# Patient Record
Sex: Female | Born: 1980 | Race: Black or African American | Hispanic: No | Marital: Married | Smoking: Never smoker
Health system: Southern US, Community
[De-identification: ages and names within clinical notes are randomized; demographics above are authoritative.]

## PROBLEM LIST (undated history)

## (undated) DIAGNOSIS — M419 Scoliosis, unspecified: Secondary | ICD-10-CM

## (undated) DIAGNOSIS — R000 Tachycardia, unspecified: Secondary | ICD-10-CM

## (undated) DIAGNOSIS — G40909 Epilepsy, unspecified, not intractable, without status epilepticus: Secondary | ICD-10-CM

## (undated) DIAGNOSIS — D219 Benign neoplasm of connective and other soft tissue, unspecified: Secondary | ICD-10-CM

## (undated) HISTORY — PX: GALLBLADDER SURGERY: SHX652

## (undated) HISTORY — PX: CHOLECYSTECTOMY: SHX55

## (undated) HISTORY — DX: Benign neoplasm of connective and other soft tissue, unspecified: D21.9

---

## 2004-07-18 ENCOUNTER — Emergency Department: Payer: Self-pay | Admitting: Emergency Medicine

## 2005-05-22 ENCOUNTER — Emergency Department: Payer: Self-pay | Admitting: Emergency Medicine

## 2007-02-22 ENCOUNTER — Emergency Department: Payer: Self-pay | Admitting: Internal Medicine

## 2007-02-27 ENCOUNTER — Emergency Department: Payer: Self-pay | Admitting: Unknown Physician Specialty

## 2007-02-28 ENCOUNTER — Inpatient Hospital Stay: Payer: Self-pay | Admitting: Obstetrics and Gynecology

## 2007-05-13 ENCOUNTER — Encounter: Payer: Self-pay | Admitting: Maternal and Fetal Medicine

## 2007-06-06 ENCOUNTER — Encounter: Payer: Self-pay | Admitting: Maternal & Fetal Medicine

## 2007-06-08 ENCOUNTER — Observation Stay: Payer: Self-pay | Admitting: Obstetrics and Gynecology

## 2007-07-17 ENCOUNTER — Observation Stay: Payer: Self-pay | Admitting: Unknown Physician Specialty

## 2007-07-18 ENCOUNTER — Observation Stay: Payer: Self-pay | Admitting: Obstetrics & Gynecology

## 2007-08-02 ENCOUNTER — Observation Stay: Payer: Self-pay

## 2007-08-23 ENCOUNTER — Observation Stay: Payer: Self-pay

## 2007-09-17 ENCOUNTER — Inpatient Hospital Stay: Payer: Self-pay

## 2007-11-27 ENCOUNTER — Emergency Department: Payer: Self-pay | Admitting: Emergency Medicine

## 2008-03-27 ENCOUNTER — Emergency Department: Payer: Self-pay | Admitting: Emergency Medicine

## 2008-09-17 ENCOUNTER — Emergency Department: Payer: Self-pay | Admitting: Emergency Medicine

## 2008-11-02 ENCOUNTER — Ambulatory Visit: Payer: Self-pay | Admitting: Family Medicine

## 2009-02-06 ENCOUNTER — Emergency Department: Payer: Self-pay | Admitting: Emergency Medicine

## 2009-04-05 ENCOUNTER — Emergency Department: Payer: Self-pay | Admitting: Emergency Medicine

## 2009-06-27 ENCOUNTER — Observation Stay: Payer: Self-pay

## 2009-07-19 ENCOUNTER — Observation Stay: Payer: Self-pay | Admitting: Obstetrics & Gynecology

## 2009-08-05 ENCOUNTER — Observation Stay: Payer: Self-pay

## 2009-08-13 ENCOUNTER — Ambulatory Visit: Payer: Self-pay | Admitting: Oncology

## 2009-08-17 ENCOUNTER — Observation Stay: Payer: Self-pay | Admitting: Obstetrics & Gynecology

## 2009-08-18 ENCOUNTER — Ambulatory Visit: Payer: Self-pay

## 2009-08-19 ENCOUNTER — Observation Stay: Payer: Self-pay

## 2009-08-24 ENCOUNTER — Observation Stay: Payer: Self-pay

## 2009-08-30 ENCOUNTER — Observation Stay: Payer: Self-pay

## 2009-09-02 ENCOUNTER — Ambulatory Visit: Payer: Self-pay | Admitting: Oncology

## 2009-09-03 ENCOUNTER — Observation Stay: Payer: Self-pay

## 2009-09-13 ENCOUNTER — Ambulatory Visit: Payer: Self-pay | Admitting: Oncology

## 2009-09-16 ENCOUNTER — Observation Stay: Payer: Self-pay | Admitting: Obstetrics & Gynecology

## 2009-09-22 ENCOUNTER — Inpatient Hospital Stay: Payer: Self-pay

## 2009-09-25 ENCOUNTER — Ambulatory Visit: Payer: Self-pay | Admitting: Oncology

## 2009-10-14 ENCOUNTER — Ambulatory Visit: Payer: Self-pay | Admitting: Oncology

## 2010-07-29 ENCOUNTER — Ambulatory Visit: Payer: Self-pay | Admitting: Internal Medicine

## 2010-10-05 ENCOUNTER — Emergency Department: Payer: Self-pay | Admitting: *Deleted

## 2011-01-18 ENCOUNTER — Emergency Department: Payer: Self-pay | Admitting: Emergency Medicine

## 2011-02-15 ENCOUNTER — Ambulatory Visit: Payer: Self-pay | Admitting: Surgery

## 2011-02-15 LAB — BASIC METABOLIC PANEL
Anion Gap: 12 (ref 7–16)
BUN: 8 mg/dL (ref 7–18)
Calcium, Total: 9.3 mg/dL (ref 8.5–10.1)
EGFR (African American): >60
EGFR (Non-African Amer.): >60
Glucose: 80 mg/dL (ref 65–99)
Osmolality: 284 (ref 275–301)
Sodium: 144 mmol/L (ref 136–145)

## 2011-02-15 LAB — CBC WITH DIFFERENTIAL/PLATELET
Basophil %: 0.8 %
Eosinophil #: 0 10*3/uL (ref 0.0–0.7)
Eosinophil %: 0.2 %
HCT: 33.9 % — ABNORMAL LOW (ref 35.0–47.0)
HGB: 11.3 g/dL — ABNORMAL LOW (ref 12.0–16.0)
MCH: 28.4 pg (ref 26.0–34.0)
MCV: 85 fL (ref 80–100)
Monocyte %: 5.5 %
Neutrophil #: 4.4 10*3/uL (ref 1.4–6.5)
Platelet: 231 10*3/uL (ref 150–440)
RBC: 3.99 10*6/uL (ref 3.80–5.20)
RDW: 13.1 % (ref 11.5–14.5)
WBC: 6.1 10*3/uL (ref 3.6–11.0)

## 2011-02-15 LAB — PREGNANCY, URINE: Pregnancy Test, Urine: NEGATIVE m[IU]/mL

## 2011-02-21 ENCOUNTER — Ambulatory Visit: Payer: Self-pay | Admitting: Surgery

## 2011-02-22 LAB — PATHOLOGY REPORT

## 2011-06-08 ENCOUNTER — Ambulatory Visit: Payer: Self-pay

## 2011-06-11 ENCOUNTER — Emergency Department: Payer: Self-pay | Admitting: Emergency Medicine

## 2011-06-11 LAB — URINALYSIS, COMPLETE
Glucose,UR: NEGATIVE mg/dL (ref 0–75)
Leukocyte Esterase: NEGATIVE
Nitrite: NEGATIVE
Ph: 5 (ref 4.5–8.0)
Protein: NEGATIVE
RBC,UR: 1 /HPF (ref 0–5)
Specific Gravity: 1.029 (ref 1.003–1.030)
Squamous Epithelial: NONE SEEN

## 2011-06-11 LAB — COMPREHENSIVE METABOLIC PANEL
Albumin: 4.3 g/dL (ref 3.4–5.0)
Alkaline Phosphatase: 48 U/L — ABNORMAL LOW (ref 50–136)
Anion Gap: 6 — ABNORMAL LOW (ref 7–16)
BUN: 13 mg/dL (ref 7–18)
Bilirubin,Total: 0.4 mg/dL (ref 0.2–1.0)
Calcium, Total: 9.2 mg/dL (ref 8.5–10.1)
Chloride: 105 mmol/L (ref 98–107)
Creatinine: 0.89 mg/dL (ref 0.60–1.30)
EGFR (African American): >60
EGFR (Non-African Amer.): >60
Glucose: 84 mg/dL (ref 65–99)
Osmolality: 275 (ref 275–301)
Potassium: 4.2 mmol/L (ref 3.5–5.1)
SGOT(AST): 19 U/L (ref 15–37)
SGPT (ALT): 13 U/L
Sodium: 138 mmol/L (ref 136–145)
Total Protein: 8.5 g/dL — ABNORMAL HIGH (ref 6.4–8.2)

## 2011-06-11 LAB — CBC
MCH: 27.6 pg (ref 26.0–34.0)
MCHC: 31.7 g/dL — ABNORMAL LOW (ref 32.0–36.0)
Platelet: 302 10*3/uL (ref 150–440)
RBC: 4.66 10*6/uL (ref 3.80–5.20)
WBC: 5.9 10*3/uL (ref 3.6–11.0)

## 2011-06-11 LAB — LIPASE, BLOOD: Lipase: 191 U/L (ref 73–393)

## 2011-06-11 LAB — PREGNANCY, URINE: Pregnancy Test, Urine: NEGATIVE m[IU]/mL

## 2011-07-12 ENCOUNTER — Emergency Department: Payer: Self-pay | Admitting: Emergency Medicine

## 2011-07-12 LAB — CBC
HCT: 36.8 % (ref 35.0–47.0)
HGB: 12.1 g/dL (ref 12.0–16.0)
MCH: 28.2 pg (ref 26.0–34.0)
MCHC: 33 g/dL (ref 32.0–36.0)
MCV: 86 fL (ref 80–100)
Platelet: 288 10*3/uL (ref 150–440)
WBC: 4.3 10*3/uL (ref 3.6–11.0)

## 2011-07-12 LAB — COMPREHENSIVE METABOLIC PANEL
Albumin: 4 g/dL (ref 3.4–5.0)
Alkaline Phosphatase: 48 U/L — ABNORMAL LOW (ref 50–136)
Anion Gap: 7 (ref 7–16)
Calcium, Total: 9 mg/dL (ref 8.5–10.1)
Co2: 26 mmol/L (ref 21–32)
Creatinine: 0.93 mg/dL (ref 0.60–1.30)
Glucose: 97 mg/dL (ref 65–99)
Osmolality: 277 (ref 275–301)
Potassium: 4.3 mmol/L (ref 3.5–5.1)
Sodium: 139 mmol/L (ref 136–145)
Total Protein: 8 g/dL (ref 6.4–8.2)

## 2011-07-12 LAB — URINALYSIS, COMPLETE
Bilirubin,UR: NEGATIVE
Glucose,UR: NEGATIVE mg/dL (ref 0–75)
Leukocyte Esterase: NEGATIVE
Nitrite: NEGATIVE
Protein: NEGATIVE
Specific Gravity: 1.029 (ref 1.003–1.030)
WBC UR: 1 /HPF (ref 0–5)

## 2011-07-12 LAB — CK TOTAL AND CKMB (NOT AT ARMC)
CK, Total: 84 U/L (ref 21–215)
CK-MB: <0.5 ng/mL — ABNORMAL LOW (ref 0.5–3.6)

## 2011-07-12 LAB — TROPONIN I: Troponin-I: <0.02 ng/mL

## 2012-05-02 ENCOUNTER — Ambulatory Visit: Payer: Self-pay

## 2012-05-02 LAB — URINALYSIS, COMPLETE
Bilirubin,UR: NEGATIVE
Blood: NEGATIVE
Glucose,UR: NEGATIVE mg/dL (ref 0–75)
Ketone: NEGATIVE
Leukocyte Esterase: NEGATIVE
Nitrite: NEGATIVE

## 2012-05-02 LAB — PREGNANCY, URINE: Pregnancy Test, Urine: POSITIVE m[IU]/mL

## 2012-08-17 ENCOUNTER — Ambulatory Visit: Payer: Self-pay

## 2012-08-17 LAB — URINALYSIS, COMPLETE
Glucose,UR: NEGATIVE mg/dL (ref 0–75)
Leukocyte Esterase: NEGATIVE
Nitrite: NEGATIVE
Protein: NEGATIVE
Specific Gravity: 1.01 (ref 1.003–1.030)

## 2012-08-19 LAB — URINE CULTURE

## 2012-08-31 ENCOUNTER — Observation Stay: Payer: Self-pay

## 2012-08-31 LAB — URINALYSIS, COMPLETE
Bacteria: NONE SEEN
Blood: NEGATIVE
Glucose,UR: NEGATIVE mg/dL (ref 0–75)
Ph: 7 (ref 4.5–8.0)
RBC,UR: <1 /HPF (ref 0–5)
Specific Gravity: 1.005 (ref 1.003–1.030)
Squamous Epithelial: 2
WBC UR: <1 /HPF (ref 0–5)

## 2012-10-20 ENCOUNTER — Observation Stay: Payer: Self-pay

## 2012-10-20 LAB — URINALYSIS, COMPLETE
Blood: NEGATIVE
Ketone: NEGATIVE
Leukocyte Esterase: NEGATIVE
Ph: 7 (ref 4.5–8.0)
RBC,UR: NONE SEEN /HPF (ref 0–5)
Squamous Epithelial: 1

## 2012-10-21 LAB — FETAL FIBRONECTIN
Appearance: NORMAL
Fetal Fibronectin: NEGATIVE

## 2012-11-15 ENCOUNTER — Observation Stay: Payer: Self-pay | Admitting: Obstetrics and Gynecology

## 2012-12-09 ENCOUNTER — Observation Stay: Payer: Self-pay | Admitting: Obstetrics and Gynecology

## 2012-12-10 ENCOUNTER — Inpatient Hospital Stay: Payer: Self-pay

## 2012-12-10 LAB — CBC WITH DIFFERENTIAL/PLATELET
Eosinophil: 1 %
HGB: 11.8 g/dL — ABNORMAL LOW (ref 12.0–16.0)
Lymphocytes: 15 %
MCH: 29.4 pg (ref 26.0–34.0)
MCHC: 34.5 g/dL (ref 32.0–36.0)
MCV: 85 fL (ref 80–100)
Monocytes: 6 %
Platelet: 217 10*3/uL (ref 150–440)
RBC: 4.01 10*6/uL (ref 3.80–5.20)
RDW: 13.3 % (ref 11.5–14.5)
Segmented Neutrophils: 71 %
WBC: 10.4 10*3/uL (ref 3.6–11.0)

## 2012-12-11 LAB — HEMATOCRIT: HCT: 30.3 % — ABNORMAL LOW (ref 35.0–47.0)

## 2013-01-01 ENCOUNTER — Ambulatory Visit: Payer: Self-pay | Admitting: Family Medicine

## 2014-01-18 ENCOUNTER — Emergency Department: Payer: Self-pay | Admitting: Emergency Medicine

## 2014-01-18 LAB — CBC WITH DIFFERENTIAL/PLATELET
Basophil #: 0 10*3/uL (ref 0.0–0.1)
Basophil %: 0.6 %
EOS PCT: 1.5 %
Eosinophil #: 0.1 10*3/uL (ref 0.0–0.7)
HCT: 38.6 % (ref 35.0–47.0)
HGB: 12.6 g/dL (ref 12.0–16.0)
LYMPHS PCT: 36.8 %
Lymphocyte #: 2.3 10*3/uL (ref 1.0–3.6)
MCH: 27.9 pg (ref 26.0–34.0)
MCHC: 32.7 g/dL (ref 32.0–36.0)
MCV: 85 fL (ref 80–100)
Monocyte #: 0.6 x10 3/mm (ref 0.2–0.9)
Monocyte %: 9.2 %
Neutrophil #: 3.2 10*3/uL (ref 1.4–6.5)
Neutrophil %: 51.9 %
Platelet: 284 10*3/uL (ref 150–440)
RBC: 4.53 10*6/uL (ref 3.80–5.20)
RDW: 13.1 % (ref 11.5–14.5)
WBC: 6.1 10*3/uL (ref 3.6–11.0)

## 2014-01-18 LAB — URINALYSIS, COMPLETE
BILIRUBIN, UR: NEGATIVE
Blood: NEGATIVE
GLUCOSE, UR: NEGATIVE mg/dL (ref 0–75)
Ketone: NEGATIVE
Nitrite: NEGATIVE
PROTEIN: NEGATIVE
Ph: 6 (ref 4.5–8.0)
SPECIFIC GRAVITY: 1.025 (ref 1.003–1.030)
Squamous Epithelial: 12

## 2014-01-18 LAB — COMPREHENSIVE METABOLIC PANEL
Albumin: 4.2 g/dL (ref 3.4–5.0)
Alkaline Phosphatase: 96 U/L
Anion Gap: 7 (ref 7–16)
BILIRUBIN TOTAL: 0.3 mg/dL (ref 0.2–1.0)
BUN: 18 mg/dL (ref 7–18)
Calcium, Total: 9.4 mg/dL (ref 8.5–10.1)
Chloride: 103 mmol/L (ref 98–107)
Co2: 27 mmol/L (ref 21–32)
Creatinine: 1.14 mg/dL (ref 0.60–1.30)
EGFR (African American): >60
GFR CALC NON AF AMER: 58 — AB
GLUCOSE: 94 mg/dL (ref 65–99)
Osmolality: 275 (ref 275–301)
Potassium: 3.9 mmol/L (ref 3.5–5.1)
SGOT(AST): 21 U/L (ref 15–37)
SGPT (ALT): 20 U/L
Sodium: 137 mmol/L (ref 136–145)
Total Protein: 8.7 g/dL — ABNORMAL HIGH (ref 6.4–8.2)

## 2014-01-20 LAB — URINE CULTURE

## 2014-02-27 ENCOUNTER — Emergency Department: Payer: Self-pay | Admitting: Emergency Medicine

## 2014-06-07 NOTE — Op Note (Signed)
PATIENT NAME:  Alexandra Heath, Alexandra Heath MR#:  [id number] DATE OF BIRTH:  December 19, 1980  DATE OF PROCEDURE:  02/21/2011  PREOPERATIVE DIAGNOSIS: Symptomatic cholelithiasis.   POSTOPERATIVE DIAGNOSIS: Symptomatic cholelithiasis.   PROCEDURE: Laparoscopic cholecystectomy.   SURGEON: Phoebe Perch, MD   ANESTHESIA: General with endotracheal tube.   INDICATIONS: This is a patient with recurrent episodes of right upper quadrant pain associated with fatty food intolerance and work-up showing gallstones. Preoperatively, we discussed the rationale for surgery, the options of observation, risks of bleeding, infection, recurrence of symptoms, failure to resolve her symptoms, open procedure, bile duct damage, bile duct leak, retained common bile duct stone, any of which could require further surgery and/or ERCP stent and papillotomy. This was all reviewed for her in the preop holding area in the presence of her family. She understood and agreed to proceed. Questions were answered.   FINDINGS: Multiple tiny gallstones and scar on the anterior surface of the gallbladder.   DESCRIPTION OF PROCEDURE: The patient was induced to general anesthesia. She was given IV antibiotics, and she was prepped and draped in a sterile fashion. Marcaine was infiltrated in the skin and subcutaneous tissues around the periumbilical area. Incision was made. A Veress needle was placed. Pneumoperitoneum was obtained, and a 5 mm trocar port was placed. The abdominal cavity was explored, and under direct vision a 10 mm epigastric port and two lateral 5 mm ports were placed. The gallbladder was identified, placed on tension. Adhesions were taken down sharply and bluntly without the use of energy. The peritoneum over the infundibulum was incised bluntly. The cystic duct-gallbladder junction was well identified. Cystic lymphatics were doubly clipped and divided. Further dissection revealed the cystic artery, which was doubly clipped and divided. This  allowed for good visualization of the cystic duct as it entered the infundibulum. Stones were milked out of the narrow portion of the infundibulum back up into the gallbladder; and here the cystic duct was doubly clipped and divided, and the gallbladder was taken from the gallbladder fossa with electrocautery and passed out through the epigastric port site with the aid of an EndoCatch bag. The area was checked for hemostasis. There was no sign of bile leak, bleeding or bowel injury. The camera was placed in the epigastric site. Again, there was no sign of bowel injury. Therefore, pneumoperitoneum was released. All ports were removed. Fascial edges at the epigastric site were approximated with figure-of-eight 0 Vicryl, 4-0 subcuticular Monocryl was used at all skin edges. Steri-Strips, Mastisol, and sterile dressings were placed.   The patient tolerated the procedure well. There were no complications. She was taken to the recovery room in stable condition to be discharged in the care of her family. Follow-up in 10 days.   ____________________________ Jerrol Banana Burt Knack, MD rec:cbb D: 02/21/2011 11:05:20 ET T: 02/21/2011 12:42:38 ET JOB#: [id number]  cc: Jerrol Banana. Burt Knack, MD, <Dictator> Florene Glen MD ELECTRONICALLY SIGNED 02/21/2011 17:22

## 2014-06-07 NOTE — H&P (Signed)
PATIENT NAME:  Alexandra Heath, Alexandra Heath MR#:  [id number] DATE OF BIRTH:  [birth year]  DATE OF ADMISSION:  02/21/2011  CHIEF COMPLAINT: Right upper quadrant pain.   HISTORY OF PRESENT ILLNESS: This is a patient who has been in the emergency room. She has had frequent attacks of right upper quadrant pain associated with fatty food intolerance and she has had some back pain. She has had nausea but no emesis and no jaundice or acholic stools, no fevers or chills. She was seen in the office several weeks ago and plan was for elective laparoscopic cholecystectomy.   PAST MEDICAL HISTORY: Scoliosis.   PAST SURGICAL HISTORY: None.   ALLERGIES: No known drug allergies.   MEDICATIONS: None.   FAMILY HISTORY: Gallbladder disease.  SOCIAL HISTORY: She does not smoke or drink.   REVIEW OF SYSTEMS:  A 10 system review has been performed and negative with the exception of that mentioned in the history of present illness. See office note.   PHYSICAL EXAMINATION:   GENERAL: Healthy-appearing female patient.   HEENT: No scleral icterus.   NECK: No palpable neck nodes.   CHEST: Clear to auscultation.   CARDIAC: Regular rate and rhythm.   ABDOMEN: Soft, nontender.   EXTREMITIES: No edema. Calves are nontender.   NEUROLOGIC: Grossly intact.   INTEGUMENT: No jaundice.   LABS: Laboratory values show normal LFTs.   ASSESSMENT AND PLAN: This is a patient with symptomatic cholelithiasis. I recommend laparoscopic cholecystectomy for control of her symptoms. The options of observation have been reviewed and the risks of bleeding, infection, recurrence of symptoms, failure to resolve her symptoms, open procedure, bile duct damage, bile duct leak, and retained common bile duct stone any of which could require further surgery and/or ERCP, stent, and papillotomy were all reviewed for her. She understood and agreed to proceed.  ____________________________ Jerrol Banana Burt Knack, MD rec:slb D: 02/20/2011 15:23:44  ET T: 02/20/2011 16:23:31 ET JOB#: [id number]  cc: Jerrol Banana. Burt Knack, MD, <Dictator> Florene Glen MD ELECTRONICALLY SIGNED 02/21/2011 17:22

## 2014-06-13 ENCOUNTER — Ambulatory Visit
Admit: 2014-06-13 | Disposition: A | Discharge status: Home / Self Care | Payer: Self-pay | Attending: Physical Medicine and Rehabilitation | Admitting: Physical Medicine and Rehabilitation

## 2014-06-15 DIAGNOSIS — M62830 Muscle spasm of back: Secondary | ICD-10-CM | POA: Insufficient documentation

## 2014-06-15 DIAGNOSIS — M4120 Other idiopathic scoliosis, site unspecified: Secondary | ICD-10-CM | POA: Insufficient documentation

## 2014-06-23 NOTE — H&P (Signed)
L&D Evaluation:  History:  HPI 34 y/o G4P2012 @ 37wks [facility] 12/31/12 sent from [facility] office this am with strong contractions. Denies leaking fluid or vaginal bleeding, baby is active. HX PTD @ 36wks IOL IUGR 5#12oz. Scoliosis, Low prepregnancy weight, Cardiac palpitations propanalol 5mg  BID (normal holter prior to pregnancy) GBS negative   Presents with contractions   Patient's Medical History above   Patient's Surgical History none   Medications Pre Natal Vitamins  Iron  Proanalol 5mg  BID   Allergies NKDA   Social History none   Family History Non-Contributory   ROS:  ROS All systems were reviewed.  HEENT, CNS, GI, GU, Respiratory, CV, Renal and Musculoskeletal systems were found to be normal.   Exam:  Vital Signs stable   Urine Protein not completed   General no apparent distress, remaining teeth in poor repair   Mental Status clear   Chest clear   Heart normal sinus rhythm   Abdomen gravid, non-tender   Estimated Fetal Weight Average for gestational age   Fetal Position vtx   Fundal Height appropriate   Back no CVAT   Edema no edema   Reflexes 1+   Clonus negative   Pelvic no external lesions, 1-2cm admission progressed to 3cm 50% vtx @ -2 cx post nl show BBOW   Mebranes Intact   FHT 130's 140's avg variability with accels, occasional varable decel @ peak of uc's down from baseline to 90's x 30-45 sec   FHT Description Variable decelerations   Ucx irregular, Uc's Q 2-10 irregular 45 sec up to 60 sec  mo str   Skin dry   Lymph no lymphadenopathy   Impression:  Impression early labor, 37weeks early labor variable decels   Plan:  Plan EFM/NST, monitor contractions and for cervical change   Comments Admitted, knows what to expect. Will begin gentle pitocin augment, plans epidural with labor progress (has had with prior labors). Husband at bedside, supportive.   Electronic Signatures: Rosie Fate (CNM)  (Signed 28-Oct-14 13:15)  Authored:  L&D Evaluation   Last Updated: 28-Oct-14 13:15 by Rosie Fate (CNM)

## 2014-06-25 ENCOUNTER — Other Ambulatory Visit: Payer: Self-pay | Admitting: Physician Assistant

## 2014-06-25 DIAGNOSIS — M7989 Other specified soft tissue disorders: Secondary | ICD-10-CM

## 2014-06-25 DIAGNOSIS — M799 Soft tissue disorder, unspecified: Secondary | ICD-10-CM

## 2014-07-04 ENCOUNTER — Ambulatory Visit: Admission: RE | Admit: 2014-07-04 | Payer: Medicaid Other | Source: Ambulatory Visit

## 2014-09-10 DIAGNOSIS — G629 Polyneuropathy, unspecified: Secondary | ICD-10-CM | POA: Insufficient documentation

## 2014-09-21 ENCOUNTER — Ambulatory Visit
Admission: EM | Admit: 2014-09-21 | Discharge: 2014-09-21 | Disposition: A | Discharge status: Home / Self Care | Payer: Medicaid Other | Attending: Family Medicine | Admitting: Family Medicine

## 2014-09-21 ENCOUNTER — Encounter: Payer: Self-pay | Admitting: Emergency Medicine

## 2014-09-21 DIAGNOSIS — Z79899 Other long term (current) drug therapy: Secondary | ICD-10-CM | POA: Insufficient documentation

## 2014-09-21 DIAGNOSIS — K911 Postgastric surgery syndromes: Secondary | ICD-10-CM | POA: Diagnosis not present

## 2014-09-21 DIAGNOSIS — R109 Unspecified abdominal pain: Secondary | ICD-10-CM | POA: Diagnosis present

## 2014-09-21 DIAGNOSIS — K3189 Other diseases of stomach and duodenum: Secondary | ICD-10-CM | POA: Insufficient documentation

## 2014-09-21 HISTORY — DX: Scoliosis, unspecified: M41.9

## 2014-09-21 HISTORY — DX: Tachycardia, unspecified: R00.0

## 2014-09-21 LAB — URINALYSIS COMPLETE WITH MICROSCOPIC (ARMC ONLY)
BILIRUBIN URINE: NEGATIVE
Glucose, UA: 100 mg/dL — AB
HGB URINE DIPSTICK: NEGATIVE
KETONES UR: NEGATIVE mg/dL
Leukocytes, UA: NEGATIVE
Nitrite: NEGATIVE
PH: 5.5 (ref 5.0–8.0)
PROTEIN: NEGATIVE mg/dL
RBC / HPF: NONE SEEN RBC/hpf (ref <3)
Specific Gravity, Urine: 1.03 (ref 1.005–1.030)

## 2014-09-21 LAB — CBC WITH DIFFERENTIAL/PLATELET
BASOS ABS: 0 10*3/uL (ref 0–0.1)
BASOS PCT: 1 %
EOS ABS: 0.1 10*3/uL (ref 0–0.7)
Eosinophils Relative: 2 %
HEMATOCRIT: 36.2 % (ref 35.0–47.0)
Hemoglobin: 12.1 g/dL (ref 12.0–16.0)
Lymphocytes Relative: 42 %
Lymphs Abs: 2.3 10*3/uL (ref 1.0–3.6)
MCH: 27.9 pg (ref 26.0–34.0)
MCHC: 33.4 g/dL (ref 32.0–36.0)
MCV: 83.5 fL (ref 80.0–100.0)
Monocytes Absolute: 0.6 10*3/uL (ref 0.2–0.9)
Monocytes Relative: 10 %
Neutro Abs: 2.5 10*3/uL (ref 1.4–6.5)
Neutrophils Relative %: 45 %
Platelets: 323 10*3/uL (ref 150–440)
RBC: 4.34 MIL/uL (ref 3.80–5.20)
RDW: 13.2 % (ref 11.5–14.5)
WBC: 5.5 10*3/uL (ref 3.6–11.0)

## 2014-09-21 LAB — COMPREHENSIVE METABOLIC PANEL
ALK PHOS: 58 U/L (ref 38–126)
ALT: 13 U/L — AB (ref 14–54)
ANION GAP: 7 (ref 5–15)
AST: 18 U/L (ref 15–41)
Albumin: 4.2 g/dL (ref 3.5–5.0)
BILIRUBIN TOTAL: 0.3 mg/dL (ref 0.3–1.2)
BUN: 18 mg/dL (ref 6–20)
CHLORIDE: 102 mmol/L (ref 101–111)
CO2: 30 mmol/L (ref 22–32)
CREATININE: 1.1 mg/dL — AB (ref 0.44–1.00)
Calcium: 9.6 mg/dL (ref 8.9–10.3)
GFR calc Af Amer: >60 mL/min (ref >60)
GFR calc non Af Amer: >60 mL/min (ref >60)
Glucose, Bld: 105 mg/dL — ABNORMAL HIGH (ref 65–99)
POTASSIUM: 4 mmol/L (ref 3.5–5.1)
Sodium: 139 mmol/L (ref 135–145)
TOTAL PROTEIN: 8.2 g/dL — AB (ref 6.5–8.1)

## 2014-09-21 LAB — PREGNANCY, URINE: PREG TEST UR: NEGATIVE

## 2014-09-21 NOTE — ED Notes (Signed)
Abdominal pains lower quadrant for 1 day.

## 2014-09-21 NOTE — ED Provider Notes (Signed)
CSN: [id number]     Arrival date & time 09/21/14  1849 History   First MD Initiated Contact with Patient 09/21/14 1913     Chief Complaint  Patient presents with  . Abdominal Pain    lower quadrant   (Consider location/radiation/quality/duration/timing/severity/associated sxs/prior Treatment) HPI Comments: 34 yo female with a 2 days h/o abdominal cramping that started after eating pizza and wings on Saturday. States she's had similar episodes after her cholecystectomy and was diagnosed with dumping syndrome. However, states yesterday morning felt better, later in the day ate baked chicken and her symptoms recurred and continued today. Denies any fevers, chills, vomiting, melena, hematochezia. Crampy pain is on the lower abdomen.   Patient is a 34 y.o. female presenting with abdominal pain. The history is provided by the patient.  Abdominal Pain   Past Medical History  Diagnosis Date  . Rapid heart beat   . Scoliosis    Past Surgical History  Procedure Laterality Date  . Gallbladder surgery     No family history on file. History  Substance Use Topics  . Smoking status: Never Smoker   . Smokeless tobacco: Never Used  . Alcohol Use: No   OB History    No data available     Review of Systems  Gastrointestinal: Positive for abdominal pain.    Allergies  Review of patient's allergies indicates no known allergies.  Home Medications   Prior to Admission medications   Medication Sig Start Date End Date Taking? Authorizing Provider  propranolol (INDERAL) 20 MG/5ML solution Take by mouth 3 (three) times daily.   Yes Historical Provider, MD   BP 121/68 mmHg  Pulse 92  Temp(Src) 97.8 F (36.6 C) (Tympanic)  Resp 16  Ht 5\' 6"  (1.676 m)  Wt 124 lb (56.246 kg)  BMI 20.02 kg/m2  SpO2 100% Physical Exam  Constitutional: She appears well-developed and well-nourished. No distress.  Cardiovascular: Normal rate, regular rhythm and normal heart sounds.   Pulmonary/Chest: Effort  normal and breath sounds normal. No respiratory distress. She has no wheezes. She has no rales.  Abdominal: Soft. Bowel sounds are normal. She exhibits no distension and no mass. There is tenderness (mild lower abdominal  diffuse tenderness to palpation; not localized to one area; no rebound or guarding). There is no rebound and no guarding.  Neurological: She is alert.  Skin: No rash noted. She is not diaphoretic.  Nursing note and vitals reviewed.   ED Course  Procedures (including critical care time) Labs Review Labs Reviewed  URINALYSIS COMPLETEWITH MICROSCOPIC ([facility] ONLY) - Abnormal; Notable for the following:    APPearance CLOUDY (*)    Glucose, UA 100 (*)    Bacteria, UA FEW (*)    Squamous Epithelial / LPF TOO NUMEROUS TO COUNT (*)    All other components within normal limits  COMPREHENSIVE METABOLIC PANEL - Abnormal; Notable for the following:    Glucose, Bld 105 (*)    Creatinine, Ser 1.10 (*)    Total Protein 8.2 (*)    ALT 13 (*)    All other components within normal limits  PREGNANCY, URINE  CBC WITH DIFFERENTIAL/PLATELET    Imaging Review No results found.   MDM   1. Dumping syndrome    Plan: 1. Test results and diagnosis reviewed with patient 2. Recommend supportive treatment with increased fluids, clear liquid diet then advance slowly as tolerated 3. F/u prn if symptoms worsen or don't improve    Norval Gable, MD 09/21/14 2036

## 2014-11-11 ENCOUNTER — Ambulatory Visit
Admission: EM | Admit: 2014-11-11 | Discharge: 2014-11-11 | Disposition: A | Discharge status: Home / Self Care | Payer: Medicaid Other | Attending: Family Medicine | Admitting: Family Medicine

## 2014-11-11 DIAGNOSIS — J0100 Acute maxillary sinusitis, unspecified: Secondary | ICD-10-CM | POA: Diagnosis not present

## 2014-11-11 MED ORDER — AMOXICILLIN 875 MG PO TABS: 875.0000 mg | ORAL_TABLET | Freq: Two times a day (BID) | ORAL | Status: DC

## 2014-11-11 NOTE — ED Provider Notes (Signed)
CSN: [id number]     Arrival date & time 11/11/14  1636 History   First MD Initiated Contact with Patient 11/11/14 1720     Chief Complaint  Patient presents with  . Facial Pain  . Headache   (Consider location/radiation/quality/duration/timing/severity/associated sxs/prior Treatment) Patient is a 34 y.o. female presenting with headaches and URI. The history is provided by the patient.  Headache Pain location:  Frontal Quality:  Dull Onset quality:  Gradual Duration:  5 days Timing:  Constant Chronicity:  New Associated symptoms: congestion, ear pain, facial pain and URI   Associated symptoms: no fever   Congestion:    Location:  Nasal URI Presenting symptoms: congestion, ear pain and facial pain   Presenting symptoms: no fever   Severity:  Moderate Onset quality:  Gradual Duration:  2 weeks Timing:  Constant Chronicity:  New Relieved by:  None tried Associated symptoms: headaches and sinus pain     Past Medical History  Diagnosis Date  . Rapid heart beat   . Scoliosis    Past Surgical History  Procedure Laterality Date  . Gallbladder surgery     No family history on file. Social History  Substance Use Topics  . Smoking status: Never Smoker   . Smokeless tobacco: Never Used  . Alcohol Use: No   OB History    No data available     Review of Systems  Constitutional: Negative for fever.  HENT: Positive for congestion and ear pain.   Neurological: Positive for headaches.    Allergies  Review of patient's allergies indicates no known allergies.  Home Medications   Prior to Admission medications   Medication Sig Start Date End Date Taking? Authorizing Provider  amoxicillin (AMOXIL) 875 MG tablet Take 1 tablet (875 mg total) by mouth 2 (two) times daily. 11/11/14   Norval Gable, MD  propranolol (INDERAL) 20 MG/5ML solution Take by mouth 3 (three) times daily.    Historical Provider, MD   Meds Ordered and Administered this Visit  Medications - No data to  display  BP 104/70 mmHg  Pulse 80  Temp(Src) 98.1 F (36.7 C) (Oral)  Resp 16  Ht 5\' 6"  (1.676 m)  Wt 123 lb (55.792 kg)  BMI 19.86 kg/m2  SpO2 100%  LMP 10/30/2014 No data found.   Physical Exam  Constitutional: She appears well-developed and well-nourished. No distress.  HENT:  Head: Normocephalic and atraumatic.  Right Ear: Tympanic membrane, external ear and ear canal normal.  Left Ear: Tympanic membrane, external ear and ear canal normal.  Nose: Mucosal edema and rhinorrhea present. No nose lacerations, sinus tenderness, nasal deformity, septal deviation or nasal septal hematoma. No epistaxis.  No foreign bodies. Right sinus exhibits maxillary sinus tenderness and frontal sinus tenderness. Left sinus exhibits maxillary sinus tenderness and frontal sinus tenderness.  Mouth/Throat: Uvula is midline, oropharynx is clear and moist and mucous membranes are normal. No oropharyngeal exudate.  Eyes: Conjunctivae and EOM are normal. Pupils are equal, round, and reactive to light. Right eye exhibits no discharge. Left eye exhibits no discharge. No scleral icterus.  Neck: Normal range of motion. Neck supple. No thyromegaly present.  Cardiovascular: Normal rate, regular rhythm and normal heart sounds.   Pulmonary/Chest: Effort normal and breath sounds normal. No respiratory distress. She has no wheezes. She has no rales.  Lymphadenopathy:    She has no cervical adenopathy.  Skin: She is not diaphoretic.  Nursing note and vitals reviewed.   ED Course  Procedures (including critical care time)  Labs Review Labs Reviewed - No data to display  Imaging Review No results found.   Visual Acuity Review  Right Eye Distance:   Left Eye Distance:   Bilateral Distance:    Right Eye Near:   Left Eye Near:    Bilateral Near:         MDM   1. Acute maxillary sinusitis, recurrence not specified    Discharge Medication List as of 11/11/2014  5:29 PM    START taking these  medications   Details  amoxicillin (AMOXIL) 875 MG tablet Take 1 tablet (875 mg total) by mouth 2 (two) times daily., Starting 11/11/2014, Until Discontinued, Normal      Plan: 1. diagnosis reviewed with patient 2. rx as per orders; risks, benefits, potential side effects reviewed with patient 3. Recommend supportive treatment with otc steroid nasal spray; otc analgesics 4. F/u prn if symptoms worsen or don't improve    Norval Gable, MD 11/11/14 1731

## 2014-11-11 NOTE — ED Notes (Signed)
Pt states "I had a cold last week and now I have facial pain, and my head and face feels tight/full."

## 2014-12-17 ENCOUNTER — Encounter: Payer: Self-pay | Admitting: Neurology

## 2014-12-17 ENCOUNTER — Ambulatory Visit (INDEPENDENT_AMBULATORY_CARE_PROVIDER_SITE_OTHER): Payer: Medicaid Other | Admitting: Neurology

## 2014-12-17 VITALS — BP 110/72 | HR 76 | Resp 16 | Ht 66.0 in | Wt 124.6 lb

## 2014-12-17 DIAGNOSIS — G629 Polyneuropathy, unspecified: Secondary | ICD-10-CM | POA: Diagnosis not present

## 2014-12-17 DIAGNOSIS — F418 Other specified anxiety disorders: Secondary | ICD-10-CM

## 2014-12-17 DIAGNOSIS — M4120 Other idiopathic scoliosis, site unspecified: Secondary | ICD-10-CM

## 2014-12-17 DIAGNOSIS — G4700 Insomnia, unspecified: Secondary | ICD-10-CM

## 2014-12-17 DIAGNOSIS — M419 Scoliosis, unspecified: Secondary | ICD-10-CM | POA: Insufficient documentation

## 2014-12-17 DIAGNOSIS — R29898 Other symptoms and signs involving the musculoskeletal system: Secondary | ICD-10-CM

## 2014-12-17 DIAGNOSIS — R002 Palpitations: Secondary | ICD-10-CM | POA: Insufficient documentation

## 2014-12-17 MED ORDER — GABAPENTIN 300 MG PO CAPS: 300.0000 mg | ORAL_CAPSULE | Freq: Three times a day (TID) | ORAL | Status: DC

## 2014-12-17 MED ORDER — DULOXETINE HCL 60 MG PO CPEP: 60.0000 mg | ORAL_CAPSULE | Freq: Every day | ORAL | Status: DC

## 2014-12-17 NOTE — Progress Notes (Addendum)
[facility]  PATIENT: Alexandra Heath DOB: 10-25-1980  REFERRING DOCTOR OR PCP:  Fulton Reek SOURCE: patient and records from Dr. Humphrey Rolls (Neurology, [facility])  _________________________________   HISTORICAL  CHIEF COMPLAINT:  Chief Complaint  Patient presents with  . Extremity Weakness    Alexandra Heath is here for eval of bilat leg weakness, one leg not worse than the other, onset yrs. ago, worse in the last 6 mos.  Known scoliosis and has seen ortho for same, sts. was told legs are weak due to polyneuropathy.  PCP rx'd Gabapentin and sts. this helps some times/fim    HISTORY OF PRESENT ILLNESS:  I had the pleasure of seeing your patient, Alexandra Heath, at [facility] for neurologic consultation regarding her leg weakness.  She reports that has been progressively worsening over the last 6 months.    She reports that gait is worse and legs give out.   She reports that her legs gave out recent;y on the stairs.   She reports her legs shake if she walks longer distances.    She feels legs are symmetric.   She denies any difficulty with her bladder.    She also reports numbness in her legs.    She was diagnosed with scoliosis about 6 years ago but she has been told it is not bad enough for surgery.  She has some lower back pain, as well but reports no benefit from NSAIDs in the past.   She had some benefit from opiates that she took after the birth of her son but does not want to take these med's long term.    A recent NCV/EMG showed minimal sensory polyneuropathy but nothing that could not explain leg weakness.    She has a nagging discomfort that can happen day and night. The pain in the feet is a pins/nedles sensation and the leg pain is more achy.  This does not improve with leg movements.   Gabapentin 100 mg was recently started and has only helped minimally.     She has not received much benefit from exercises, massage, heat or cold.    She saw Dr.  Humphrey Rolls in [place]. A nerve conduction study reportedly showed minimal sensory polyneuropathy. An MRI of the cervical spine was ordered but she did not do the study. She had an MRI of the lumbar spine in 06/13/2014 that was essentially normal. TSH, ANA, ESR, anti-Jo 1, were normal.  She has trouble sleeping at night, with difficulty falling asleep and staying asleep.     She has noted depression since 04-13-04 after her mom died.   She has some anxiety as well.      REVIEW OF SYSTEMS: Constitutional: No fevers, chills, sweats, or change in appetite.   She has insomnia Eyes: No visual changes, double vision, eye pain Ear, nose and throat: No hearing loss, ear pain, nasal congestion, sore throat Cardiovascular: No chest pain, palpitations Respiratory: No shortness of breath at rest or with exertion.   No wheezes GastrointestinaI: No nausea, vomiting, diarrhea, abdominal pain, fecal incontinence Genitourinary: No dysuria, urinary retention or frequency.  No nocturia. Musculoskeletal: as above Integumentary: No rash, pruritus, skin lesions Neurological: as above Psychiatric: Notes depression and anxiety Endocrine: No palpitations, diaphoresis, change in appetite, change in weigh or increased thirst Hematologic/Lymphatic: No anemia, purpura, petechiae. Allergic/Immunologic: No itchy/runny eyes, nasal congestion, recent allergic reactions, rashes  ALLERGIES: No Known Allergies  HOME MEDICATIONS:  Current outpatient prescriptions:  .  gabapentin (NEURONTIN) 100 MG  capsule, Take by mouth., Disp: , Rfl:  .  propranolol (INDERAL) 20 MG/5ML solution, Take by mouth 3 (three) times daily., Disp: , Rfl:  .  amoxicillin (AMOXIL) 875 MG tablet, Take 1 tablet (875 mg total) by mouth 2 (two) times daily. (Patient not taking: Reported on 12/17/2014), Disp: 20 tablet, Rfl: 0  PAST MEDICAL HISTORY: Past Medical History  Diagnosis Date  . Rapid heart beat   . Scoliosis     PAST SURGICAL  HISTORY: Past Surgical History  Procedure Laterality Date  . Gallbladder surgery      FAMILY HISTORY: History reviewed. No pertinent family history.  SOCIAL HISTORY:  Social History   Social History  . Marital Status: Married    Spouse Name: N/A  . Number of Children: N/A  . Years of Education: N/A   Occupational History  . Not on file.   Social History Main Topics  . Smoking status: Never Smoker   . Smokeless tobacco: Never Used  . Alcohol Use: No  . Drug Use: No  . Sexual Activity: Yes   Other Topics Concern  . Not on file   Social History Narrative     PHYSICAL EXAM  Filed Vitals:   12/17/14 0942  BP: 110/72  Pulse: 76  Resp: 16  Height: 5' 6"  (1.676 m)  Weight: 124 lb 9.6 oz (56.518 kg)    Body mass index is 20.12 kg/(m^2).   General: The patient is well-developed and well-nourished and in no acute distress  Eyes:  Funduscopic exam shows normal optic discs and retinal vessels.  Neck: The neck is supple .  The neck is nontender.   Skin: Extremities are without significant edema.  Musculoskeletal:  Back is tender in mid thoracic to lower lumbar paraspinal region  Neurologic Exam  Mental status: The patient is alert and oriented x 3 at the time of the examination. The patient has apparent normal recent and remote memory, with an apparently normal attention span and concentration ability.   Speech is normal.  Cranial nerves: Extraocular movements are full. Pupils are equal, round, and reactive to light and accomodation.  Visual fields are full.  Facial symmetry is present. There is good facial sensation to soft touch bilaterally.Facial strength is normal.  Trapezius and sternocleidomastoid strength is normal. No dysarthria is noted.  The tongue is midline, and the patient has symmetric elevation of the soft palate. No obvious hearing deficits are noted.  Motor:  Muscle bulk is normal.   Tone is normal. Strength is  5 / 5 in all 4 extremities.    Sensory: Sensory testing is intact to pinprick, soft touch and vibration sensation in all 4 extremities.  Coordination: Cerebellar testing reveals good finger-nose-finger and heel-to-shin bilaterally.  Gait and station: Station is normal.   Gait is minimally arthritic. Tandem gait is minimally wide. Romberg is negative.   Reflexes: Deep tendon reflexes are symmetric and normal bilaterally.   Plantar responses are flexor.    DIAGNOSTIC DATA (LABS, IMAGING, TESTING) - I reviewed patient records, labs, notes, testing and imaging myself where available.  Lab Results  Component Value Date   WBC 5.5 09/21/2014   HGB 12.1 09/21/2014   HCT 36.2 09/21/2014   MCV 83.5 09/21/2014   PLT 323 09/21/2014      Component Value Date/Time   NA 139 09/21/2014 1938   NA 137 01/18/2014 1912   K 4.0 09/21/2014 1938   K 3.9 01/18/2014 1912   CL 102 09/21/2014 1938   CL  103 01/18/2014 1912   CO2 30 09/21/2014 1938   CO2 27 01/18/2014 1912   GLUCOSE 105* 09/21/2014 1938   GLUCOSE 94 01/18/2014 1912   BUN 18 09/21/2014 1938   BUN 18 01/18/2014 1912   CREATININE 1.10* 09/21/2014 1938   CREATININE 1.14 01/18/2014 1912   CALCIUM 9.6 09/21/2014 1938   CALCIUM 9.4 01/18/2014 1912   PROT 8.2* 09/21/2014 1938   PROT 8.7* 01/18/2014 1912   ALBUMIN 4.2 09/21/2014 1938   ALBUMIN 4.2 01/18/2014 1912   AST 18 09/21/2014 1938   AST 21 01/18/2014 1912   ALT 13* 09/21/2014 1938   ALT 20 01/18/2014 1912   ALKPHOS 58 09/21/2014 1938   ALKPHOS 96 01/18/2014 1912   BILITOT 0.3 09/21/2014 1938   BILITOT 0.3 01/18/2014 1912   GFRNONAA >60 09/21/2014 1938   GFRNONAA 58* 01/18/2014 1912   GFRNONAA >60 07/12/2011 1844   GFRAA >60 09/21/2014 1938   GFRAA >60 01/18/2014 1912   GFRAA >60 07/12/2011 1844       ASSESSMENT AND PLAN  Polyneuropathy (HCC)  Idiopathic scoliosis - Plan: MR Cervical Spine Wo Contrast  Weakness of both lower extremities - Plan: CK, MR Cervical Spine Wo  Contrast  Depression with anxiety  Insomnia     In summary, Alexandra Heath is a 34 year old woman with scoliosis, chronic back pain reporting leg weakness. Her exam shows mild scoliosis, mild mid and lower back tenderness. Her neurologic exam is fairly intact.  She also appears to have a depression with anxiety.  I do not believe that her mild scoliosis or minimal polyneuropathy could explain her intermittent leg weakness. Possibly, her legs give out when she has more pain, though she has not made that connection. However, I feel we need to rule out a cervical myelopathy with a cervical spine MRI and also assess a creatinine kinase for muscle integrity.  She has depression with decreased mood, some anxiety, and poor sleep.  I will start Cymbalta 60 mg daily as this may also help her pain. Additionally, she has sleep onset and sleep maintenance insomnia that could be due to the mood or to pain. I will increase her gabapentin to 300 mg in the morning, 300 mg in the evening and 300 at night in the hope that that can help her pain and sleep. The poor sleep and depression could also be contributing to some of her pain.  She will return to see me in 3 months or sooner if there are new or worsening neurologic symptoms.    1.    Check MRI cervical spine and CK 2.    Add duloxetine 60 mg po daily 3.    Increase gabapentin to 300 mg tid (morning, evening and bedtime) 4.    Stay active and exercise as tolerated 5.   RTC 3 months or sooner if new or worseningneurologic symptoms  Richard A. Felecia Shelling, MD, PhD 02/19/107, 3:23 AM Certified in Neurology, Clinical Neurophysiology, Sleep Medicine, Pain Medicine and Neuroimaging  [facility] [address] [place], [place] [address] [phone]

## 2014-12-18 LAB — CK: Total CK: 116 U/L (ref 24–173)

## 2014-12-21 ENCOUNTER — Telehealth: Payer: Self-pay | Admitting: *Deleted

## 2014-12-21 NOTE — Telephone Encounter (Signed)
-----   Message from Britt Bottom, MD sent at 12/21/2014 12:56 PM EST ----- Please let her know that the blood work looks good.

## 2014-12-21 NOTE — Telephone Encounter (Signed)
I have spoken with Alexandra Heath this afternoon, and per RAS, advised that labs look good.  She verbalized understanding of same/fim

## 2015-01-06 ENCOUNTER — Emergency Department
Admission: EM | Admit: 2015-01-06 | Discharge: 2015-01-06 | Disposition: A | Discharge status: Home / Self Care | Payer: Medicaid Other | Attending: Emergency Medicine | Admitting: Emergency Medicine

## 2015-01-06 DIAGNOSIS — R112 Nausea with vomiting, unspecified: Secondary | ICD-10-CM | POA: Diagnosis present

## 2015-01-06 DIAGNOSIS — A084 Viral intestinal infection, unspecified: Secondary | ICD-10-CM | POA: Insufficient documentation

## 2015-01-06 DIAGNOSIS — Z79899 Other long term (current) drug therapy: Secondary | ICD-10-CM | POA: Diagnosis not present

## 2015-01-06 HISTORY — DX: Tachycardia, unspecified: R00.0

## 2015-01-06 LAB — COMPREHENSIVE METABOLIC PANEL
ALBUMIN: 5.4 g/dL — AB (ref 3.5–5.0)
ALT: 15 U/L (ref 14–54)
AST: 23 U/L (ref 15–41)
Alkaline Phosphatase: 66 U/L (ref 38–126)
Anion gap: 12 (ref 5–15)
BUN: 22 mg/dL — AB (ref 6–20)
CO2: 21 mmol/L — ABNORMAL LOW (ref 22–32)
Calcium: 10.8 mg/dL — ABNORMAL HIGH (ref 8.9–10.3)
Chloride: 104 mmol/L (ref 101–111)
Creatinine, Ser: 1.38 mg/dL — ABNORMAL HIGH (ref 0.44–1.00)
GFR calc Af Amer: 56 mL/min — ABNORMAL LOW (ref >60)
GFR calc non Af Amer: 48 mL/min — ABNORMAL LOW (ref >60)
GLUCOSE: 129 mg/dL — AB (ref 65–99)
Potassium: 4.3 mmol/L (ref 3.5–5.1)
SODIUM: 137 mmol/L (ref 135–145)
Total Bilirubin: 1.1 mg/dL (ref 0.3–1.2)
Total Protein: 10 g/dL — ABNORMAL HIGH (ref 6.5–8.1)

## 2015-01-06 LAB — LIPASE, BLOOD: Lipase: 45 U/L (ref 11–51)

## 2015-01-06 LAB — CBC
HEMATOCRIT: 46.2 % (ref 35.0–47.0)
Hemoglobin: 15.4 g/dL (ref 12.0–16.0)
MCH: 28 pg (ref 26.0–34.0)
MCHC: 33.3 g/dL (ref 32.0–36.0)
MCV: 84 fL (ref 80.0–100.0)
Platelets: 335 10*3/uL (ref 150–440)
RBC: 5.5 MIL/uL — ABNORMAL HIGH (ref 3.80–5.20)
RDW: 13.3 % (ref 11.5–14.5)
WBC: 13.3 10*3/uL — ABNORMAL HIGH (ref 3.6–11.0)

## 2015-01-06 MED ORDER — IBUPROFEN 600 MG PO TABS
600.0000 mg | ORAL_TABLET | Freq: Once | ORAL | Status: AC
  Administered 2015-01-06: 600 mg via ORAL
  Filled 2015-01-06: qty 1

## 2015-01-06 MED ORDER — ONDANSETRON HCL 4 MG/2ML IJ SOLN
4.0000 mg | Freq: Once | INTRAMUSCULAR | Status: AC | PRN
  Administered 2015-01-06: 4 mg via INTRAVENOUS
  Filled 2015-01-06: qty 2

## 2015-01-06 MED ORDER — SODIUM CHLORIDE 0.9 % IV BOLUS (SEPSIS)
1000.0000 mL | Freq: Once | INTRAVENOUS | Status: AC
Start: 2015-01-06 — End: 2015-01-06
  Administered 2015-01-06: 1000 mL via INTRAVENOUS

## 2015-01-06 MED ORDER — ONDANSETRON HCL 4 MG PO TABS: 4.0000 mg | ORAL_TABLET | Freq: Three times a day (TID) | ORAL | Status: AC | PRN

## 2015-01-06 NOTE — ED Notes (Signed)
Pt's last BP was taken while pt is laying on her side. Pt is more comfortable on side. Pt was informed that BP will be taken again in few minutes, but pt will need to lay on back. Pt verbalized understanding.

## 2015-01-06 NOTE — ED Notes (Signed)
Pt c/o N/V/D since this morning, states her child had the same sx yesterday

## 2015-01-06 NOTE — ED Notes (Signed)
So far, pt has been able to keep down apple juice. Pt was provided with 2nd cup of apple juice per pt request.

## 2015-01-06 NOTE — Discharge Instructions (Signed)
Viral Gastroenteritis °Viral gastroenteritis is also known as stomach flu. This condition affects the stomach and intestinal tract. It can cause sudden diarrhea and vomiting. The illness typically lasts 3 to 8 days. Most people develop an immune response that eventually gets rid of the virus. While this natural response develops, the virus can make you quite ill. °CAUSES  °Many different viruses can cause gastroenteritis, such as rotavirus or noroviruses. You can catch one of these viruses by consuming contaminated food or water. You may also catch a virus by sharing utensils or other personal items with an infected person or by touching a contaminated surface. °SYMPTOMS  °The most common symptoms are diarrhea and vomiting. These problems can cause a severe loss of body fluids (dehydration) and a body salt (electrolyte) imbalance. Other symptoms may include: °· Fever. °· Headache. °· Fatigue. °· Abdominal pain. °DIAGNOSIS  °Your caregiver can usually diagnose viral gastroenteritis based on your symptoms and a physical exam. A stool sample may also be taken to test for the presence of viruses or other infections. °TREATMENT  °This illness typically goes away on its own. Treatments are aimed at rehydration. The most serious cases of viral gastroenteritis involve vomiting so severely that you are not able to keep fluids down. In these cases, fluids must be given through an intravenous line (IV). °HOME CARE INSTRUCTIONS  °· Drink enough fluids to keep your urine clear or pale yellow. Drink small amounts of fluids frequently and increase the amounts as tolerated. °· Ask your caregiver for specific rehydration instructions. °· Avoid: °¨ Foods high in sugar. °¨ Alcohol. °¨ Carbonated drinks. °¨ Tobacco. °¨ Juice. °¨ Caffeine drinks. °¨ Extremely hot or cold fluids. °¨ Fatty, greasy foods. °¨ Too much intake of anything at one time. °¨ Dairy products until 24 to 48 hours after diarrhea stops. °· You may consume probiotics.  Probiotics are active cultures of beneficial bacteria. They may lessen the amount and number of diarrheal stools in adults. Probiotics can be found in yogurt with active cultures and in supplements. °· Wash your hands well to avoid spreading the virus. °· Only take over-the-counter or prescription medicines for pain, discomfort, or fever as directed by your caregiver. Do not give aspirin to children. Antidiarrheal medicines are not recommended. °· Ask your caregiver if you should continue to take your regular prescribed and over-the-counter medicines. °· Keep all follow-up appointments as directed by your caregiver. °SEEK IMMEDIATE MEDICAL CARE IF:  °· You are unable to keep fluids down. °· You do not urinate at least once every 6 to 8 hours. °· You develop shortness of breath. °· You notice blood in your stool or vomit. This may look like coffee grounds. °· You have abdominal pain that increases or is concentrated in one small area (localized). °· You have persistent vomiting or diarrhea. °· You have a fever. °· The patient is a child younger than 3 months, and he or she has a fever. °· The patient is a child older than 3 months, and he or she has a fever and persistent symptoms. °· The patient is a child older than 3 months, and he or she has a fever and symptoms suddenly get worse. °· The patient is a baby, and he or she has no tears when crying. °MAKE SURE YOU:  °· Understand these instructions. °· Will watch your condition. °· Will get help right away if you are not doing well or get worse. °  °This information is not intended to replace   advice given to you by your health care provider. Make sure you discuss any questions you have with your health care provider.   Document Released: 01/30/2005 Document Revised: 04/24/2011 Document Reviewed: 11/16/2010 Elsevier Interactive Patient Education [employer].  Please return immediately if condition worsens. Please contact her primary physician or the  physician you were given for referral. If you have any specialist physicians involved in her treatment and plan please also contact them. Thank you for using [facility].

## 2015-01-06 NOTE — ED Provider Notes (Signed)
Time Seen: Approximately 1843 I have reviewed the triage notes  Chief Complaint: Emesis and Diarrhea   History of Present Illness: Alexandra Heath is a 34 y.o. female who presents with acute onset of nausea, vomiting, and diarrhea. Patient states she has a child at home that developed similar illness yesterday. She states that she's had persistent nausea, vomited multiple times with no blood or bile. She states her bowel movements are loose and watery without any melena or hematochezia. She denies any hematemesis. She states she's had some occasional crampy diffuse abdominal pain. She is not aware of any fever at home. She denies any syncopal episode.   Past Medical History  Diagnosis Date  . Rapid heart beat   . Scoliosis   . Scoliosis   . Tachycardia     Patient Active Problem List   Diagnosis Date Noted  . Awareness of heartbeats 12/17/2014  . Scoliosis 12/17/2014  . Leg weakness 12/17/2014  . Depression with anxiety 12/17/2014  . Insomnia 12/17/2014  . Polyneuropathy ([place]) 09/10/2014  . Back muscle spasm 06/15/2014  . Idiopathic scoliosis 06/15/2014    Past Surgical History  Procedure Laterality Date  . Gallbladder surgery      Past Surgical History  Procedure Laterality Date  . Gallbladder surgery      Current Outpatient Rx  Name  Route  Sig  Dispense  Refill  . amoxicillin (AMOXIL) 875 MG tablet   Oral   Take 1 tablet (875 mg total) by mouth 2 (two) times daily. Patient not taking: Reported on 12/17/2014   20 tablet   0   . DULoxetine (CYMBALTA) 60 MG capsule   Oral   Take 1 capsule (60 mg total) by mouth daily.   90 capsule   5   . gabapentin (NEURONTIN) 300 MG capsule   Oral   Take 1 capsule (300 mg total) by mouth 3 (three) times daily.   90 capsule   11   . propranolol (INDERAL) 20 MG/5ML solution   Oral   Take by mouth 3 (three) times daily.           Allergies:  Review of patient's allergies indicates no known allergies.  Family  History: No family history on file.  Social History: Social History  Substance Use Topics  . Smoking status: Never Smoker   . Smokeless tobacco: Never Used  . Alcohol Use: No     Review of Systems:   10 point review of systems was performed and was otherwise negative:  Constitutional: No fever Eyes: No visual disturbances ENT: No sore throat, ear pain Cardiac: No chest pain Respiratory: No shortness of breath, wheezing, or stridor Abdomen: Mild crampy intermittent abdominal pain, no vomiting, No diarrhea Endocrine: No weight loss, No night sweats Extremities: No peripheral edema, cyanosis Skin: No rashes, easy bruising Neurologic: No focal weakness, trouble with speech or swollowing Urologic: No dysuria, Hematuria, or urinary frequency   Physical Exam:  ED Triage Vitals  Enc Vitals Group     BP 01/06/15 1814 138/108 mmHg     Pulse Rate 01/06/15 1814 100     Resp 01/06/15 1814 22     Temp 01/06/15 1812 97.9 F (36.6 C)     Temp Source 01/06/15 1812 Oral     SpO2 01/06/15 1814 99 %     Weight 01/06/15 1812 120 lb (54.432 kg)     Height 01/06/15 1812 5\' 4"  (1.626 m)     Head Cir --  Peak Flow --      Pain Score 01/06/15 1916 6     Pain Loc --      Pain Edu? --      Excl. in [place]? --     General: Awake , Alert , and Oriented times 3; GCS 15 Head: Normal cephalic , atraumatic Eyes: Pupils equal , round, reactive to light Nose/Throat: No nasal drainage, patent upper airway without erythema or exudate.  Neck: Supple, Full range of motion, No anterior adenopathy or palpable thyroid masses Lungs: Clear to ascultation without wheezes , rhonchi, or rales Heart: Regular rate, regular rhythm without murmurs , gallops , or rubs Abdomen: Soft, non tender without rebound, guarding , or rigidity; bowel sounds positive and symmetric in all 4 quadrants. No organomegaly .        Extremities: 2 plus symmetric pulses. No edema, clubbing or cyanosis Neurologic: normal  ambulation, Motor symmetric without deficits, sensory intact Skin: warm, dry, no rashes   Labs:   All laboratory work was reviewed including any pertinent negatives or positives listed below:  Labs Reviewed  COMPREHENSIVE METABOLIC PANEL - Abnormal; Notable for the following:    CO2 21 (*)    Glucose, Bld 129 (*)    BUN 22 (*)    Creatinine, Ser 1.38 (*)    Calcium 10.8 (*)    Total Protein 10.0 (*)    Albumin 5.4 (*)    GFR calc non Af Amer 48 (*)    GFR calc Af Amer 56 (*)    All other components within normal limits  CBC - Abnormal; Notable for the following:    WBC 13.3 (*)    RBC 5.50 (*)    All other components within normal limits  LIPASE, BLOOD   review of laboratory work shows slightly elevated white blood cell count and a creatinine of 1.38   ED Course: Patient's stay here was uneventful and I felt given her current clinical presentation and objective findings this most likely was viral gastroenteritis. Not have any focal abdominal pain on exam or history that would indicate an acute surgical issue at this time such as acute appendicitis. Patient was given a liter of fluid here along with IV Zofran with symptomatic improvement. He was able tolerate by mouth intake and will be discharged with a prescription for Zofran. All questions and concerns were addressed at the bedside.   Assessment: Viral gastroenteritis    Plan:  Outpatient management Patient was advised to return immediately if condition worsens. Patient was advised to follow up with her primary care physician or other specialized physicians involved and in their current assessment.            Daymon Larsen, MD 01/06/15 2100

## 2015-01-06 NOTE — ED Notes (Signed)
Dr. Marcelene Butte in room

## 2015-01-06 NOTE — ED Notes (Signed)
For fluid challenge, pt was provided apple juice per pt request. Pt was given emesis bag in case she is not able to keep apple juice down.

## 2015-01-19 DIAGNOSIS — Z0271 Encounter for disability determination: Secondary | ICD-10-CM

## 2015-01-20 ENCOUNTER — Inpatient Hospital Stay: Admission: RE | Admit: 2015-01-20 | Payer: Medicaid Other | Source: Ambulatory Visit

## 2015-03-19 ENCOUNTER — Ambulatory Visit: Payer: Medicaid Other | Admitting: Neurology

## 2015-04-06 ENCOUNTER — Encounter: Payer: Self-pay | Admitting: Neurology

## 2015-04-06 ENCOUNTER — Ambulatory Visit (INDEPENDENT_AMBULATORY_CARE_PROVIDER_SITE_OTHER): Payer: BLUE CROSS/BLUE SHIELD | Admitting: Neurology

## 2015-04-06 VITALS — BP 100/70 | HR 68 | Resp 16 | Ht 64.0 in | Wt 132.8 lb

## 2015-04-06 DIAGNOSIS — F418 Other specified anxiety disorders: Secondary | ICD-10-CM | POA: Diagnosis not present

## 2015-04-06 DIAGNOSIS — R29898 Other symptoms and signs involving the musculoskeletal system: Secondary | ICD-10-CM

## 2015-04-06 DIAGNOSIS — G4700 Insomnia, unspecified: Secondary | ICD-10-CM | POA: Diagnosis not present

## 2015-04-06 DIAGNOSIS — M419 Scoliosis, unspecified: Secondary | ICD-10-CM | POA: Diagnosis not present

## 2015-04-06 DIAGNOSIS — R200 Anesthesia of skin: Secondary | ICD-10-CM | POA: Diagnosis not present

## 2015-04-06 DIAGNOSIS — M62830 Muscle spasm of back: Secondary | ICD-10-CM | POA: Diagnosis not present

## 2015-04-06 MED ORDER — CYCLOBENZAPRINE HCL 5 MG PO TABS: 5.0000 mg | ORAL_TABLET | Freq: Three times a day (TID) | ORAL | Status: DC | PRN

## 2015-04-06 MED ORDER — DULOXETINE HCL 60 MG PO CPEP: 60.0000 mg | ORAL_CAPSULE | Freq: Every day | ORAL | Status: DC

## 2015-04-06 NOTE — Progress Notes (Signed)
[facility]  PATIENT: Alexandra Heath DOB: 10-18-1980  REFERRING DOCTOR OR PCP:  Fulton Reek SOURCE: patient and records from Dr. Humphrey Rolls (Neurology, [facility])  _________________________________   HISTORICAL  CHIEF COMPLAINT:  Chief Complaint  Patient presents with  . Polyneuropathy    Sts. Gabapentin helped for a short while until she tried to work--then pain worsened.  She didn't feel Gabapentin helped, so she stopped it and followed up with her pcp who is now rx'ing Vicodin 10-362m q6h for pain.  Sts. she [facility] [place] [place] Sts. she really just wants to know where pain is coming from.  MRI C-spine wasn't done--she sts. nobody contacted her to schedule it.    HISTORY OF PRESENT ILLNESS:  Alexandra Heath a 35yo woman with LBP and leg weakness.  She reports pain is mostly in the mid-back.    Pain usually doe snot radiate.    Activities increase pain.   No position is comfortable.    She feels best when she shifts from one position to another every few minutes.     She was diagnosed with scoliosis about 6 years ago but she has been told it is not bad enough for surgery.    She had some benefit from opiates that she took after the birth of her son but does not want to take these med's long term.   Gabapentin 100 mg did not help.   300 mg po tid may have helped minimally but made her drowsy.   Duloxetine did not help much.   She has not received much benefit from exercises, massage, heat or cold.       Her legs sometimes give out.    They give out randomly, though she usually knows when it will happen.   Legs giving out is not necessarily related to pain level.  Leg strength is symmetric.    She reports her legs shake if she walks longer distances.    She denies any difficulty with her bladder.    She did not go to her MRI cervical spine  She also reports numbness in her legs, mostly in the feet and toes.     A recent NCV/EMG showed minimal sensory polyneuropathy but nothing that could not explain leg weakness.   She saw Dr. [place]Humphrey Rollsin [place] A nerve conduction study reportedly showed minimal sensory polyneuropathy. An MRI of the cervical spine was ordered but she did not do the study. She had an MRI of the lumbar spine in 06/13/2014 that was essentially normal. TSH, ANA, ESR, anti-Jo 1, were normal.      She has trouble sleeping at night, with difficulty falling asleep and staying asleep.     She has noted depression since 22006-02-20after her mom died.   She has some anxiety as well.      REVIEW OF SYSTEMS: Constitutional: No fevers, chills, sweats, or change in appetite.   She has insomnia Eyes: No visual changes, double vision, eye pain Ear, nose and throat: No hearing loss, ear pain, nasal congestion, sore throat Cardiovascular: No chest pain, palpitations Respiratory: No shortness of breath at rest or with exertion.   No wheezes GastrointestinaI: No nausea, vomiting, diarrhea, abdominal pain, fecal incontinence Genitourinary: No dysuria, urinary retention or frequency.  No nocturia. Musculoskeletal: as above Integumentary: No rash, pruritus, skin lesions Neurological: as above Psychiatric: Notes depression and anxiety Endocrine: No palpitations, diaphoresis, change in  appetite, change in weigh or increased thirst Hematologic/Lymphatic: No anemia, purpura, petechiae. Allergic/Immunologic: No itchy/runny eyes, nasal congestion, recent allergic reactions, rashes  ALLERGIES: No Known Allergies  HOME MEDICATIONS:  Current outpatient prescriptions:  .  propranolol (INDERAL) 20 MG/5ML solution, Take by mouth 3 (three) times daily., Disp: , Rfl:  .  amoxicillin (AMOXIL) 875 MG tablet, Take 1 tablet (875 mg total) by mouth 2 (two) times daily. (Patient not taking: Reported on 12/17/2014), Disp: 20 tablet, Rfl: 0 .  DULoxetine (CYMBALTA) 60 MG capsule, Take 1 capsule (60 mg total) by mouth  daily. (Patient not taking: Reported on 04/06/2015), Disp: 90 capsule, Rfl: 5 .  gabapentin (NEURONTIN) 300 MG capsule, Take 1 capsule (300 mg total) by mouth 3 (three) times daily. (Patient not taking: Reported on 04/06/2015), Disp: 90 capsule, Rfl: 11  PAST MEDICAL HISTORY: Past Medical History  Diagnosis Date  . Rapid heart beat   . Scoliosis   . Scoliosis   . Tachycardia     PAST SURGICAL HISTORY: Past Surgical History  Procedure Laterality Date  . Gallbladder surgery      FAMILY HISTORY: History reviewed. No pertinent family history.  SOCIAL HISTORY:  Social History   Social History  . Marital Status: Married    Spouse Name: N/A  . Number of Children: N/A  . Years of Education: N/A   Occupational History  . Not on file.   Social History Main Topics  . Smoking status: Never Smoker   . Smokeless tobacco: Never Used  . Alcohol Use: No  . Drug Use: No  . Sexual Activity: Yes   Other Topics Concern  . Not on file   Social History Narrative     PHYSICAL EXAM  Filed Vitals:   04/06/15 0935  BP: 100/70  Pulse: 68  Resp: 16  Height: 5' 4" (1.626 m)  Weight: 132 lb 12.8 oz (60.238 kg)    Body mass index is 22.78 kg/(m^2).   General: The patient is well-developed and well-nourished and in no acute distress  Neck: The neck is supple .  The neck is nontender.   Musculoskeletal:  Back is tender in mid thoracic > lumbar paraspinal region  Neurologic Exam  Mental status: The patient is alert and oriented x 3 at the time of the examination. The patient has apparent normal recent and remote memory, with an apparently normal attention span and concentration ability.   Speech is normal.  Cranial nerves: Extraocular movements are full. There is good facial sensation to soft touch bilaterally.Facial strength is normal.  Trapezius and sternocleidomastoid strength is normal. No dysarthria is noted.  The tongue is midline, and the patient has symmetric elevation of  the soft palate. No obvious hearing deficits are noted.  Motor:  Muscle bulk is normal.   Tone is normal. Strength is  5 / 5 in all 4 extremities.   Sensory: Sensory testing is intact to touch and vibration sensation in all 4 extremities.  Coordination: Cerebellar testing reveals good finger-nose-finger and heel-to-shin bilaterally.  Gait and station: Station is normal.   Gait is minimally arthritic. Tandem gait is wide. Romberg is negative.   Reflexes: Deep tendon reflexes are symmetric and normal bilaterally.       DIAGNOSTIC DATA (LABS, IMAGING, TESTING) - I reviewed patient records, labs, notes, testing and imaging myself where available.  Lab Results  Component Value Date   WBC 13.3* 01/06/2015   HGB 15.4 01/06/2015   HCT 46.2 01/06/2015   MCV 84.0 01/06/2015  PLT 335 01/06/2015      Component Value Date/Time   NA 137 01/06/2015 1825   NA 137 01/18/2014 1912   K 4.3 01/06/2015 1825   K 3.9 01/18/2014 1912   CL 104 01/06/2015 1825   CL 103 01/18/2014 1912   CO2 21* 01/06/2015 1825   CO2 27 01/18/2014 1912   GLUCOSE 129* 01/06/2015 1825   GLUCOSE 94 01/18/2014 1912   BUN 22* 01/06/2015 1825   BUN 18 01/18/2014 1912   CREATININE 1.38* 01/06/2015 1825   CREATININE 1.14 01/18/2014 1912   CALCIUM 10.8* 01/06/2015 1825   CALCIUM 9.4 01/18/2014 1912   PROT 10.0* 01/06/2015 1825   PROT 8.7* 01/18/2014 1912   ALBUMIN 5.4* 01/06/2015 1825   ALBUMIN 4.2 01/18/2014 1912   AST 23 01/06/2015 1825   AST 21 01/18/2014 1912   ALT 15 01/06/2015 1825   ALT 20 01/18/2014 1912   ALKPHOS 66 01/06/2015 1825   ALKPHOS 96 01/18/2014 1912   BILITOT 1.1 01/06/2015 1825   BILITOT 0.3 01/18/2014 1912   GFRNONAA 48* 01/06/2015 1825   GFRNONAA 58* 01/18/2014 1912   GFRNONAA >60 07/12/2011 1844   GFRAA 56* 01/06/2015 1825   GFRAA >60 01/18/2014 1912   GFRAA >60 07/12/2011 1844       ASSESSMENT AND PLAN  Weakness of both lower extremities - Plan: MR Cervical Spine Wo  Contrast  Numbness - Plan: MR Cervical Spine Wo Contrast  Back muscle spasm  Depression with anxiety  Insomnia  Scoliosis   1.    Check MRI cervical spine due to poor gait and legs giving out and leg numbness 2.    Re-try duloxetine 60 mg po daily.  Somnolence was likely gabapentin 3.    Cyclobenzaprine 5 mg po tid 4.    Stay active and exercise as tolerated 5.   RTC 3 months or sooner if new or worseningneurologic symptoms  Melana Hingle A. Felecia Shelling, MD, PhD 02/15/7251, 6:64 AM Certified in Neurology, Clinical Neurophysiology, Sleep Medicine, Pain Medicine and Neuroimaging  [facility] [address] [place], [place] [address] [phone]

## 2015-04-20 ENCOUNTER — Ambulatory Visit (INDEPENDENT_AMBULATORY_CARE_PROVIDER_SITE_OTHER): Payer: Self-pay

## 2015-04-20 DIAGNOSIS — R200 Anesthesia of skin: Secondary | ICD-10-CM | POA: Diagnosis not present

## 2015-04-20 DIAGNOSIS — Z0289 Encounter for other administrative examinations: Secondary | ICD-10-CM

## 2015-04-20 DIAGNOSIS — R29898 Other symptoms and signs involving the musculoskeletal system: Secondary | ICD-10-CM | POA: Diagnosis not present

## 2015-04-26 ENCOUNTER — Telehealth: Payer: Self-pay | Admitting: *Deleted

## 2015-04-26 NOTE — Telephone Encounter (Signed)
-----   Message from Britt Bottom, MD sent at 04/23/2015  3:30 PM EST ----- Please note that the MRI of the cervical spine does not show any significant findings. There is no nerve root or spinal cord compression

## 2015-04-26 NOTE — Telephone Encounter (Signed)
LMTC./fim 

## 2015-04-26 NOTE — Telephone Encounter (Signed)
I have spoken with Alexandra Heath and per RAS, advised mri c-spine showed no sig changes--no nerve root or cord compression.  She verbalized understanding of same/fim

## 2015-04-26 NOTE — Telephone Encounter (Signed)
Patient is returning your call in regard to her MRI results.  Please call.  Thanks!

## 2015-05-14 DIAGNOSIS — D219 Benign neoplasm of connective and other soft tissue, unspecified: Secondary | ICD-10-CM

## 2015-05-14 HISTORY — DX: Benign neoplasm of connective and other soft tissue, unspecified: D21.9

## 2015-05-17 ENCOUNTER — Ambulatory Visit: Payer: Medicaid Other | Attending: Physical Medicine and Rehabilitation | Admitting: Physical Therapy

## 2015-05-17 DIAGNOSIS — M5441 Lumbago with sciatica, right side: Secondary | ICD-10-CM | POA: Insufficient documentation

## 2015-05-17 DIAGNOSIS — R2689 Other abnormalities of gait and mobility: Secondary | ICD-10-CM | POA: Diagnosis present

## 2015-05-17 DIAGNOSIS — M5442 Lumbago with sciatica, left side: Secondary | ICD-10-CM | POA: Diagnosis present

## 2015-05-17 DIAGNOSIS — M6281 Muscle weakness (generalized): Secondary | ICD-10-CM | POA: Diagnosis present

## 2015-05-17 DIAGNOSIS — R269 Unspecified abnormalities of gait and mobility: Secondary | ICD-10-CM

## 2015-05-17 NOTE — Patient Instructions (Signed)
   PAGE #1-2 of Core Stability Program.

## 2015-05-17 NOTE — Therapy (Signed)
[facility] [address] [place], [place], [address] Phone: [phone]   Fax:  [phone]  Physical Therapy Evaluation  Patient Details  Name: Alexandra Heath MRN: [id number] Date of Birth: 1980/12/15 Referring Provider: Sharlet Salina  Encounter Date: 05/17/2015      PT End of Session - 05/17/15 1255    Visit Number 1   Number of Visits 1   Authorization - Visit Number 1   Authorization - Number of Visits 1   PT Start Time [id number]   PT Stop Time 1205   PT Time Calculation (min) 47 min   Activity Tolerance Patient tolerated treatment well;Patient limited by pain   Behavior During Therapy [facility] for tasks assessed/performed      Past Medical History  Diagnosis Date  . Rapid heart beat   . Scoliosis   . Scoliosis   . Tachycardia     Past Surgical History  Procedure Laterality Date  . Gallbladder surgery      There were no vitals filed for this visit.  Visit Diagnosis:  Decreased spinal mobility  Muscular weakness  Abnormal gait  Bilateral low back pain with sciatica, sciatica laterality unspecified      Subjective Assessment - 05/17/15 1240    Subjective Pt reports having hx of backpain, and c/o current pain at a 5/10.  Pt reports that at its worst, her pain increases to a point where she is unable to move.  Pt has been prescribed pain medication but refuses to take it. Pt is currently not working, but reports trying to get back into working.  Pt stopped working when she was pregnant, but has not returned since.  Pt reports no comfortable position, and standing/sitting increases her pain.  Pt is unable to find a position or motion that relieves her pain, but has tried ice, heat, and exercises with minimal decreases in her pain.  Pt reports N/T that can travel down B LEs, but more frequently her L LE all the way to her toes.    Pt states that her N/T began a little over a year ago.  Pt has 3 kids, and enjoys spending  time with them but is unable to d/t her back pain.  Pt reports that she used to be an active person prior to her back pain.  Pt has a 5 year hx of scoliosis, and states that at time of dx her pain was there but manageable.  Pt was given a cane to use for ambulation but refuses to utilize it because of her kids.  Pt has hx of 1 fall a few month ago, reporting that she feels like her legs sometimes want to and do give out on her.  Pt states that she has applied for disability.    Limitations Sitting;Standing;Lifting;Walking;House hold activities   Patient Stated Goals Pt would like to have no back pain.    Currently in Pain? Yes   Pain Score 5    Pain Location Back   Pain Orientation Lower   Pain Type Chronic pain   Pain Onset More than a month ago   Pain Frequency Constant   Aggravating Factors  Standing, sitting, walking, sleeping            [facility] PT Assessment - 05/17/15 0001    Assessment   Medical Diagnosis low back pain   Referring Provider Sharlet Salina   Onset Date/Surgical Date 02/13/10   Prior Therapy Yes  OBJECTIVE: D/t pt's medicaid insurance, this was a 1x evaluation.  Issued HEP handouts.         PT Education - 05/17/15 1253    Education provided Yes   Education Details Educated on exercises within HEP, avoiding heat, and the process of repeated extension.    Person(s) Educated Patient   Methods Explanation;Demonstration;Tactile cues;Handout;Verbal cues   Comprehension Verbalized understanding;Returned demonstration;Verbal cues required;Tactile cues required            Plan - 05/17/15 1256    Clinical Impression Statement Pt is a 35 yo female who comes to therapy with chronic LBP for a 1x evaluation due to Medicaid limitations.  Pt presents with decreased spinal motion, and pain with all motion but worst pain occurs with B rotation (Flex: 50% limit, ext 75% limit, B rotation 50% limit).  Pt demonstrates positive active SLR bilaterally at 30 degrees.   Pt was not able to find comfort in any position, and had no pain relief with repeated standing extension.  Pt does have tenderness throughout lumbar musculature and across spinous processes, but no trigger points were noted.  Pt was tender to lumbar PA mobilizations, and c/o increaesd pain before SPT was able to apply any downward pressure.  Any PROM (lumbar PA mobilizations and hip flexion) had an empty end feel d/t pain.  Pt has decreased hip strength B (R: hip flex 4/5, knee flex 4/5, knee ext 4-/5 and pain, hip abd 3+/5, hip add 3+/5; L: hip flex 4-/5, knee ext 4-/5 and pain, knee flex 4/5, hip abd 3+/5, hip add 3+/5).  Pt demonstrates a mild scoliosis, but the curve is minimally visible.  Pt has a minimal core contraction, and presents with difficulty completing a contraction; required multiple v.c. and tactile cues.  Pt presents with a stiff gait.     Pt will benefit from skilled therapeutic intervention in order to improve on the following deficits Abnormal gait;Decreased activity tolerance;Decreased endurance;Decreased mobility;Decreased range of motion;Decreased strength;Hypomobility;Impaired flexibility;Improper body mechanics;Pain   Rehab Potential Fair   PT Frequency One time visit   PT Treatment/Interventions Neuromuscular re-education   PT Home Exercise Plan see handout   Consulted and Agree with Plan of Care Patient         Problem List Patient Active Problem List   Diagnosis Date Noted  . Numbness 04/06/2015  . Awareness of heartbeats 12/17/2014  . Scoliosis 12/17/2014  . Leg weakness 12/17/2014  . Depression with anxiety 12/17/2014  . Insomnia 12/17/2014  . Polyneuropathy ([place]) 09/10/2014  . Back muscle spasm 06/15/2014  . Idiopathic scoliosis 06/15/2014   Pura Spice, PT, DPT # [address] SPT   05/17/2015, 8:23 PM  [facility] [address] [place], [place], [address] Phone: [phone]   Fax:   [phone]  Name: Alexandra Heath MRN: [id number] Date of Birth: 06/16/1980

## 2015-06-10 ENCOUNTER — Ambulatory Visit: Payer: Medicaid Other

## 2015-06-10 ENCOUNTER — Ambulatory Visit
Admission: EM | Admit: 2015-06-10 | Discharge: 2015-06-10 | Disposition: A | Discharge status: Home / Self Care | Payer: Medicaid Other | Attending: Family Medicine | Admitting: Family Medicine

## 2015-06-10 ENCOUNTER — Encounter: Payer: Self-pay | Admitting: Emergency Medicine

## 2015-06-10 DIAGNOSIS — M25532 Pain in left wrist: Secondary | ICD-10-CM | POA: Insufficient documentation

## 2015-06-10 DIAGNOSIS — M654 Radial styloid tenosynovitis [de Quervain]: Secondary | ICD-10-CM

## 2015-06-10 MED ORDER — NAPROXEN 500 MG PO TABS: 500.0000 mg | ORAL_TABLET | Freq: Two times a day (BID) | ORAL | Status: DC

## 2015-06-10 NOTE — Discharge Instructions (Signed)
De Quervain Tenosynovitis °Tendons attach muscles to bones. They also help with joint movements. When tendons become irritated or swollen, it is called tendinitis. °The extensor pollicis brevis (EPB) tendon connects the EPB muscle to a bone that is near the base of the thumb. The EPB muscle helps to straighten and extend the thumb. De Quervain tenosynovitis is a condition in which the EPB tendon lining (sheath) becomes irritated, thickened, and swollen. This condition is sometimes called stenosing tenosynovitis. This condition causes pain on the thumb side of the back of the wrist. °CAUSES °Causes of this condition include: °· Activities that repeatedly cause your thumb and wrist to extend. °· A sudden increase in activity or change in activity that affects your wrist. °RISK FACTORS: °This condition is more likely to develop in: °· Females. °· People who have diabetes. °· Women who have recently given birth. °· People who are over 40 years of age. °· People who do activities that involve repeated hand and wrist motions, such as tennis, racquetball, volleyball, gardening, and taking care of children. °· People who do heavy labor. °· People who have poor wrist strength and flexibility. °· People who do not warm up properly before activities. °SYMPTOMS °Symptoms of this condition include: °· Pain or tenderness over the thumb side of the back of the wrist when your thumb and wrist are not moving. °· Pain that gets worse when you straighten your thumb or extend your thumb or wrist. °· Pain when the injured area is touched. °· Locking or catching of the thumb joint while you bend and straighten your thumb. °· Decreased thumb motion due to pain. °· Swelling over the affected area. °DIAGNOSIS °This condition is diagnosed with a medical history and physical exam. Your health care provider will ask for details about your injury and ask about your symptoms. °TREATMENT °Treatment may include the use of icing and medicines to  reduce pain and swelling. You may also be advised to wear a splint or brace to limit your thumb and wrist motion. In less severe cases, treatment may also include working with a physical therapist to strengthen your wrist and calm the irritation around your EPB tendon sheath. In severe cases, surgery may be needed. °HOME CARE INSTRUCTIONS °If You Have a Splint or Brace: °· Wear it as told by your health care provider. Remove it only as told by your health care provider. °· Loosen the splint or brace if your fingers become numb and tingle, or if they turn cold and blue. °· Keep the splint or brace clean and dry. °Managing Pain, Stiffness, and Swelling  °· If directed, apply ice to the injured area. °¨ Put ice in a plastic bag. °¨ Place a towel between your skin and the bag. °¨ Leave the ice on for 20 minutes, 2-3 times per day. °· Move your fingers often to avoid stiffness and to lessen swelling. °· Raise (elevate) the injured area above the level of your heart while you are sitting or lying down. °General Instructions °· Return to your normal activities as told by your health care provider. Ask your health care provider what activities are safe for you. °· Take over-the-counter and prescription medicines only as told by your health care provider. °· Keep all follow-up visits as told by your health care provider. This is important. °· Do not drive or operate heavy machinery while taking prescription pain medicine. °SEEK MEDICAL CARE IF: °· Your pain, tenderness, or swelling gets worse, even if you have had   treatment. °· You have numbness or tingling in your wrist, hand, or fingers on the injured side. °  °This information is not intended to replace advice given to you by your health care provider. Make sure you discuss any questions you have with your health care provider. °  °Document Released: 01/30/2005 Document Revised: 10/21/2014 Document Reviewed: 04/07/2014 °Elsevier Interactive Patient Education ©2016  [employer] ° °

## 2015-06-10 NOTE — ED Provider Notes (Signed)
CSN: [id number]     Arrival date & time 06/10/15  0911 History   First MD Initiated Contact with Patient 06/10/15 [id number]     Chief Complaint  Patient presents with  . Wrist Pain   (Consider location/radiation/quality/duration/timing/severity/associated sxs/prior Treatment) HPI  This a 35 year old female who presents with left nondominant wrist pain with radiation into her forearm and up into her arm. It is mostly radial based. He works at [employer] doing various jobs but has been busier lately with increased stock. She has a history of scoliosis and states that her neck and back hurt almost constantly and occasionally have radiation into her upper extremities. These pains usually resolved within a couple of days but this has not. This been bothering her for over a week and seems to be worsening. Although she has been prescribed anti-inflammatories in the past she prefers not to take them on a steady basis. He denies any numbness or tingling into her hand and complains mostly of pain.        Past Medical History  Diagnosis Date  . Rapid heart beat   . Scoliosis   . Scoliosis   . Tachycardia    Past Surgical History  Procedure Laterality Date  . Gallbladder surgery     History reviewed. No pertinent family history. Social History  Substance Use Topics  . Smoking status: Never Smoker   . Smokeless tobacco: Never Used  . Alcohol Use: No   OB History    No data available     Review of Systems  Constitutional: Positive for activity change. Negative for fever, chills and fatigue.  Musculoskeletal: Positive for myalgias. Negative for joint swelling, neck pain and neck stiffness.  All other systems reviewed and are negative.   Allergies  Review of patient's allergies indicates no known allergies.  Home Medications   Prior to Admission medications   Medication Sig Start Date End Date Taking? Authorizing Provider  amoxicillin (AMOXIL) 875 MG tablet Take 1 tablet (875 mg  total) by mouth 2 (two) times daily. Patient not taking: Reported on 12/17/2014 11/11/14   Norval Gable, MD  cyclobenzaprine (FLEXERIL) 5 MG tablet Take 1 tablet (5 mg total) by mouth every 8 (eight) hours as needed for muscle spasms. 04/06/15   Britt Bottom, MD  DULoxetine (CYMBALTA) 60 MG capsule Take 1 capsule (60 mg total) by mouth daily. 04/06/15   Britt Bottom, MD  gabapentin (NEURONTIN) 300 MG capsule Take 1 capsule (300 mg total) by mouth 3 (three) times daily. Patient not taking: Reported on 04/06/2015 12/17/14 12/17/15  Britt Bottom, MD  naproxen (NAPROSYN) 500 MG tablet Take 1 tablet (500 mg total) by mouth 2 (two) times daily with a meal. 06/10/15   Lorin Picket, PA-C  propranolol (INDERAL) 20 MG/5ML solution Take by mouth 3 (three) times daily.    Historical Provider, MD   Meds Ordered and Administered this Visit  Medications - No data to display  BP 110/62 mmHg  Pulse 64  Temp(Src) 97 F (36.1 C) (Tympanic)  Resp 16  Ht 5\' 6"  (1.676 m)  Wt 135 lb (61.236 kg)  BMI 21.80 kg/m2  SpO2 100%  LMP  No data found.   Physical Exam  Constitutional: She appears well-developed and well-nourished. No distress.  HENT:  Head: Normocephalic and atraumatic.  Eyes: Conjunctivae are normal. Pupils are equal, round, and reactive to light.  Neck: Normal range of motion. Neck supple.  Musculoskeletal:  Examination of the left wrist  shows full supination pronation and some restriction of extension and flexion due to radial wrist pain. Most tenderness is over the radial styloid and proximal up the radius. She has a positive Finkelstein test. There is no swelling present. She is also tender over the small row of carpals. Sensation is intact distally.  Skin: She is not diaphoretic.  Nursing note and vitals reviewed.   ED Course  Procedures (including critical care time)  Labs Review Labs Reviewed - No data to display  Imaging Review Dg Wrist Complete Left  06/10/2015  CLINICAL  DATA:  Pain for 2 weeks, no known injury, initial encounter EXAM: LEFT WRIST - COMPLETE 3+ VIEW COMPARISON:  None. FINDINGS: There is no evidence of fracture or dislocation. There is no evidence of arthropathy or other focal bone abnormality. Soft tissues are unremarkable. IMPRESSION: No acute abnormality seen. Electronically Signed   By: Inez Catalina M.D.   On: 06/10/2015 10:46     Visual Acuity Review  Right Eye Distance:   Left Eye Distance:   Bilateral Distance:    Right Eye Near:   Left Eye Near:    Bilateral Near:         MDM   1. De Quervain's tenosynovitis, left    New Prescriptions   NAPROXEN (NAPROSYN) 500 MG TABLET    Take 1 tablet (500 mg total) by mouth 2 (two) times daily with a meal.  Plan: 1. Test/x-ray results and diagnosis reviewed with patient 2. rx as per orders; risks, benefits, potential side effects reviewed with patient 3. Recommend supportive treatment with Radial gutter splint for activities and at bedtime for 2-4 weeks. I recommended use of anti-inflammatories for about 2 weeks. If she is not improving she should follow-up with a hand surgeon for possible injections and ultimately may require surgical release. She is now working today and I will give her out of work tomorrow but she may return to work on Saturday. 4. F/u prn if symptoms worsen or don't improve      Lorin Picket, PA-C 06/10/15 1109

## 2015-06-10 NOTE — ED Notes (Signed)
Patient c/o pain in her left wrist off and on for over a week.  Patient denies injury.

## 2015-08-04 ENCOUNTER — Ambulatory Visit: Payer: BLUE CROSS/BLUE SHIELD | Admitting: Neurology

## 2015-08-05 ENCOUNTER — Encounter: Payer: Self-pay | Admitting: Neurology

## 2016-03-23 ENCOUNTER — Ambulatory Visit
Admission: EM | Admit: 2016-03-23 | Discharge: 2016-03-23 | Disposition: A | Discharge status: Home / Self Care | Payer: Medicaid Other | Attending: Family Medicine | Admitting: Family Medicine

## 2016-03-23 ENCOUNTER — Encounter: Payer: Self-pay | Admitting: *Deleted

## 2016-03-23 ENCOUNTER — Ambulatory Visit: Payer: Medicaid Other

## 2016-03-23 DIAGNOSIS — M25532 Pain in left wrist: Secondary | ICD-10-CM | POA: Diagnosis not present

## 2016-03-23 DIAGNOSIS — G5602 Carpal tunnel syndrome, left upper limb: Secondary | ICD-10-CM

## 2016-03-23 MED ORDER — NAPROXEN 500 MG PO TABS: 500.0000 mg | ORAL_TABLET | Freq: Two times a day (BID) | ORAL | 0 refills | Status: DC

## 2016-03-23 MED ORDER — NAPROXEN 500 MG PO TABS
500.0000 mg | ORAL_TABLET | Freq: Two times a day (BID) | ORAL | 0 refills | Status: DC
Start: 2016-03-23 — End: 2016-03-23

## 2016-03-23 NOTE — ED Triage Notes (Signed)
Gradual onset left wrist pain and edema over past few days. Pt denies injury.

## 2016-03-23 NOTE — ED Provider Notes (Signed)
CSN: [id number]     Arrival date & time 03/23/16  1503 History   None    Chief Complaint  Patient presents with  . Wrist Pain   (Consider location/radiation/quality/duration/timing/severity/associated sxs/prior Treatment) HPI  36 year old female who is familiar to the practice. Previously had a [employer] on the left that was treated conservatively and improved. She returns with two-week history of left wrist and swelling that she poorly localizes and defines. Denies any specific injury. 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 Her pain seems to be on the volar wrist and also along her volar forearm. He states that her son fell asleep in her arms one time and her her arm fell asleep. She will at times awaken at night with the numbness and pain in her hands that she shakes to alleviate.      Past Medical History:  Diagnosis Date  . Rapid heart beat   . Scoliosis   . Scoliosis   . Tachycardia    Past Surgical History:  Procedure Laterality Date  . GALLBLADDER SURGERY     History reviewed. No pertinent family history. Social History  Substance Use Topics  . Smoking status: Never Smoker  . Smokeless tobacco: Never Used  . Alcohol use No   OB History    No data available     Review of Systems  Constitutional: Positive for activity change.  Musculoskeletal: Positive for myalgias.  Neurological: Positive for numbness.  All other systems reviewed and are negative.   Allergies  Patient has no known allergies.  Home Medications   Prior to Admission medications   Medication Sig Start Date End Date Taking? Authorizing Provider  propranolol (INDERAL) 20 MG/5ML solution Take by mouth 3 (three) times daily.   Yes Historical Provider, MD  gabapentin (NEURONTIN) 300 MG capsule Take 1 capsule (300 mg total) by mouth 3 (three) times daily. Patient not taking: Reported on 04/06/2015 12/17/14 12/17/15  Britt Bottom, MD   naproxen (NAPROSYN) 500 MG tablet Take 1 tablet (500 mg total) by mouth 2 (two) times daily with a meal. 03/23/16   Lorin Picket, PA-C   Meds Ordered and Administered this Visit  Medications - No data to display  BP 110/81 (BP Location: Right Arm)   Pulse 70   Temp 98.7 F (37.1 C) (Oral)   Resp 16   Ht 5\' 6"  (1.676 m)   Wt 134 lb (60.8 kg)   SpO2 100%   BMI 21.63 kg/m  No data found.   Physical Exam  Constitutional: She appears well-developed and well-nourished. No distress.  HENT:  Head: Normocephalic and atraumatic.  Eyes: EOM are normal. Pupils are equal, round, and reactive to light.  Neck: Normal range of motion. Neck supple.  Musculoskeletal:  Examination of the left nondominant hand and wrist shows extension of 75 on the left and 85 degrees on the right. Normal flexion and pronation and supination. No thenar wasting noted.. Is a negative Allen's test. Sweating is equal bilaterally. Is a positive Tinel's at the wrist angling and median distribution. Positive Phalen's also with a median nerve distribution. Sensation is decreased in the median nerve distributed medication however there are some variables present. Range of motion is full. There is no tenderness over the condyles.  Skin: She is not diaphoretic.  Nursing note and vitals reviewed.   Urgent Care Course     Procedures (including critical care time)  Labs Review Labs  Reviewed - No data to display  Imaging Review Dg Wrist Complete Left  Result Date: 03/23/2016 CLINICAL DATA:  36 year old female with increasing left wrist pain and swelling for several days with no known injury. Pole are pain. Initial encounter. EXAM: LEFT WRIST - COMPLETE 3+ VIEW COMPARISON:  06/10/2015 left wrist series. FINDINGS: Bone mineralization is within normal limits. Visible left radius and ulna appear stable and normal. Carpal bone alignment is normal. Scaphoid and tact. Carpal bone joint spaces are preserved. Proximal metacarpals  appear normal. Normal carpal canal view. IMPRESSION: Stable and normal radiographic appearance of the left wrist. Electronically Signed   By: Genevie Ann M.D.   On: 03/23/2016 17:30     Visual Acuity Review  Right Eye Distance:   Left Eye Distance:   Bilateral Distance:    Right Eye Near:   Left Eye Near:    Bilateral Near:     Patient was given a Velcro wrist splint for the left wrist    MDM   1. Carpal tunnel syndrome on left    Discharge Medication List as of 03/23/2016  6:11 PM    Plan: 1. Test/x-ray results and diagnosis reviewed with patient 2. rx as per orders; risks, benefits, potential side effects reviewed with patient 3. Recommend supportive treatment with Wrist splint full-time for 2 weeks removing only for personal care. She will wear the wrist splint every night for the next month. Place her on Naprosyn for anti-inflammatory effect. After months she is not improved she will follow-up with an orthopedic surgeon through her primary care physician. 4. F/u prn if symptoms worsen or don't improve     Lorin Picket, PA-C 03/23/16 1813

## 2016-10-06 ENCOUNTER — Other Ambulatory Visit: Payer: Self-pay | Admitting: Neurology

## 2016-10-06 DIAGNOSIS — R42 Dizziness and giddiness: Secondary | ICD-10-CM

## 2016-10-17 ENCOUNTER — Ambulatory Visit: Payer: Medicaid Other

## 2016-10-18 ENCOUNTER — Ambulatory Visit
Admission: RE | Admit: 2016-10-18 | Discharge: 2016-10-18 | Disposition: A | Discharge status: Home / Self Care | Payer: Medicaid Other | Source: Ambulatory Visit | Attending: Neurology | Admitting: Neurology

## 2016-10-18 DIAGNOSIS — G43109 Migraine with aura, not intractable, without status migrainosus: Secondary | ICD-10-CM | POA: Insufficient documentation

## 2016-10-18 DIAGNOSIS — G629 Polyneuropathy, unspecified: Secondary | ICD-10-CM | POA: Diagnosis present

## 2016-10-18 DIAGNOSIS — R42 Dizziness and giddiness: Secondary | ICD-10-CM

## 2016-10-18 MED ORDER — GADOBENATE DIMEGLUMINE 529 MG/ML IV SOLN
12.0000 mL | Freq: Once | INTRAVENOUS | Status: AC | PRN
  Administered 2016-10-18: 12 mL via INTRAVENOUS

## 2016-12-07 ENCOUNTER — Ambulatory Visit
Admission: EM | Admit: 2016-12-07 | Discharge: 2016-12-07 | Disposition: A | Discharge status: Home / Self Care | Payer: Medicaid Other | Attending: Family Medicine | Admitting: Family Medicine

## 2016-12-07 ENCOUNTER — Encounter: Payer: Self-pay | Admitting: *Deleted

## 2016-12-07 DIAGNOSIS — G8929 Other chronic pain: Secondary | ICD-10-CM

## 2016-12-07 DIAGNOSIS — M546 Pain in thoracic spine: Secondary | ICD-10-CM | POA: Diagnosis not present

## 2016-12-07 NOTE — ED Triage Notes (Signed)
Patient started having right neck and back pain 2 days ago. Patient has a history of scoliosis.

## 2016-12-07 NOTE — ED Provider Notes (Addendum)
[facility]    CSN: [id number] Arrival date & time: 12/07/16  1121  History   Chief Complaint Chief Complaint  Patient presents with  . Back Pain  . Neck Pain   HPI  36 year old female with chronic back pain/musculoskeletal pain presents with back pain.  Patient reports that she has been dealing with this for years.  She has tried numerous interventions: Physical therapy, multiple NSAIDs, narcotics, multiple muscle relaxants, gabapentin, TCA.  She has had no improvement.  She states that her pain has been worse for the past 2 days.  Starts at the upper thoracic region and goes down the right side of her thoracic spine.  Worse with range of motion.  No relieving factors.  No reports of radiculopathy at this time.  No other complaints or concerns.  Past Medical History:  Diagnosis Date  . Rapid heart beat   . Scoliosis   . Scoliosis   . Tachycardia    Patient Active Problem List   Diagnosis Date Noted  . Numbness 04/06/2015  . Awareness of heartbeats 12/17/2014  . Scoliosis 12/17/2014  . Leg weakness 12/17/2014  . Depression with anxiety 12/17/2014  . Insomnia 12/17/2014  . Polyneuropathy 09/10/2014  . Back muscle spasm 06/15/2014  . Idiopathic scoliosis 06/15/2014   Past Surgical History:  Procedure Laterality Date  . GALLBLADDER SURGERY     OB History    No data available     Home Medications    Prior to Admission medications   Medication Sig Start Date End Date Taking? Authorizing Provider  nortriptyline (PAMELOR) 10 MG capsule Take 10 mg by mouth at bedtime.   Yes [provider]  propranolol (INDERAL) 20 MG/5ML solution Take by mouth 3 (three) times daily.   Yes [provider]  SUMAtriptan (IMITREX) 50 MG tablet Take 50 mg by mouth every 2 (two) hours as needed for migraine. May repeat in 2 hours if headache persists or recurs.   Yes [provider]  gabapentin (NEURONTIN) 300 MG capsule Take 1 capsule (300 mg total) by  mouth 3 (three) times daily. Patient not taking: Reported on 04/06/2015 12/17/14 12/17/15  Britt Bottom, MD  naproxen (NAPROSYN) 500 MG tablet Take 1 tablet (500 mg total) by mouth 2 (two) times daily with a meal. 03/23/16   Lorin Picket, PA-C   Family History High blood pressure (Hypertension) Father    HIV Mother    Migraines Mother    Crohn's disease Sister     Social History Social History  Substance Use Topics  . Smoking status: Never Smoker  . Smokeless tobacco: Never Used  . Alcohol use No   Allergies   Patient has no known allergies.  Review of Systems Review of Systems  Constitutional: Negative.   Musculoskeletal: Positive for back pain and neck pain.   Physical Exam Triage Vital Signs ED Triage Vitals  Enc Vitals Group     BP 12/07/16 1127 130/73     Pulse Rate 12/07/16 1127 73     Resp 12/07/16 1127 16     Temp 12/07/16 1127 98.3 F (36.8 C)     Temp Source 12/07/16 1127 Oral     SpO2 12/07/16 1127 100 %     Weight 12/07/16 1126 130 lb 9.6 oz (59.2 kg)     Height 12/07/16 1126 5\' 6"  (1.676 m)     Head Circumference --      Peak Flow --      Pain Score 12/07/16  1129 8     Pain Loc --      Pain Edu? --      Excl. in [place]? --    Updated Vital Signs BP 130/73 (BP Location: Left Arm)   Pulse 73   Temp 98.3 F (36.8 C) (Oral)   Resp 16   Ht 5\' 6"  (1.676 m)   Wt 130 lb 9.6 oz (59.2 kg)   SpO2 100%   BMI 21.08 kg/m   Physical Exam  Constitutional: She is oriented to person, place, and time. She appears well-developed. No distress.  Cardiovascular: Normal rate and regular rhythm.   No murmur heard. Pulmonary/Chest: Effort normal. No respiratory distress. She has no wheezes. She has no rales.  Musculoskeletal:  Thoracic spine -severe right paraspinal tenderness to minimal palpation.  Out of proportion to force applied.  Decreased range of motion.  Neurological: She is alert and oriented to person, place, and time.  Psychiatric:  Flat affect,  depressed mood.  Vitals reviewed.  UC Treatments / Results  Labs (all labs ordered are listed, but only abnormal results are displayed) Labs Reviewed - No data to display  EKG  EKG Interpretation None       Radiology No results found.  Procedures Procedures (including critical care time)  Medications Ordered in UC Medications - No data to display   Initial Impression / Assessment and Plan / UC Course  I have reviewed the triage vital signs and the nursing notes.  Pertinent labs & imaging results that were available during my care of the patient were reviewed by me and considered in my medical decision making (see chart for details).    36 year old female presents with chronic pain.  She has tried all interventions that I am aware of other than surgery.  I offered different steroids, anti-inflammatory injection, and other oral medications.  Patient states that she has had no improvement and does not like medication.  I informed her that I do not have any other treatment recommendations or things that I can offer her at this time.  Advised her to follow-up closely with her physicians.  Final Clinical Impressions(s) / UC Diagnoses   Final diagnoses:  Chronic right-sided thoracic back pain   New Prescriptions New Prescriptions   No medications on file   Controlled Substance Prescriptions [place] Controlled Substance Registry consulted? Not Applicable   15 minutes were spent face-to-face with the patient during this encounter and over half of that time was spent discussing treatment options.    [name], DO 12/07/16 1210

## 2016-12-07 NOTE — Discharge Instructions (Signed)
So sorry for your pain.  Best of luck  Take care  Dr. Lacinda Axon

## 2017-06-12 DIAGNOSIS — G43909 Migraine, unspecified, not intractable, without status migrainosus: Secondary | ICD-10-CM | POA: Insufficient documentation

## 2017-06-12 LAB — HM PAP SMEAR: HM Pap smear: NEGATIVE

## 2017-06-12 LAB — HM HIV SCREENING LAB: HM HIV SCREENING: NEGATIVE

## 2017-08-28 DIAGNOSIS — Z30013 Encounter for initial prescription of injectable contraceptive: Secondary | ICD-10-CM | POA: Diagnosis not present

## 2017-08-28 DIAGNOSIS — Z309 Encounter for contraceptive management, unspecified: Secondary | ICD-10-CM | POA: Diagnosis not present

## 2017-10-07 ENCOUNTER — Other Ambulatory Visit: Payer: Self-pay

## 2017-10-07 ENCOUNTER — Encounter: Payer: Self-pay | Admitting: Emergency Medicine

## 2017-10-07 ENCOUNTER — Ambulatory Visit
Admission: EM | Admit: 2017-10-07 | Discharge: 2017-10-07 | Disposition: A | Discharge status: Home / Self Care | Payer: Medicaid Other | Attending: Family Medicine | Admitting: Family Medicine

## 2017-10-07 DIAGNOSIS — R21 Rash and other nonspecific skin eruption: Secondary | ICD-10-CM

## 2017-10-07 MED ORDER — PREDNISONE 10 MG (21) PO TBPK: ORAL_TABLET | ORAL | 0 refills | Status: DC

## 2017-10-07 MED ORDER — TRIAMCINOLONE ACETONIDE 0.1 % EX OINT: 1.0000 "application " | TOPICAL_OINTMENT | Freq: Two times a day (BID) | CUTANEOUS | 0 refills | Status: DC

## 2017-10-07 NOTE — Discharge Instructions (Signed)
Meds as prescribed. ° °Take care ° °Dr. Matix Henshaw  °

## 2017-10-07 NOTE — ED Provider Notes (Signed)
[facility]    CSN: [id number] Arrival date & time: 10/07/17  0957  History   Chief Complaint Chief Complaint  Patient presents with  . Rash   HPI  37 year old female presents with rash.  Patient reports a 1.5-week history of rash.  Patient reports that 2 of her children also have the same rash.  Patient's rash is currently located on her arm, back, stomach, and left leg.  No hand or wrist involvement.  No genital involvement.  It is incredibly itchy.  Severe.  No medications tried.  He has been using alcohol which somewhat helps.  Has not resolved.  No new exposures or changes.  The 2 children that are affected also sleep with her at least some of the time.  No other associated symptoms.  No other complaints.  PMH, Surgical Hx, Social History reviewed and updated as below.  Past Medical History:  Diagnosis Date  . Rapid heart beat   . Scoliosis   . Scoliosis   . Tachycardia     Patient Active Problem List   Diagnosis Date Noted  . Numbness 04/06/2015  . Awareness of heartbeats 12/17/2014  . Scoliosis 12/17/2014  . Leg weakness 12/17/2014  . Depression with anxiety 12/17/2014  . Insomnia 12/17/2014  . Polyneuropathy 09/10/2014  . Back muscle spasm 06/15/2014  . Idiopathic scoliosis 06/15/2014    Past Surgical History:  Procedure Laterality Date  . GALLBLADDER SURGERY      OB History   None    Social History Social History   Tobacco Use  . Smoking status: Never Smoker  . Smokeless tobacco: Never Used  Substance Use Topics  . Alcohol use: No  . Drug use: No     Allergies   Patient has no known allergies.   Review of Systems Review of Systems  Constitutional: Negative.   Skin: Positive for rash.   Physical Exam Triage Vital Signs ED Triage Vitals  Enc Vitals Group     BP 10/07/17 1012 124/76     Pulse Rate 10/07/17 1012 68     Resp 10/07/17 1012 16     Temp 10/07/17 1012 98.3 F (36.8 C)     Temp Source 10/07/17 1012 Oral   SpO2 10/07/17 1012 100 %     Weight 10/07/17 1011 134 lb 12.8 oz (61.1 kg)     Height 10/07/17 1011 5\' 6"  (1.676 m)     Head Circumference --      Peak Flow --      Pain Score 10/07/17 1011 0     Pain Loc --      Pain Edu? --      Excl. in [place]? --    Updated Vital Signs BP 124/76 (BP Location: Left Arm)   Pulse 68   Temp 98.3 F (36.8 C) (Oral)   Resp 16   Ht 5\' 6"  (1.676 m)   Wt 61.1 kg   SpO2 100%   BMI 21.76 kg/m   Visual Acuity Right Eye Distance:   Left Eye Distance:   Bilateral Distance:    Right Eye Near:   Left Eye Near:    Bilateral Near:     Physical Exam  Constitutional: She is oriented to person, place, and time. She appears well-developed. No distress.  HENT:  Head: Normocephalic and atraumatic.  Pulmonary/Chest: Effort normal. No respiratory distress.  Neurological: She is alert and oriented to person, place, and time.  Skin:  Right arm, back, abdomen with scattered erythematous papules  which are firm to the touch.  Evidence of excoriation noted.  Psychiatric: She has a normal mood and affect. Her behavior is normal.  Nursing note and vitals reviewed.  UC Treatments / Results  Labs (all labs ordered are listed, but only abnormal results are displayed) Labs Reviewed - No data to display  EKG None  Radiology No results found.  Procedures Procedures (including critical care time)  Medications Ordered in UC Medications - No data to display  Initial Impression / Assessment and Plan / UC Course  I have reviewed the triage vital signs and the nursing notes.  Pertinent labs & imaging results that were available during my care of the patient were reviewed by me and considered in my medical decision making (see chart for details).    37 year old female presents for rash.  Does not appear to be scabies.  Other household individuals are involved.  I suspect that this is bedbugs.  Placing on triamcinolone.  Mother's symptoms are severe, so I am placing  her on a short prednisone taper as well.  Information given about bedbugs.  Final Clinical Impressions(s) / UC Diagnoses   Final diagnoses:  Rash     Discharge Instructions     Meds as prescribed.  Take care  Dr. Lacinda Axon    ED Prescriptions    Medication Sig Dispense Auth. Provider   triamcinolone ointment (KENALOG) 0.1 % Apply 1 application topically 2 (two) times daily. 30 g Elayna Tobler G, DO   predniSONE (STERAPRED UNI-PAK 21 TAB) 10 MG (21) TBPK tablet 6 tablets on day 1; decrease by 1 tablet daily until gone. 21 tablet [name], DO     Controlled Substance Prescriptions [place] Controlled Substance Registry consulted? Not Applicable   [name], DO 10/07/17 1054

## 2017-10-07 NOTE — ED Triage Notes (Signed)
Patient c/o itchy red bumps on her arms and body for the past week.

## 2017-10-11 DIAGNOSIS — G629 Polyneuropathy, unspecified: Secondary | ICD-10-CM | POA: Diagnosis not present

## 2017-10-11 DIAGNOSIS — R002 Palpitations: Secondary | ICD-10-CM | POA: Diagnosis not present

## 2017-10-11 DIAGNOSIS — M62830 Muscle spasm of back: Secondary | ICD-10-CM | POA: Diagnosis not present

## 2017-10-11 DIAGNOSIS — M419 Scoliosis, unspecified: Secondary | ICD-10-CM | POA: Diagnosis not present

## 2017-11-15 DIAGNOSIS — Z30013 Encounter for initial prescription of injectable contraceptive: Secondary | ICD-10-CM | POA: Diagnosis not present

## 2017-11-15 DIAGNOSIS — Z309 Encounter for contraceptive management, unspecified: Secondary | ICD-10-CM | POA: Diagnosis not present

## 2017-12-13 ENCOUNTER — Ambulatory Visit
Admission: EM | Admit: 2017-12-13 | Discharge: 2017-12-13 | Disposition: A | Discharge status: Home / Self Care | Payer: Medicaid Other | Attending: Family Medicine | Admitting: Family Medicine

## 2017-12-13 ENCOUNTER — Other Ambulatory Visit: Payer: Self-pay

## 2017-12-13 ENCOUNTER — Encounter: Payer: Self-pay | Admitting: Emergency Medicine

## 2017-12-13 DIAGNOSIS — S39011A Strain of muscle, fascia and tendon of abdomen, initial encounter: Secondary | ICD-10-CM

## 2017-12-13 DIAGNOSIS — X58XXXA Exposure to other specified factors, initial encounter: Secondary | ICD-10-CM

## 2017-12-13 NOTE — ED Triage Notes (Signed)
Patient in today c/o RUQ abdominal pain x 2 days. Patient states that she sat up in bed and turned really fast on Tuesday and has had the pain since then. Patient states she has the pain when she moves.

## 2017-12-13 NOTE — ED Provider Notes (Addendum)
[facility]    CSN: [id number] Arrival date & time: 12/13/17  1206     History   Chief Complaint Chief Complaint  Patient presents with  . Abdominal Pain    RUQ    HPI Alexandra Heath is a 37 y.o. female.   HPI  37 year old female presents with right upper quadrant abdominal pain that she has had for 2 days.  She relates that she suddenly sat up in bed and turned to the left to help her son who was having an asthmatic attack.  This happened 2 days ago.  Since that time it has hurt  with certain motions which she describes as a twisting of her torso. If   she sits  still it does not hurt.  Is very localized over her lower rib.  She has already had a cholecystectomy.  She states it does not bother her with deep inspiration.  Is only with the certain twisting motion.  Does not radiate.         Past Medical History:  Diagnosis Date  . Rapid heart beat   . Scoliosis   . Scoliosis   . Tachycardia     Patient Active Problem List   Diagnosis Date Noted  . Numbness 04/06/2015  . Awareness of heartbeats 12/17/2014  . Scoliosis 12/17/2014  . Leg weakness 12/17/2014  . Depression with anxiety 12/17/2014  . Insomnia 12/17/2014  . Polyneuropathy 09/10/2014  . Back muscle spasm 06/15/2014  . Idiopathic scoliosis 06/15/2014    Past Surgical History:  Procedure Laterality Date  . GALLBLADDER SURGERY      OB History   None      Home Medications    Prior to Admission medications   Medication Sig Start Date End Date Taking? Authorizing Provider  omeprazole (PRILOSEC) 20 MG capsule Take 20 mg by mouth daily as needed.   Yes [provider]  propranolol (INDERAL) 20 MG/5ML solution Take by mouth 3 (three) times daily.   Yes [provider]  SUMAtriptan (IMITREX) 50 MG tablet Take 50 mg by mouth every 2 (two) hours as needed for migraine. May repeat in 2 hours if headache persists or recurs.   Yes [provider]    Family  History Family History  Problem Relation Age of Onset  . HIV Mother   . Hypertension Father   . Diabetes Father     Social History Social History   Tobacco Use  . Smoking status: Never Smoker  . Smokeless tobacco: Never Used  Substance Use Topics  . Alcohol use: No  . Drug use: No     Allergies   Patient has no known allergies.   Review of Systems Review of Systems  Constitutional: Positive for activity change. Negative for appetite change, chills, fatigue and fever.  Musculoskeletal: Positive for myalgias.  All other systems reviewed and are negative.    Physical Exam Triage Vital Signs ED Triage Vitals  Enc Vitals Group     BP 12/13/17 1229 123/68     Pulse Rate 12/13/17 1229 73     Resp 12/13/17 1229 16     Temp 12/13/17 1229 98.9 F (37.2 C)     Temp Source 12/13/17 1229 Oral     SpO2 12/13/17 1229 100 %     Weight 12/13/17 1229 136 lb (61.7 kg)     Height 12/13/17 1229 5\' 6"  (1.676 m)     Head Circumference --      Peak Flow --  Pain Score 12/13/17 1228 3     Pain Loc --      Pain Edu? --      Excl. in [place]? --    No data found.  Updated Vital Signs BP 123/68 (BP Location: Left Arm)   Pulse 73   Temp 98.9 F (37.2 C) (Oral)   Resp 16   Ht 5\' 6"  (1.676 m)   Wt 136 lb (61.7 kg)   SpO2 100%   BMI 21.95 kg/m   Visual Acuity Right Eye Distance:   Left Eye Distance:   Bilateral Distance:    Right Eye Near:   Left Eye Near:    Bilateral Near:     Physical Exam  Constitutional: She is oriented to person, place, and time. She appears well-developed and well-nourished.  Non-toxic appearance. She does not appear ill. No distress.  HENT:  Head: Normocephalic.  Eyes: Pupils are equal, round, and reactive to light.  Pulmonary/Chest: Effort normal and breath sounds normal. She exhibits tenderness.  Abdominal: Normal appearance and bowel sounds are normal. There is tenderness in the right upper quadrant.  Exam reveals point tenderness over the  inferior most rib at its midportion in the mid clavicular line which is tender and reproduces her symptoms.  The intercostal space is not tender nor is the rib itself.  Having her sitting up from a recumbent position because the pain as does left thoracic rotation.  There is no crepitus over the rib.  Breath sounds are full and equal and not painful.  Does not have any sternal pain  Neurological: She is alert and oriented to person, place, and time.  Skin: Skin is warm and dry.  Psychiatric: She has a normal mood and affect. Her behavior is normal.  Nursing note and vitals reviewed.    UC Treatments / Results  Labs (all labs ordered are listed, but only abnormal results are displayed) Labs Reviewed - No data to display  EKG None  Radiology No results found.  Procedures Procedures (including critical care time)  Medications Ordered in UC Medications - No data to display  Initial Impression / Assessment and Plan / UC Course  I have reviewed the triage vital signs and the nursing notes.  Pertinent labs & imaging results that were available during my care of the patient were reviewed by me and considered in my medical decision making (see chart for details).     Advised the patient to avoid symptoms.. Use ice for comfort.  Patient does not like taking medications but I told her that ibuprofen 400 mg with food 3 times daily may be beneficial.  Does exercises for scoliosis told her that she may continue those as long as it does not cause her to have pain.  She should limit her activities sufficient to avoid symptoms the next 2 to 4 weeks and then gradually return to her activities as tolerated if she is not improving she should follow-up with her primary care physician Final Clinical Impressions(s) / UC Diagnoses   Final diagnoses:  Strain of rectus abdominis muscle, initial encounter     Discharge Instructions     Apply ice 20 minutes out of every 2 hours 4-5 times daily for  comfort.  As we discussed ,try taking ibuprofen 400 mg 3 times daily with food.  You may increase to 600 if not effective.   It is important that you do not cause pain avoiding symptoms as much as possible.   ED Prescriptions  None     Controlled Substance Prescriptions [place] Controlled Substance Registry consulted? Not Applicable   Lorin Picket, PA-C 12/13/17 1336    Lorin Picket, PA-C 12/13/17 1339

## 2017-12-13 NOTE — Discharge Instructions (Addendum)
Apply ice 20 minutes out of every 2 hours 4-5 times daily for comfort.  As we discussed ,try taking ibuprofen 400 mg 3 times daily with food.  You may increase to 600 if not effective.   It is important that you do not cause pain avoiding symptoms as much as possible.

## 2018-01-31 DIAGNOSIS — Z309 Encounter for contraceptive management, unspecified: Secondary | ICD-10-CM | POA: Diagnosis not present

## 2018-01-31 DIAGNOSIS — Z30013 Encounter for initial prescription of injectable contraceptive: Secondary | ICD-10-CM | POA: Diagnosis not present

## 2018-07-04 DIAGNOSIS — Z309 Encounter for contraceptive management, unspecified: Secondary | ICD-10-CM | POA: Diagnosis not present

## 2018-07-04 DIAGNOSIS — Z30013 Encounter for initial prescription of injectable contraceptive: Secondary | ICD-10-CM | POA: Diagnosis not present

## 2018-09-18 DIAGNOSIS — G40319 Generalized idiopathic epilepsy and epileptic syndromes, intractable, without status epilepticus: Secondary | ICD-10-CM | POA: Insufficient documentation

## 2018-11-04 ENCOUNTER — Other Ambulatory Visit
Admission: RE | Admit: 2018-11-04 | Discharge: 2018-11-04 | Disposition: A | Discharge status: Home / Self Care | Payer: Medicaid Other | Source: Ambulatory Visit | Attending: Nurse Practitioner | Admitting: Nurse Practitioner

## 2018-11-04 DIAGNOSIS — R197 Diarrhea, unspecified: Secondary | ICD-10-CM | POA: Insufficient documentation

## 2018-11-12 LAB — CALPROTECTIN, FECAL: Calprotectin, Fecal: 24 ug/g (ref 0–120)

## 2018-11-12 LAB — PANCREATIC ELASTASE, FECAL: Pancreatic Elastase-1, Stool: >500 ug Elast./g (ref >200)

## 2019-02-14 NOTE — L&D Delivery Note (Signed)
Delivery Note  Alexandra Heath is a [id number] at [redacted]w[redacted]d with an LMP of 04/11/2019, inconsistent with [place] [redacted]w[redacted]d.   First Stage: Labor onset: 2245 Augmentation: none Analgesia /Anesthesia intrapartum: epidural SROM at 0355 with moderate meconium.    Second Stage: Complete dilation at 0355 Onset of pushing at 0410 FHR second stage 135 bpm with moderate variability and intermittent variables   Alexandra Heath presented to L&D with progressively more painful contractions.  She was expectantly managed and progressed to C/C/+2 with SROM for moderate meconium and spontaneous urge to push.  She pushed effectively over 1 contractions for a spontaneous birth..   Delivery of a viable baby boy on 12/26/19 at 0411 by CNM Delivery of fetal head in OA position with restitution to LOT. Loose nuchal cord x 1, reduced;  Anterior then posterior shoulders delivered easily with gentle downward traction. Baby placed on mom's chest, and attended to by baby RN. Cord double clamped after cessation of pulsation, cut by father of baby.   Cord blood sample collected: N/A - A pos  Third Stage: Oxytocin bolus started after delivery of infant for hemorrhage prophylaxis  Placenta delivered intact with 3 VC @ 0417 Placenta disposition: discarded  Uterine tone firm / bleeding moderate  No laceration identified  Anesthesia for repair: N/A Repair: N/A  Est. Blood Loss (mL): 300PQ  Complications: None  Mom to postpartum.  Baby to Couplet care / Skin to Skin.  Newborn: Information for the patient's newborn:  Alexandra Heath, Alexandra Heath [[id number]]  Live born female "Lennox Grumbles" Birth Weight: pending   APGAR: 63, 9  Newborn Delivery   Birth date/time: 12/26/2019 04:11:00 Delivery type: Vaginal, Spontaneous     Feeding planned: breast and formula   ---------- Drinda Butts, CNM Certified Nurse Midwife [facility]

## 2019-04-01 ENCOUNTER — Other Ambulatory Visit: Payer: Self-pay

## 2019-04-01 ENCOUNTER — Encounter: Payer: Self-pay | Admitting: Emergency Medicine

## 2019-04-01 ENCOUNTER — Ambulatory Visit
Admission: EM | Admit: 2019-04-01 | Discharge: 2019-04-01 | Disposition: A | Discharge status: Home / Self Care | Payer: Self-pay | Attending: Family Medicine | Admitting: Family Medicine

## 2019-04-01 DIAGNOSIS — R21 Rash and other nonspecific skin eruption: Secondary | ICD-10-CM

## 2019-04-01 HISTORY — DX: Epilepsy, unspecified, not intractable, without status epilepticus: G40.909

## 2019-04-01 MED ORDER — BETAMETHASONE DIPROPIONATE 0.05 % EX OINT: TOPICAL_OINTMENT | Freq: Two times a day (BID) | CUTANEOUS | 0 refills | Status: DC

## 2019-04-01 MED ORDER — HYDROXYZINE HCL 25 MG PO TABS: 25.0000 mg | ORAL_TABLET | Freq: Three times a day (TID) | ORAL | 0 refills | Status: DC | PRN

## 2019-04-01 NOTE — ED Provider Notes (Signed)
[facility]    CSN: [id number] Arrival date & time: 04/01/19  1146      History   Chief Complaint Chief Complaint  Patient presents with  . Rash   HPI  39 year old female presents with rash.  Started on Saturday.  Located on the left side of the abdomen below the left breast.  Also affects the left side of the lower thoracic spine.  Rash is very itchy.  Not painful.  She feels like it is spreading.  No medications tried.  She has applied alcohol without resolution.  No new contacts or exposures.  No other associated symptoms.  No other complaints.  Past Medical History:  Diagnosis Date  . Epilepsy ([place])   . Fibroids 05/14/2015  . Rapid heart beat   . Scoliosis   . Scoliosis   . Tachycardia    Patient Active Problem List   Diagnosis Date Noted  . Migraines 06/12/2017  . Numbness 04/06/2015  . Awareness of heartbeats 12/17/2014  . Scoliosis 12/17/2014  . Leg weakness 12/17/2014  . Depression with anxiety 12/17/2014  . Insomnia 12/17/2014  . Polyneuropathy 09/10/2014  . Back muscle spasm 06/15/2014  . Idiopathic scoliosis 06/15/2014   Past Surgical History:  Procedure Laterality Date  . CHOLECYSTECTOMY    . GALLBLADDER SURGERY     OB History    Gravida  5   Para  3   Term  3   Preterm  0   AB  2   Living  3     SAB  1   TAB  1   Ectopic      Multiple      Live Births               Home Medications    Prior to Admission medications   Medication Sig Start Date End Date Taking? Authorizing Provider  lamoTRIgine 100 MG TBDP Take 1 tablet by mouth 2 (two) times daily. 03/16/19  Yes [provider]  propranolol (INDERAL) 20 MG/5ML solution Take by mouth 3 (three) times daily.   Yes [provider]  topiramate (TOPAMAX) 50 MG tablet Take 50 mg by mouth 2 (two) times daily. 03/05/19  Yes [provider]  betamethasone dipropionate (DIPROLENE) 0.05 % ointment Apply topically 2 (two) times daily. Do not use  for more than 3 weeks consecutively as it may thin and lighten skin. 04/01/19   [name], DO  hydrOXYzine (ATARAX/VISTARIL) 25 MG tablet Take 1 tablet (25 mg total) by mouth every 8 (eight) hours as needed for itching. 04/01/19   [name], DO  medroxyPROGESTERone (DEPO-PROVERA) 150 MG/ML injection Inject 150 mg into the muscle every 3 (three) months. 01/31/18 04/01/19  [provider]  omeprazole (PRILOSEC) 20 MG capsule Take 20 mg by mouth daily as needed.  04/01/19  [provider]  SUMAtriptan (IMITREX) 50 MG tablet Take 50 mg by mouth every 2 (two) hours as needed for migraine. May repeat in 2 hours if headache persists or recurs.  04/01/19  [provider]    Family History Family History  Problem Relation Age of Onset  . HIV Mother   . Anemia Mother   . Migraines Mother   . Depression Mother   . Hypertension Father   . Diabetes Father   . Hypertension Maternal Grandmother     Social History Social History   Tobacco Use  . Smoking status: Never Smoker  . Smokeless tobacco: Never Used  Substance Use  Topics  . Alcohol use: No  . Drug use: No     Allergies   Patient has no known allergies.   Review of Systems Review of Systems  Constitutional: Negative.   Skin: Positive for rash.   Physical Exam Triage Vital Signs ED Triage Vitals  Enc Vitals Group     BP 04/01/19 1215 123/88     Pulse Rate 04/01/19 1215 81     Resp 04/01/19 1215 18     Temp 04/01/19 1215 98.2 F (36.8 C)     Temp Source 04/01/19 1215 Oral     SpO2 04/01/19 1215 100 %     Weight 04/01/19 1214 136 lb (61.7 kg)     Height 04/01/19 1214 5' 6.5" (1.689 m)     Head Circumference --      Peak Flow --      Pain Score 04/01/19 1214 0     Pain Loc --      Pain Edu? --      Excl. in [place]? --    Updated Vital Signs BP 123/88 (BP Location: Left Arm)   Pulse 81   Temp 98.2 F (36.8 C) (Oral)   Resp 18   Ht 5' 6.5" (1.689 m)   Wt 61.7 kg   LMP 03/04/2019  (Approximate)   SpO2 100%   BMI 21.62 kg/m   Visual Acuity Right Eye Distance:   Left Eye Distance:   Bilateral Distance:    Right Eye Near:   Left Eye Near:    Bilateral Near:     Physical Exam Vitals and nursing note reviewed.  Constitutional:      General: She is not in acute distress.    Appearance: Normal appearance. She is not ill-appearing.  HENT:     Head: Normocephalic and atraumatic.  Eyes:     General:        Right eye: No discharge.        Left eye: No discharge.     Conjunctiva/sclera: Conjunctivae normal.  Pulmonary:     Effort: Pulmonary effort is normal. No respiratory distress.  Chest:       Comments: Erythematous papular rash noted at the labeled location.  No appreciable vesicles. Neurological:     Mental Status: She is alert.  Psychiatric:        Mood and Affect: Mood normal.        Behavior: Behavior normal.    UC Treatments / Results  Labs (all labs ordered are listed, but only abnormal results are displayed) Labs Reviewed - No data to display  EKG   Radiology No results found.  Procedures Procedures (including critical care time)  Medications Ordered in UC Medications - No data to display  Initial Impression / Assessment and Plan / UC Course  I have reviewed the triage vital signs and the nursing notes.  Pertinent labs & imaging results that were available during my care of the patient were reviewed by me and considered in my medical decision making (see chart for details).    39 year old female presents with rash.  Suspect contact/allergic/irritant in nature.  Does not appear to be infectious.  Placing empirically on betamethasone ointment as well as Atarax.  Supportive care.  Final Clinical Impressions(s) / UC Diagnoses   Final diagnoses:  Rash     Discharge Instructions     Medication as prescribed.  Call with concerns/worsening.  Take care  Dr. Lacinda Axon    ED Prescriptions    Medication Sig  Dispense Auth.  Provider   hydrOXYzine (ATARAX/VISTARIL) 25 MG tablet Take 1 tablet (25 mg total) by mouth every 8 (eight) hours as needed for itching. 30 tablet Ailie Gage G, DO   betamethasone dipropionate (DIPROLENE) 0.05 % ointment Apply topically 2 (two) times daily. Do not use for more than 3 weeks consecutively as it may thin and lighten skin. 50 g [name], DO     PDMP not reviewed this encounter.   [name], [place] 04/01/19 1231

## 2019-04-01 NOTE — ED Triage Notes (Addendum)
Patient in today c/o rash on the left side of the abdomen and going around to her left back x 3 days. Patient has not tried any OTC medications. Patient states the rash is very itchy, but not painful.

## 2019-04-01 NOTE — Discharge Instructions (Signed)
Medication as prescribed.  Call with concerns/worsening.  Take care  Dr. Lacinda Axon

## 2019-04-14 ENCOUNTER — Other Ambulatory Visit: Payer: Self-pay | Admitting: Internal Medicine

## 2019-04-14 DIAGNOSIS — Z1231 Encounter for screening mammogram for malignant neoplasm of breast: Secondary | ICD-10-CM

## 2019-04-16 ENCOUNTER — Ambulatory Visit
Admission: RE | Admit: 2019-04-16 | Discharge: 2019-04-16 | Disposition: A | Discharge status: Home / Self Care | Payer: BC Managed Care – PPO | Source: Ambulatory Visit | Attending: Internal Medicine | Admitting: Internal Medicine

## 2019-04-16 ENCOUNTER — Other Ambulatory Visit: Payer: Self-pay

## 2019-04-16 DIAGNOSIS — Z1231 Encounter for screening mammogram for malignant neoplasm of breast: Secondary | ICD-10-CM | POA: Insufficient documentation

## 2019-05-19 DIAGNOSIS — O219 Vomiting of pregnancy, unspecified: Secondary | ICD-10-CM | POA: Diagnosis not present

## 2019-05-19 DIAGNOSIS — O99351 Diseases of the nervous system complicating pregnancy, first trimester: Secondary | ICD-10-CM | POA: Diagnosis not present

## 2019-05-19 DIAGNOSIS — Z3A01 Less than 8 weeks gestation of pregnancy: Secondary | ICD-10-CM | POA: Diagnosis not present

## 2019-05-19 DIAGNOSIS — G40909 Epilepsy, unspecified, not intractable, without status epilepticus: Secondary | ICD-10-CM | POA: Diagnosis not present

## 2019-05-23 DIAGNOSIS — O26851 Spotting complicating pregnancy, first trimester: Secondary | ICD-10-CM | POA: Diagnosis not present

## 2019-05-23 DIAGNOSIS — O219 Vomiting of pregnancy, unspecified: Secondary | ICD-10-CM | POA: Diagnosis not present

## 2019-05-23 DIAGNOSIS — R1032 Left lower quadrant pain: Secondary | ICD-10-CM | POA: Diagnosis not present

## 2019-05-23 DIAGNOSIS — Z3A01 Less than 8 weeks gestation of pregnancy: Secondary | ICD-10-CM | POA: Diagnosis not present

## 2019-05-23 DIAGNOSIS — O210 Mild hyperemesis gravidarum: Secondary | ICD-10-CM | POA: Diagnosis not present

## 2019-05-24 DIAGNOSIS — O26851 Spotting complicating pregnancy, first trimester: Secondary | ICD-10-CM | POA: Diagnosis not present

## 2019-06-02 ENCOUNTER — Other Ambulatory Visit: Payer: Self-pay

## 2019-06-02 ENCOUNTER — Ambulatory Visit (LOCAL_COMMUNITY_HEALTH_CENTER): Payer: Self-pay

## 2019-06-02 VITALS — BP 109/70 | Ht 66.0 in | Wt 124.0 lb

## 2019-06-02 DIAGNOSIS — Z3201 Encounter for pregnancy test, result positive: Secondary | ICD-10-CM

## 2019-06-02 LAB — PREGNANCY, URINE: Preg Test, Ur: POSITIVE — AB

## 2019-06-02 NOTE — Progress Notes (Signed)
Patient reports recently went to ED for vomiting.  States her weight before pregnancy was 138lbs.  Last period was around the end of February. Reports ultrasound completed during ED visit and confirmed she's [redacted] weeks gestation. Plans PNC at [facility]. Declines P[facility].  No [facility] record Aileen Fass, RN

## 2019-07-03 DIAGNOSIS — O09519 Supervision of elderly primigravida, unspecified trimester: Secondary | ICD-10-CM

## 2019-07-07 DIAGNOSIS — Z113 Encounter for screening for infections with a predominantly sexual mode of transmission: Secondary | ICD-10-CM | POA: Diagnosis not present

## 2019-07-07 DIAGNOSIS — O3680X0 Pregnancy with inconclusive fetal viability, not applicable or unspecified: Secondary | ICD-10-CM | POA: Diagnosis not present

## 2019-07-07 DIAGNOSIS — G40309 Generalized idiopathic epilepsy and epileptic syndromes, not intractable, without status epilepticus: Secondary | ICD-10-CM | POA: Diagnosis not present

## 2019-07-07 DIAGNOSIS — O09519 Supervision of elderly primigravida, unspecified trimester: Secondary | ICD-10-CM | POA: Diagnosis not present

## 2019-07-07 LAB — OB RESULTS CONSOLE HEPATITIS B SURFACE ANTIGEN: Hepatitis B Surface Ag: NEGATIVE

## 2019-07-07 LAB — OB RESULTS CONSOLE VARICELLA ZOSTER ANTIBODY, IGG: Varicella: IMMUNE

## 2019-07-07 LAB — OB RESULTS CONSOLE RUBELLA ANTIBODY, IGM: Rubella: IMMUNE

## 2019-07-17 DIAGNOSIS — O26892 Other specified pregnancy related conditions, second trimester: Secondary | ICD-10-CM | POA: Diagnosis not present

## 2019-07-17 DIAGNOSIS — Z9049 Acquired absence of other specified parts of digestive tract: Secondary | ICD-10-CM | POA: Diagnosis not present

## 2019-07-17 DIAGNOSIS — O209 Hemorrhage in early pregnancy, unspecified: Secondary | ICD-10-CM | POA: Diagnosis not present

## 2019-07-17 DIAGNOSIS — O09292 Supervision of pregnancy with other poor reproductive or obstetric history, second trimester: Secondary | ICD-10-CM | POA: Diagnosis not present

## 2019-07-17 DIAGNOSIS — R110 Nausea: Secondary | ICD-10-CM | POA: Diagnosis not present

## 2019-07-17 DIAGNOSIS — Z3A15 15 weeks gestation of pregnancy: Secondary | ICD-10-CM | POA: Diagnosis not present

## 2019-07-17 DIAGNOSIS — O09522 Supervision of elderly multigravida, second trimester: Secondary | ICD-10-CM | POA: Diagnosis not present

## 2019-07-22 ENCOUNTER — Other Ambulatory Visit: Payer: Self-pay | Admitting: Family Medicine

## 2019-09-01 DIAGNOSIS — G40309 Generalized idiopathic epilepsy and epileptic syndromes, not intractable, without status epilepticus: Secondary | ICD-10-CM | POA: Diagnosis not present

## 2019-09-01 DIAGNOSIS — E559 Vitamin D deficiency, unspecified: Secondary | ICD-10-CM | POA: Diagnosis not present

## 2019-09-01 DIAGNOSIS — Z79899 Other long term (current) drug therapy: Secondary | ICD-10-CM | POA: Diagnosis not present

## 2019-09-01 DIAGNOSIS — G629 Polyneuropathy, unspecified: Secondary | ICD-10-CM | POA: Diagnosis not present

## 2019-09-01 DIAGNOSIS — E538 Deficiency of other specified B group vitamins: Secondary | ICD-10-CM | POA: Diagnosis not present

## 2019-09-01 DIAGNOSIS — G43019 Migraine without aura, intractable, without status migrainosus: Secondary | ICD-10-CM | POA: Diagnosis not present

## 2019-09-01 DIAGNOSIS — R42 Dizziness and giddiness: Secondary | ICD-10-CM | POA: Diagnosis not present

## 2019-10-09 DIAGNOSIS — Z79899 Other long term (current) drug therapy: Secondary | ICD-10-CM | POA: Diagnosis not present

## 2019-10-21 DIAGNOSIS — O09519 Supervision of elderly primigravida, unspecified trimester: Secondary | ICD-10-CM | POA: Diagnosis not present

## 2019-10-22 DIAGNOSIS — R002 Palpitations: Secondary | ICD-10-CM | POA: Diagnosis not present

## 2019-10-22 DIAGNOSIS — O3680X0 Pregnancy with inconclusive fetal viability, not applicable or unspecified: Secondary | ICD-10-CM | POA: Diagnosis not present

## 2019-10-22 DIAGNOSIS — G40309 Generalized idiopathic epilepsy and epileptic syndromes, not intractable, without status epilepticus: Secondary | ICD-10-CM | POA: Diagnosis not present

## 2019-10-27 DIAGNOSIS — R7309 Other abnormal glucose: Secondary | ICD-10-CM | POA: Diagnosis not present

## 2019-11-05 DIAGNOSIS — Z23 Encounter for immunization: Secondary | ICD-10-CM | POA: Diagnosis not present

## 2019-11-21 DIAGNOSIS — O09519 Supervision of elderly primigravida, unspecified trimester: Secondary | ICD-10-CM | POA: Diagnosis not present

## 2019-11-26 DIAGNOSIS — K649 Unspecified hemorrhoids: Secondary | ICD-10-CM | POA: Diagnosis not present

## 2019-12-12 DIAGNOSIS — O4290 Premature rupture of membranes, unspecified as to length of time between rupture and onset of labor, unspecified weeks of gestation: Secondary | ICD-10-CM | POA: Diagnosis not present

## 2019-12-12 DIAGNOSIS — N898 Other specified noninflammatory disorders of vagina: Secondary | ICD-10-CM | POA: Diagnosis not present

## 2019-12-12 DIAGNOSIS — O26893 Other specified pregnancy related conditions, third trimester: Secondary | ICD-10-CM | POA: Diagnosis not present

## 2019-12-17 DIAGNOSIS — O99891 Other specified diseases and conditions complicating pregnancy: Secondary | ICD-10-CM | POA: Diagnosis not present

## 2019-12-17 DIAGNOSIS — Z113 Encounter for screening for infections with a predominantly sexual mode of transmission: Secondary | ICD-10-CM | POA: Diagnosis not present

## 2019-12-17 DIAGNOSIS — Z3483 Encounter for supervision of other normal pregnancy, third trimester: Secondary | ICD-10-CM | POA: Diagnosis not present

## 2019-12-17 DIAGNOSIS — O09522 Supervision of elderly multigravida, second trimester: Secondary | ICD-10-CM | POA: Diagnosis not present

## 2019-12-17 DIAGNOSIS — N898 Other specified noninflammatory disorders of vagina: Secondary | ICD-10-CM | POA: Diagnosis not present

## 2019-12-17 DIAGNOSIS — Z114 Encounter for screening for human immunodeficiency virus [HIV]: Secondary | ICD-10-CM | POA: Diagnosis not present

## 2019-12-17 LAB — OB RESULTS CONSOLE GBS: GBS: NEGATIVE

## 2019-12-17 LAB — OB RESULTS CONSOLE RPR: RPR: NONREACTIVE

## 2019-12-17 LAB — OB RESULTS CONSOLE HIV ANTIBODY (ROUTINE TESTING): HIV: NONREACTIVE

## 2019-12-21 ENCOUNTER — Observation Stay
Admission: EM | Admit: 2019-12-21 | Discharge: 2019-12-21 | Disposition: A | Discharge status: Home / Self Care | Payer: Medicaid Other | Attending: Obstetrics and Gynecology | Admitting: Obstetrics and Gynecology

## 2019-12-21 ENCOUNTER — Encounter: Payer: Self-pay | Admitting: Obstetrics and Gynecology

## 2019-12-21 DIAGNOSIS — O4193X0 Disorder of amniotic fluid and membranes, unspecified, third trimester, not applicable or unspecified: Principal | ICD-10-CM | POA: Insufficient documentation

## 2019-12-21 DIAGNOSIS — Z8669 Personal history of other diseases of the nervous system and sense organs: Secondary | ICD-10-CM | POA: Diagnosis not present

## 2019-12-21 DIAGNOSIS — O99353 Diseases of the nervous system complicating pregnancy, third trimester: Secondary | ICD-10-CM | POA: Diagnosis not present

## 2019-12-21 DIAGNOSIS — Z3A36 36 weeks gestation of pregnancy: Secondary | ICD-10-CM | POA: Diagnosis not present

## 2019-12-21 DIAGNOSIS — O0993 Supervision of high risk pregnancy, unspecified, third trimester: Secondary | ICD-10-CM | POA: Diagnosis not present

## 2019-12-21 DIAGNOSIS — Z79899 Other long term (current) drug therapy: Secondary | ICD-10-CM | POA: Diagnosis not present

## 2019-12-21 DIAGNOSIS — Z3490 Encounter for supervision of normal pregnancy, unspecified, unspecified trimester: Secondary | ICD-10-CM

## 2019-12-21 DIAGNOSIS — O99891 Other specified diseases and conditions complicating pregnancy: Secondary | ICD-10-CM | POA: Diagnosis not present

## 2019-12-21 LAB — ROM PLUS (ARMC ONLY): Rom Plus: NEGATIVE

## 2019-12-21 NOTE — OB Triage Note (Signed)
Pt arrives to triage with c/o having contractions all day Saturday which increased in intensity this AM around 0135. Pt is ambulatory and appears in NAD. Pt reports possible fluid leak but is unsure. CNM McVey notified about pt's arrival and orders were given.

## 2019-12-21 NOTE — Discharge Instructions (Signed)
Labor  First Stage of Labor Labor is your body's natural process of moving your baby and other structures, including the placenta and umbilical cord, out of your uterus. There are three stages of labor. How long each stage lasts is different for every woman. But certain events happen during each stage that are the same for everyone.  The first stage starts when true labor begins. This stage ends when your cervix, which is the opening from your uterus into your vagina, is completely open (dilated).  The second stage begins when your cervix is fully dilated and you start pushing. This stage ends when your baby is born.  The third stage is the delivery of the organ that nourished your baby during pregnancy (placenta). First stage of labor As your due date gets closer, you may start to notice certain physical changes that mean labor is going to start soon. You may feel that your baby has dropped lower into your pelvis. You may experience irregular, often painless, contractions that go away when you walk around or lie down ([employer] contractions). This is also called false labor. The first stage of labor begins when you start having contractions that come at regular (evenly spaced) intervals and your cervix starts to get thinner and wider in preparation for your baby to pass through. Birth care providers measure the dilation of your cervix in centimeters (cm). One centimeter is a little less than one-half of an inch. The first stage ends when your cervix is dilated to 10 cm. The first stage of labor is divided into three phases:  Early phase.  Active phase.  Transitional phase. The length of the first stage of labor varies. It may be longer if this is your first pregnancy. You may spend most of this stage at home trying to relax and stay comfortable. How does this affect me? During the first stage of labor, you will move through three phases. What happens in the early phase?  You will start  to have regular contractions that last 30-60 seconds. Contractions may come every 5-20 minutes. Keep track of your contractions and call your birth care provider.  Your water may break during this phase.  You may notice a clear or slightly bloody discharge of mucus (mucus plug) from your vagina.  Your cervix will dilate to 3-6 cm. What happens in the active phase? The active phase usually lasts 3-5 hours. You may go to the hospital or birth center around this time. During the active phase:  Your contractions will become stronger, longer, and more uncomfortable.  Your contractions may last 45-90 seconds and come every 3-5 minutes.  You may feel lower back pain.  Your birth care providers may examine your cervix and feel your belly to find the position of your baby.  You may have a monitor strapped to your belly to measure your contractions and your baby's heart rate.  You may start using your pain management options.  Your cervix may be dilated to 6 cm and may start to dilate more quickly. What happens in the transitional phase? The transitional phase typically lasts from 30 minutes to 2 hours. At the end of this phase, your cervix will be fully dilated to 10 cm. During the transitional phase:  Contractions will get stronger and longer.  Contractions may last 60-90 seconds and come less than 2 minutes apart.  You may feel hot flashes, chills, or nausea. How does this affect my baby? During the first stage of labor, your  baby will gradually move down into your birth canal. Follow these instructions at home and in the hospital or birth center:   When labor first begins, try to stay calm. You are still in the early phase. If it is night, try to get some sleep. If it is day, try to relax and save your energy. You may want to make some calls and get ready to go to the hospital or birth center.  When you are in the early phase, try these methods to help ease discomfort: ? Deep  breathing and muscle relaxation. ? Taking a walk. ? Taking a warm bath or shower.  Drink some fluids and have a light snack if you feel like it.  Keep track of your contractions.  Based on the plan you created with your birth care provider, call when your contractions indicate it is time.  If your water breaks, note the time, color, and odor of the fluid.  When you are in the active phase, do your breathing exercises and rely on your support people and your team of birth care providers. Contact a health care provider if:  Your contractions are strong and regular.  You have lower back pain or cramping.  Your water breaks.  You lose your mucus plug. Get help right away if you:  Have a severe headache that does not go away.  Have changes in your vision.  Have severe pain in your upper belly.  Do not feel the baby move.  Have bright red bleeding. Summary  The first stage of labor starts when true labor begins, and it ends when your cervix is dilated to 10 cm.  The first stage of labor has three phases: early, active, and transitional.  Your baby moves into the birth canal during the first stage of labor.  You may have contractions that become stronger and longer. You may also lose your mucus plug and have your water break.  Call your birth care provider when your contractions are frequent and strong enough to go to the hospital or birth center. This information is not intended to replace advice given to you by your health care provider. Make sure you discuss any questions you have with your health care provider. Document Revised: 05/23/2018 Document Reviewed: 04/15/2017 Elsevier Patient Education  [place].

## 2019-12-21 NOTE — Discharge Summary (Signed)
Alexandra Heath is a 39 y.o. female. She is at [redacted]w[redacted]d gestation. Patient's last menstrual period was 04/15/2019. Pregnancy dating based on [place] at [redacted]w[redacted]d.  Estimated Date of Delivery: 01/13/20  Prenatal care site: [facility]  Current pregnancy complicated by:  1. Epilepsy, Lamictal 150mg  PO BID; managed by [facility].  2. Advanced Maternal age  Chief complaint: leaking fluid and irreg UCs.    Maternal Medical History:   Past Medical History:  Diagnosis Date  . Epilepsy ([facility])   . Fibroids 05/14/2015  . Rapid heart beat   . Scoliosis   . Scoliosis   . Tachycardia     Past Surgical History:  Procedure Laterality Date  . CHOLECYSTECTOMY    . GALLBLADDER SURGERY      No Known Allergies  Prior to Admission medications   Medication Sig Start Date End Date Taking? Authorizing Provider  betamethasone dipropionate (DIPROLENE) 0.05 % ointment Apply topically 2 (two) times daily. Do not use for more than 3 weeks consecutively as it may thin and lighten skin. 04/01/19   [name], DO  hydrOXYzine (ATARAX/VISTARIL) 25 MG tablet Take 1 tablet (25 mg total) by mouth every 8 (eight) hours as needed for itching. 04/01/19   [name], DO  lamoTRIgine 100 MG TBDP Take 1 tablet by mouth 2 (two) times daily. 03/16/19   [provider]  propranolol (INDERAL) 20 MG/5ML solution Take by mouth 3 (three) times daily.    [provider]  topiramate (TOPAMAX) 50 MG tablet Take 50 mg by mouth 2 (two) times daily. 03/05/19   [provider]  medroxyPROGESTERone (DEPO-PROVERA) 150 MG/ML injection Inject 150 mg into the muscle every 3 (three) months. 01/31/18 04/01/19  [provider]  omeprazole (PRILOSEC) 20 MG capsule Take 20 mg by mouth daily as needed.  04/01/19  [provider]  SUMAtriptan (IMITREX) 50 MG tablet Take 50 mg by mouth every 2 (two) hours as needed for migraine. May repeat in 2 hours if headache persists or recurs.  04/01/19  [provider]      Social History: She  reports that she has never smoked. She has never used smokeless tobacco. She reports that she does not drink alcohol and does not use drugs.  Family History: family history includes Anemia in her mother; Depression in her mother; Diabetes in her father; HIV in her mother; Hypertension in her father and maternal grandmother; Migraines in her mother.   Review of Systems: A full review of systems was performed and negative except as noted in the HPI.     O:  BP 126/77 (BP Location: Right Arm)   Pulse 84   Temp 98.2 F (36.8 C) (Oral)   Resp 18   Ht 5\' 6"  (1.676 m)   LMP 04/15/2019   SpO2 100%   BMI 20.01 kg/m  Results for orders placed or performed during the hospital encounter of 12/21/19 (from the past 48 hour(s))  ROM Plus ([place] only)   Collection Time: 12/21/19  2:39 AM  Result Value Ref Range   Rom Plus NEGATIVE      Dilation: 2 Effacement (%): 50 Cervical Position: Posterior Station: -3 Presentation: Vertex Exam by:: Coral Ceo RN   Fetal  monitoring: Cat I Appropriate for GA Baseline: 135bpm Variability: moderate Accelerations: present x >2 Decelerations absent  TOCO: Irreg UCs with UI    A/P: 39 y.o. [id number] at [redacted]w[redacted]d here for antenatal surveillance for suspected ROM not found.   Principle  Diagnosis:  Suspected ROM not found, [redacted]w[redacted]d   Preterm labor: not present.   Fetal Wellbeing: Reassuring Cat 1 tracing with reactive NST   D/c home stable, precautions reviewed, follow-up as scheduled.    Francetta Found, CNM 12/21/2019  9:47 AM

## 2019-12-26 ENCOUNTER — Other Ambulatory Visit: Payer: Self-pay

## 2019-12-26 ENCOUNTER — Inpatient Hospital Stay: Payer: Medicaid Other | Admitting: Anesthesiology

## 2019-12-26 ENCOUNTER — Inpatient Hospital Stay
Admission: EM | Admit: 2019-12-26 | Discharge: 2019-12-27 | DRG: 807 | Disposition: A | Discharge status: Home / Self Care | Payer: Medicaid Other

## 2019-12-26 DIAGNOSIS — R030 Elevated blood-pressure reading, without diagnosis of hypertension: Secondary | ICD-10-CM | POA: Diagnosis present

## 2019-12-26 DIAGNOSIS — Z79899 Other long term (current) drug therapy: Secondary | ICD-10-CM | POA: Diagnosis not present

## 2019-12-26 DIAGNOSIS — O99815 Abnormal glucose complicating the puerperium: Secondary | ICD-10-CM | POA: Diagnosis not present

## 2019-12-26 DIAGNOSIS — Z3A38 38 weeks gestation of pregnancy: Secondary | ICD-10-CM

## 2019-12-26 DIAGNOSIS — Z20822 Contact with and (suspected) exposure to covid-19: Secondary | ICD-10-CM | POA: Diagnosis present

## 2019-12-26 DIAGNOSIS — R002 Palpitations: Secondary | ICD-10-CM | POA: Diagnosis present

## 2019-12-26 DIAGNOSIS — O99355 Diseases of the nervous system complicating the puerperium: Secondary | ICD-10-CM | POA: Diagnosis not present

## 2019-12-26 DIAGNOSIS — O99354 Diseases of the nervous system complicating childbirth: Secondary | ICD-10-CM | POA: Diagnosis present

## 2019-12-26 DIAGNOSIS — O26893 Other specified pregnancy related conditions, third trimester: Secondary | ICD-10-CM | POA: Diagnosis present

## 2019-12-26 DIAGNOSIS — O09519 Supervision of elderly primigravida, unspecified trimester: Secondary | ICD-10-CM

## 2019-12-26 DIAGNOSIS — O09522 Supervision of elderly multigravida, second trimester: Secondary | ICD-10-CM | POA: Diagnosis present

## 2019-12-26 DIAGNOSIS — G40909 Epilepsy, unspecified, not intractable, without status epilepticus: Secondary | ICD-10-CM | POA: Diagnosis present

## 2019-12-26 DIAGNOSIS — O135 Gestational [pregnancy-induced] hypertension without significant proteinuria, complicating the puerperium: Secondary | ICD-10-CM | POA: Diagnosis not present

## 2019-12-26 LAB — COMPREHENSIVE METABOLIC PANEL
ALT: 25 U/L (ref 0–44)
AST: 27 U/L (ref 15–41)
Albumin: 3 g/dL — ABNORMAL LOW (ref 3.5–5.0)
Alkaline Phosphatase: 160 U/L — ABNORMAL HIGH (ref 38–126)
Anion gap: 8 (ref 5–15)
BUN: 12 mg/dL (ref 6–20)
CO2: 23 mmol/L (ref 22–32)
Calcium: 8.9 mg/dL (ref 8.9–10.3)
Chloride: 101 mmol/L (ref 98–111)
Creatinine, Ser: 0.94 mg/dL (ref 0.44–1.00)
GFR, Estimated: >60 mL/min (ref >60)
Glucose, Bld: 93 mg/dL (ref 70–99)
Potassium: 3.6 mmol/L (ref 3.5–5.1)
Sodium: 132 mmol/L — ABNORMAL LOW (ref 135–145)
Total Bilirubin: 0.6 mg/dL (ref 0.3–1.2)
Total Protein: 7.7 g/dL (ref 6.5–8.1)

## 2019-12-26 LAB — CBC
HCT: 31.2 % — ABNORMAL LOW (ref 36.0–46.0)
Hemoglobin: 10.3 g/dL — ABNORMAL LOW (ref 12.0–15.0)
MCH: 28.3 pg (ref 26.0–34.0)
MCHC: 33 g/dL (ref 30.0–36.0)
MCV: 85.7 fL (ref 80.0–100.0)
Platelets: 314 10*3/uL (ref 150–400)
RBC: 3.64 MIL/uL — ABNORMAL LOW (ref 3.87–5.11)
RDW: 12.8 % (ref 11.5–15.5)
WBC: 7.2 10*3/uL (ref 4.0–10.5)
nRBC: 0.3 % — ABNORMAL HIGH (ref 0.0–0.2)

## 2019-12-26 LAB — RESPIRATORY PANEL BY RT PCR (FLU A&B, COVID)
Influenza A by PCR: NEGATIVE
Influenza B by PCR: NEGATIVE
SARS Coronavirus 2 by RT PCR: NEGATIVE

## 2019-12-26 LAB — TYPE AND SCREEN
ABO/RH(D): A POS
Antibody Screen: NEGATIVE

## 2019-12-26 LAB — PROTEIN / CREATININE RATIO, URINE
Creatinine, Urine: 69 mg/dL
Protein Creatinine Ratio: 0.22 mg/mg{Cre} — ABNORMAL HIGH (ref 0.00–0.15)
Total Protein, Urine: 15 mg/dL

## 2019-12-26 LAB — RPR: RPR Ser Ql: NONREACTIVE

## 2019-12-26 MED ORDER — DIPHENHYDRAMINE HCL 25 MG PO CAPS: 25.0000 mg | ORAL_CAPSULE | Freq: Four times a day (QID) | ORAL | Status: DC | PRN

## 2019-12-26 MED ORDER — WITCH HAZEL-GLYCERIN EX PADS
1.0000 "application " | MEDICATED_PAD | CUTANEOUS | Status: DC
  Administered 2019-12-26: 1 via TOPICAL
  Filled 2019-12-26: qty 100

## 2019-12-26 MED ORDER — LACTATED RINGERS IV SOLN: INTRAVENOUS | Status: DC

## 2019-12-26 MED ORDER — SIMETHICONE 80 MG PO CHEW: 80.0000 mg | CHEWABLE_TABLET | ORAL | Status: DC | PRN

## 2019-12-26 MED ORDER — BENZOCAINE-MENTHOL 20-0.5 % EX AERO: 1.0000 "application " | INHALATION_SPRAY | CUTANEOUS | Status: DC | PRN

## 2019-12-26 MED ORDER — COCONUT OIL OIL: 1.0000 "application " | TOPICAL_OIL | Status: DC | PRN

## 2019-12-26 MED ORDER — ACETAMINOPHEN 500 MG PO TABS
1000.0000 mg | ORAL_TABLET | Freq: Four times a day (QID) | ORAL | Status: DC | PRN
  Administered 2019-12-26: 1000 mg via ORAL
  Filled 2019-12-26: qty 2

## 2019-12-26 MED ORDER — PRENATAL MULTIVITAMIN CH
1.0000 | ORAL_TABLET | Freq: Every day | ORAL | Status: DC
  Administered 2019-12-26 – 2019-12-27 (×2): 1 via ORAL
  Filled 2019-12-26 (×2): qty 1

## 2019-12-26 MED ORDER — LIDOCAINE HCL (PF) 1 % IJ SOLN
INTRAMUSCULAR | Status: AC
  Filled 2019-12-26: qty 30

## 2019-12-26 MED ORDER — LIDOCAINE HCL (PF) 1 % IJ SOLN
INTRAMUSCULAR | Status: DC | PRN
  Administered 2019-12-26: 1 mL via SUBCUTANEOUS

## 2019-12-26 MED ORDER — LIDOCAINE-EPINEPHRINE (PF) 1.5 %-1:200000 IJ SOLN
INTRAMUSCULAR | Status: DC | PRN
  Administered 2019-12-26: 3 mL via EPIDURAL

## 2019-12-26 MED ORDER — EPHEDRINE 5 MG/ML INJ: 10.0000 mg | INTRAVENOUS | Status: DC | PRN

## 2019-12-26 MED ORDER — IBUPROFEN 600 MG PO TABS
600.0000 mg | ORAL_TABLET | Freq: Four times a day (QID) | ORAL | Status: DC
  Administered 2019-12-26 – 2019-12-27 (×4): 600 mg via ORAL
  Filled 2019-12-26 (×4): qty 1

## 2019-12-26 MED ORDER — ONDANSETRON HCL 4 MG PO TABS: 4.0000 mg | ORAL_TABLET | ORAL | Status: DC | PRN

## 2019-12-26 MED ORDER — DIBUCAINE (PERIANAL) 1 % EX OINT: 1.0000 "application " | TOPICAL_OINTMENT | CUTANEOUS | Status: DC | PRN

## 2019-12-26 MED ORDER — LACTATED RINGERS IV SOLN: 500.0000 mL | Freq: Once | INTRAVENOUS | Status: DC

## 2019-12-26 MED ORDER — LAMOTRIGINE 100 MG PO TABS
100.0000 mg | ORAL_TABLET | Freq: Two times a day (BID) | ORAL | Status: DC
  Administered 2019-12-26 – 2019-12-27 (×3): 100 mg via ORAL
  Filled 2019-12-26 (×4): qty 1

## 2019-12-26 MED ORDER — PROPRANOLOL HCL 20 MG/5ML PO SOLN
40.0000 mg | Freq: Two times a day (BID) | ORAL | Status: DC
  Administered 2019-12-26 – 2019-12-27 (×3): 40 mg via ORAL
  Filled 2019-12-26 (×4): qty 10

## 2019-12-26 MED ORDER — FENTANYL CITRATE (PF) 100 MCG/2ML IJ SOLN: 50.0000 ug | INTRAMUSCULAR | Status: DC | PRN

## 2019-12-26 MED ORDER — OXYTOCIN 10 UNIT/ML IJ SOLN
INTRAMUSCULAR | Status: AC
  Filled 2019-12-26: qty 2

## 2019-12-26 MED ORDER — DOCUSATE SODIUM 100 MG PO CAPS
100.0000 mg | ORAL_CAPSULE | Freq: Two times a day (BID) | ORAL | Status: DC
  Administered 2019-12-26: 100 mg via ORAL
  Filled 2019-12-26 (×2): qty 1

## 2019-12-26 MED ORDER — OXYTOCIN BOLUS FROM INFUSION
333.0000 mL | Freq: Once | INTRAVENOUS | Status: AC
  Administered 2019-12-26: 333 mL via INTRAVENOUS

## 2019-12-26 MED ORDER — DIPHENHYDRAMINE HCL 50 MG/ML IJ SOLN: 12.5000 mg | INTRAMUSCULAR | Status: DC | PRN

## 2019-12-26 MED ORDER — SODIUM CHLORIDE 0.9 % IV SOLN
INTRAVENOUS | Status: DC | PRN
  Administered 2019-12-26 (×2): 5 mL via EPIDURAL

## 2019-12-26 MED ORDER — LACTATED RINGERS IV SOLN: 500.0000 mL | INTRAVENOUS | Status: DC | PRN

## 2019-12-26 MED ORDER — OXYTOCIN-SODIUM CHLORIDE 30-0.9 UT/500ML-% IV SOLN
2.5000 [IU]/h | INTRAVENOUS | Status: DC
  Administered 2019-12-26: 2.5 [IU]/h via INTRAVENOUS
  Filled 2019-12-26: qty 500

## 2019-12-26 MED ORDER — PHENYLEPHRINE 40 MCG/ML (10ML) SYRINGE FOR IV PUSH (FOR BLOOD PRESSURE SUPPORT): 80.0000 ug | PREFILLED_SYRINGE | INTRAVENOUS | Status: DC | PRN

## 2019-12-26 MED ORDER — FENTANYL 2.5 MCG/ML W/ROPIVACAINE 0.15% IN NS 100 ML EPIDURAL (ARMC)
EPIDURAL | Status: AC
  Filled 2019-12-26: qty 100

## 2019-12-26 MED ORDER — ONDANSETRON HCL 4 MG/2ML IJ SOLN: 4.0000 mg | INTRAMUSCULAR | Status: DC | PRN

## 2019-12-26 MED ORDER — MISOPROSTOL 200 MCG PO TABS
ORAL_TABLET | ORAL | Status: AC
  Filled 2019-12-26: qty 4

## 2019-12-26 MED ORDER — ONDANSETRON HCL 4 MG/2ML IJ SOLN: 4.0000 mg | Freq: Four times a day (QID) | INTRAMUSCULAR | Status: DC | PRN

## 2019-12-26 MED ORDER — AMMONIA AROMATIC IN INHA
RESPIRATORY_TRACT | Status: AC
  Filled 2019-12-26: qty 10

## 2019-12-26 MED ORDER — LIDOCAINE HCL (PF) 1 % IJ SOLN: 30.0000 mL | INTRAMUSCULAR | Status: DC | PRN

## 2019-12-26 MED ORDER — ACETAMINOPHEN 500 MG PO TABS: 1000.0000 mg | ORAL_TABLET | Freq: Four times a day (QID) | ORAL | Status: DC | PRN

## 2019-12-26 MED ORDER — FENTANYL 2.5 MCG/ML W/ROPIVACAINE 0.15% IN NS 100 ML EPIDURAL (ARMC)
12.0000 mL/h | EPIDURAL | Status: DC
  Administered 2019-12-26: 12 mL/h via EPIDURAL

## 2019-12-26 MED ORDER — CALCIUM CARBONATE ANTACID 500 MG PO CHEW: 400.0000 mg | CHEWABLE_TABLET | Freq: Three times a day (TID) | ORAL | Status: DC | PRN

## 2019-12-26 NOTE — H&P (Signed)
OB History & Physical   History of Present Illness:  Chief Complaint:   HPI:  TAHESHA SKEET is a 39 y.o. [id number] female at [redacted]w[redacted]d dated by [place] at [redacted]w[redacted]d, not c/w with LMP of 04/11/2019.  She presents to L&D for painful contractions   Reports active fetal movement  Contractions: every 3 to 5 minutes LOF/SROM: Denies  Vaginal bleeding: Denies   Pregnancy Issues: 1. Epilepsy - takes Lamictal, managed by [facility]  2. Advanced maternal age - 39 years old at time of delivery  3. Elevated 1hr GTT - 3hr WNL   Patient Active Problem List   Diagnosis Date Noted  . Indication for care in labor or delivery 12/26/2019  . AMA (advanced maternal age) multigravida 32+, second trimester 12/26/2019  . Encounter for supervision of high risk pregnancy due to advanced maternal age in primigravida 07/03/2019  . 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 Migraines 06/12/2017  . Numbness 04/06/2015  . Palpitations 12/17/2014  . Scoliosis 12/17/2014  . Leg weakness 12/17/2014  . Depression with anxiety 12/17/2014  . Insomnia 12/17/2014  . Polyneuropathy 09/10/2014  . Back muscle spasm 06/15/2014  . Idiopathic scoliosis 06/15/2014     Maternal Medical History:   Past Medical History:  Diagnosis Date  . Epilepsy ([place])   . Fibroids 05/14/2015  . Rapid heart beat   . Scoliosis   . Scoliosis   . Tachycardia     Past Surgical History:  Procedure Laterality Date  . CHOLECYSTECTOMY    . GALLBLADDER SURGERY      No Known Allergies  Prior to Admission medications   Medication Sig Start Date End Date Taking? Authorizing Provider  lamoTRIgine 100 MG TBDP Take 1 tablet by mouth 2 (two) times daily. 03/16/19  Yes [provider]  ondansetron (ZOFRAN-ODT) 4 MG disintegrating tablet Take 4 mg by mouth every 8 (eight) hours as needed for nausea/vomiting. 09/19/19  Yes [provider]  propranolol (INDERAL) 20 MG/5ML solution Take by mouth  3 (three) times daily.   Yes [provider]  betamethasone dipropionate (DIPROLENE) 0.05 % ointment Apply topically 2 (two) times daily. Do not use for more than 3 weeks consecutively as it may thin and lighten skin. 04/01/19   [name], DO  hydrOXYzine (ATARAX/VISTARIL) 25 MG tablet Take 1 tablet (25 mg total) by mouth every 8 (eight) hours as needed for itching. Patient not taking: Reported on 12/26/2019 04/01/19   [name], DO  topiramate (TOPAMAX) 50 MG tablet Take 50 mg by mouth 2 (two) times daily. Patient not taking: Reported on 12/26/2019 03/05/19   [provider]  medroxyPROGESTERone (DEPO-PROVERA) 150 MG/ML injection Inject 150 mg into the muscle every 3 (three) months. 01/31/18 04/01/19  [provider]  omeprazole (PRILOSEC) 20 MG capsule Take 20 mg by mouth daily as needed.  04/01/19  [provider]  SUMAtriptan (IMITREX) 50 MG tablet Take 50 mg by mouth every 2 (two) hours as needed for migraine. May repeat in 2 hours if headache persists or recurs.  04/01/19  [provider]     Prenatal care site:  [facility]  Social History: She  reports that she has never smoked. She has never used smokeless tobacco. She reports that she does not drink alcohol and does not use drugs.  Family History: family history includes Anemia in her mother; Depression in her mother; Diabetes in her father; HIV in her mother; Hypertension in her father and  maternal grandmother; Migraines in her mother.   Review of Systems: A full review of systems was performed and negative except as noted in the HPI.     Physical Exam:  Vital Signs: BP (!) 151/81 (BP Location: Right Arm)   Pulse 80   Temp 98.2 F (36.8 C) (Oral)   Resp 20   Ht 5\' 6"  (1.676 m)   Wt 64 kg   LMP 04/11/2019 (Approximate)   SpO2 100%   BMI 22.76 kg/m  Physical Exam  General: breathing well with contractions  HEENT: normocephalic, atraumatic Heart: regular rate &  rhythm.  No murmurs/rubs/gallops Lungs: clear to auscultation bilaterally, normal respiratory effort Abdomen: soft, gravid, non-tender;  EFW: 2865 grams on 12/17/2019 Pelvic:   External: Normal external female genitalia  Cervix: 4-5/80/-2 by RN   Extremities: non-tender, symmetric, no edema bilaterally.  DTRs: 2+/2+  Neurologic: Alert & oriented x 3.    No results found for this or any previous visit (from the past 24 hour(s)).  Pertinent Results:  Prenatal Labs: Blood type/Rh A pos   Antibody screen neg  Rubella Immune  Varicella Immune  RPR NR  HBsAg Neg  HIV NR  GC neg  Chlamydia neg  Genetic screening negative  1 hour GTT 162  3 hour GTT 88, 155, 147, 118  GBS Neg    FHT: Baseline: 140 bpm, Variability: moderate, Accelerations: present and Decelerations: Absent TOCO: regular, every 2-3 minutes, moderate to palpation  SVE: 8-1/85/-6   Cephalic by Leopolds and SVE   No results found.  Assessment:  SYRENITY KLEPACKI is a 39 y.o. [id number] female at [redacted]w[redacted]d with active labor.   Plan:  1. Admit to Labor & Delivery; consents reviewed and obtained - Covid admission screen   2. Fetal Well being  - Fetal Tracing: Cat 1 - Group B Streptococcus ppx not indicated: GBS neg - Presentation: cephalic confirmed by SVE  - Cephalic by [place] in office on 12/17/19   3. Routine OB: - Prenatal labs reviewed, as above - Rh pos - CBC, T&S, RPR on admit - Clear fluids, IVF  4. Monitoring of labor  -  Contractions monitored with external toco -  Pelvis adequate for trial of labor  -  Plan for expectant management  -  Plan for  continuous fetal monitoring -  Maternal pain control as desired; planning regional anesthesia - Anticipate vaginal delivery  5. Post Partum Planning: - Infant feeding: Breast and formula  - Contraception: TBD - Tdap vaccine: given 11/05/2019 - Flu vaccine: Declined   Minda Meo, CNM 12/26/19 12:30 AM  Drinda Butts, CNM Certified Nurse Midwife [facility]

## 2019-12-26 NOTE — Progress Notes (Signed)
Post Partum Day 0  Subjective: Doing well, no concerns. Ambulating without difficulty, pain managed with PO meds, tolerating regular diet, and voiding without difficulty.   No fever/chills, chest pain, shortness of breath, nausea/vomiting, or leg pain. No nipple or breast pain.   Objective: BP 114/74 (BP Location: Right Arm)   Pulse 76   Temp 98.8 F (37.1 C) (Oral)   Resp 20   Ht 5\' 6"  (1.676 m)   Wt 64 kg   LMP 04/11/2019 (Approximate)   SpO2 99% Comment: Room Air  Breastfeeding Unknown   BMI 22.76 kg/m    Physical Exam:  General: alert and cooperative CV: RRR Pulm: nl effort   Edinburgh:  Edinburgh Postnatal Depression Scale Screening Tool 12/26/2019 12/26/2019  I have been able to laugh and see the funny side of things. 0 (No Data)  I have looked forward with enjoyment to things. 0 -  I have blamed myself unnecessarily when things went wrong. 0 -  I have been anxious or worried for no good reason. 2 -  I have felt scared or panicky for no good reason. 0 -  Things have been getting on top of me. 0 -  I have been so unhappy that I have had difficulty sleeping. 0 -  I have felt sad or miserable. 1 -  I have been so unhappy that I have been crying. 1 -  The thought of harming myself has occurred to me. 0 -  Edinburgh Postnatal Depression Scale Total 4 -     Recent Labs    12/26/19 0044  HGB 10.3*  HCT 31.2*  WBC 7.2  PLT 314    Assessment/Plan: 39 y.o. [id number] postpartum day # 0  -Continue New PP Lamictal dose 100mg  BID (per neurology) for seizure disorder -Continue Propranolol 40mg  BID for palpitations -Continue routine postpartum care -Hgb scheduled in am -Immunization status: Needs flu prior to discharge  Disposition: Continue inpatient postpartum care    LOS: 0 days   Chief Walkup, CNM 12/26/2019, 6:59 PM

## 2019-12-26 NOTE — Anesthesia Preprocedure Evaluation (Signed)
Anesthesia Evaluation  Patient identified by MRN, date of birth, ID band Patient awake    Reviewed: Allergy & Precautions, H&P , NPO status , Patient's Chart, lab work & pertinent test results  History of Anesthesia Complications Negative for: history of anesthetic complications  Airway Mallampati: III  TM Distance: >3 FB Neck ROM: full    Dental  (+) Chipped   Pulmonary neg pulmonary ROS, neg shortness of breath,    Pulmonary exam normal        Cardiovascular Exercise Tolerance: Good (-) hypertensionNormal cardiovascular exam+ dysrhythmias      Neuro/Psych  Headaches, Seizures -,  PSYCHIATRIC DISORDERS  Neuromuscular disease    GI/Hepatic negative GI ROS, neg GERD  ,  Endo/Other    Renal/GU   negative genitourinary   Musculoskeletal   Abdominal   Peds  Hematology negative hematology ROS (+)   Anesthesia Other Findings Past Medical History: No date: Epilepsy ([place]) 05/14/2015: Fibroids No date: Rapid heart beat No date: Scoliosis No date: Scoliosis No date: Tachycardia  Past Surgical History: No date: CHOLECYSTECTOMY No date: GALLBLADDER SURGERY  BMI    Body Mass Index: 22.76 kg/m      Reproductive/Obstetrics (+) Pregnancy                             Anesthesia Physical Anesthesia Plan  ASA: III  Anesthesia Plan: Epidural   Post-op Pain Management:    Induction:   PONV Risk Score and Plan:   Airway Management Planned: Natural Airway  Additional Equipment:   Intra-op Plan:   Post-operative Plan:   Informed Consent: I have reviewed the patients History and Physical, chart, labs and discussed the procedure including the risks, benefits and alternatives for the proposed anesthesia with the patient or authorized representative who has indicated his/her understanding and acceptance.     Dental Advisory Given  Plan Discussed with: Anesthesiologist  Anesthesia  Plan Comments: (Patient reports no bleeding problems and no anticoagulant use.   Patient consented for risks of anesthesia including but not limited to:  - adverse reactions to medications - risk of bleeding, infection and or nerve damage from epidural that could lead to paralysis - risk of headache or failed epidural - nerve damage due to positioning - Damage to heart, brain, lungs, other parts of body or loss of life  Patient voiced understanding.)        Anesthesia Quick Evaluation

## 2019-12-26 NOTE — Discharge Summary (Signed)
Obstetrical Discharge Summary  Patient Name: Alexandra Heath DOB: 07-Jun-1980 MRN: [id number]  Date of Admission: 12/26/2019 Date of Delivery: 12/26/2019 Delivered by: Drinda Butts, CNM  Date of Discharge: 11/13//21  Primary OB: [facility] [id number] last menstrual period was 04/11/2019 (approximate). EDC Estimated Date of Delivery: 01/09/20 Gestational Age at Delivery: [redacted]w[redacted]d   Antepartum complications:  1. Epilepsy - takes lamictal, managed by [facility] neuro 2. Advanced maternal age - 39 years old at time of delivery 3. Elevated 1hr GTT - 3hr WNL 4. Elevated b/p without diagnosis of hypertension - elevated b/p x 1, subsequent b/p WNL  Admitting Diagnosis: active labor  Secondary Diagnosis: Patient Active Problem List   Diagnosis Date Noted  . Indication for care in labor or delivery 12/26/2019  . AMA (advanced maternal age) multigravida 62+, second trimester 12/26/2019  . Elevated blood-pressure reading without diagnosis of hypertension 12/26/2019  . Encounter for supervision of high risk pregnancy due to advanced maternal age in primigravida 07/03/2019  . 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 Migraines 06/12/2017  . Numbness 04/06/2015  . Palpitations 12/17/2014  . Scoliosis 12/17/2014  . Leg weakness 12/17/2014  . Depression with anxiety 12/17/2014  . Insomnia 12/17/2014  . Polyneuropathy 09/10/2014  . Back muscle spasm 06/15/2014  . Idiopathic scoliosis 06/15/2014    Augmentation: N/A Complications: None Intrapartum complications/course: Dajanay presented to L&D with progressively more painful contractions.  She had one elevated blood pressure but subsequent blood pressures were normal.  Preeclampsia labs were normal with exception of creatinine of 0.94.  She was expectantly managed and progressed to C/C/+2 with SROM for moderate meconium and spontaneous urge to push.  She pushed effectively over 1 contractions  for a spontaneous birth. Delivery Type: spontaneous vaginal delivery Anesthesia: epidural Placenta: spontaneous Laceration: None Episiotomy: none Newborn Data: Live born female "Lennox Grumbles" Birth Weight:  2800 gm APGAR: 8, 9  Newborn Delivery   Birth date/time: 12/26/2019 04:11:00 Delivery type: Vaginal, Spontaneous     Postpartum Procedures:none Edinburgh:  Edinburgh Postnatal Depression Scale Screening Tool 12/26/2019 12/26/2019  I have been able to laugh and see the funny side of things. 0 (No Data)  I have looked forward with enjoyment to things. 0 -  I have blamed myself unnecessarily when things went wrong. 0 -  I have been anxious or worried for no good reason. 2 -  I have felt scared or panicky for no good reason. 0 -  Things have been getting on top of me. 0 -  I have been so unhappy that I have had difficulty sleeping. 0 -  I have felt sad or miserable. 1 -  I have been so unhappy that I have been crying. 1 -  The thought of harming myself has occurred to me. 0 -  Edinburgh Postnatal Depression Scale Total 4 -     Post partum course:  Patient had an uncomplicated postpartum course.  By time of discharge on PPD#1, her pain was controlled on oral pain medications; she had appropriate lochia and was ambulating, voiding without difficulty and tolerating regular diet.  She was deemed stable for discharge to home.   She was restarted on LAmictal and Propranolol Discharge Physical Exam:  BP 117/75 (BP Location: Right Arm)   Pulse 70   Temp 98.4 F (36.9 C) (Oral)   Resp 18   Ht 5\' 6"  (1.676 m)   Wt 64 kg   LMP 04/11/2019 (Approximate)   SpO2 100%   Breastfeeding Unknown  BMI 22.76 kg/m   General: NAD CV: RRR Pulm: CTABL, nl effort ABD: s/nd/nt, fundus firm and below the umbilicus Lochia: moderate DVT Evaluation: LE non-ttp, no evidence of DVT on exam.  Hemoglobin  Date Value Ref Range Status  12/27/2019 9.2 (L) 12.0 - 15.0 g/dL Final   HGB  Date Value Ref  Range Status  01/18/2014 12.6 12.0 - 16.0 g/dL Final   HCT  Date Value Ref Range Status  12/27/2019 27.4 (L) 36 - 46 % Final  01/18/2014 38.6 35.0 - 47.0 % Final     Disposition: stable, discharge to home. Baby Feeding: formula Baby Disposition: home with mom  Rh Immune globulin given: Rh Pos Rubella vaccine given: Immune Varivax vaccine given: Immune Flu vaccine given in AP or PP setting: declined  Tdap vaccine given in AP or PP setting: given 11/05/2019  Contraception:undecided  Prenatal Labs:  Blood type/Rh A Pos   Antibody screen neg  Rubella Immune  Varicella Immune  RPR NR  HBsAg Neg  HIV NR  GC neg  Chlamydia neg  Genetic screening negative  1 hour GTT 162  3 hour GTT 88, 155, 147, 118  GBS Neg    Plan:  [name] was discharged to home in good condition. Follow-up appointment with delivering provider in 6 weeks.  Discharge Medications: Allergies as of 12/27/2019   No Known Allergies     Medication List    TAKE these medications   betamethasone dipropionate 0.05 % ointment Commonly known as: DIPROLENE Apply topically 2 (two) times daily. Do not use for more than 3 weeks consecutively as it may thin and lighten skin.   ibuprofen 800 MG tablet Commonly known as: ADVIL Take 1 tablet (800 mg total) by mouth every 8 (eight) hours as needed.   ibuprofen 800 MG tablet Commonly known as: ADVIL Take 1 tablet (800 mg total) by mouth every 8 (eight) hours as needed.   lamoTRIgine 100 MG Tbdp Take 1 tablet by mouth 2 (two) times daily.   propranolol 20 MG/5ML solution Commonly known as: INDERAL Take by mouth 3 (three) times daily.        Follow-up Information    Minda Meo, CNM. Schedule an appointment as soon as possible for a visit in 6 week(s).   Specialty: Certified Nurse Midwife Why: postpartum visit  Contact information: [address] [place] [address] [phone]               Signed: Simms refresh and delete this line

## 2019-12-26 NOTE — Anesthesia Procedure Notes (Signed)
Epidural Patient location during procedure: OB Start time: 12/26/2019 1:38 AM End time: 12/26/2019 1:42 AM  Staffing Anesthesiologist: Harl Wiechmann, Precious Haws, MD Performed: anesthesiologist   Preanesthetic Checklist Completed: patient identified, IV checked, site marked, risks and benefits discussed, surgical consent, monitors and equipment checked, pre-op evaluation and timeout performed  Epidural Patient position: sitting Prep: ChloraPrep Patient monitoring: heart rate, continuous pulse ox and blood pressure Approach: midline Location: L3-L4 Injection technique: LOR saline  Needle:  Needle type: Tuohy  Needle gauge: 17 G Needle length: 9 cm and 9 Needle insertion depth: 4.5 cm Catheter type: closed end flexible Catheter size: 19 Gauge Catheter at skin depth: 8.5 cm Test dose: negative and 1.5% lidocaine with Epi 1:200 K  Assessment Sensory level: T10 Events: blood not aspirated, injection not painful, no injection resistance, no paresthesia and negative IV test  Additional Notes 1 attempt Pt. Evaluated and documentation done after procedure finished. Patient identified. Risks/Benefits/Options discussed with patient including but not limited to bleeding, infection, nerve damage, paralysis, failed block, incomplete pain control, headache, blood pressure changes, nausea, vomiting, reactions to medication both or allergic, itching and postpartum back pain. Confirmed with bedside nurse the patient's most recent platelet count. Confirmed with patient that they are not currently taking any anticoagulation, have any bleeding history or any family history of bleeding disorders. Patient expressed understanding and wished to proceed. All questions were answered. Sterile technique was used throughout the entire procedure. Please see nursing notes for vital signs. Test dose was given through epidural catheter and negative prior to continuing to dose epidural or start infusion. Warning signs  of high block given to the patient including shortness of breath, tingling/numbness in hands, complete motor block, or any concerning symptoms with instructions to call for help. Patient was given instructions on fall risk and not to get out of bed. All questions and concerns addressed with instructions to call with any issues or inadequate analgesia.   Patient tolerated the insertion well without immediate complications.Reason for block:procedure for pain

## 2019-12-26 NOTE — Lactation Note (Addendum)
This note was copied from a baby's chart. Lactation Consultation Note  Patient Name: Boy Isela Stantz Today's Date: 12/26/2019 Reason for consult: Initial assessment;Early term 37-38.6wks   Maternal Data Formula Feeding for Exclusion: No Has patient been taught Hand Expression?: No Does the patient have breastfeeding experience prior to this delivery?: Yes Nursed her now 39 yr old x 4 years, she states she has used different pumps and has used the pump that the [facility] office loaned out and it did not work for her   Feeding Feeding Type: Bottle Fed - Formula Nipple Type: Slow - flow Mom states that this baby is father's first child and he wants to feed the baby, she wants to formula feed and breastfeed some, states the baby latches easily and she hears swallows when baby nurses. LATCH Score Latch:  (I did not observe a feeding  )                 Interventions  I encouraged her to offer the breast before offering formula to assist with  Establishing a milk supply and so that baby can learn to breastfeed  Lactation Tools Discussed/Used WIC Program: Yes Given handouts on formula preparation and cronobacter.   Colome name and no written on white board Consult Status Consult Status: PRN    Ferol Luz 12/26/2019, 5:19 PM

## 2019-12-26 NOTE — Plan of Care (Signed)
Patient recovering well postpartum. Fundus is firm U/1 and bleeding is small. Patient assisted to bathroom by RN and attempted to void. Patient was unable to void despite interventions done, but encouraged to drink plenty of fluids and attempt again in the next hour. CNM aware and no new orders given at this time. Plan to transfer to mother baby unit for couplet care.

## 2019-12-26 NOTE — Discharge Instructions (Signed)
Vaginal Delivery, Care After Refer to this sheet in the next few weeks. These discharge instructions provide you with information on caring for yourself after delivery. Your caregiver may also give you specific instructions. Your treatment has been planned according to the most current medical practices available, but problems sometimes occur. Call your caregiver if you have any problems or questions after you go home. HOME CARE INSTRUCTIONS 1. Take over-the-counter or prescription medicines only as directed by your caregiver or pharmacist. 2. Do not drink alcohol, especially if you are breastfeeding or taking medicine to relieve pain. 3. Do not smoke tobacco. 4. Continue to use good perineal care. Good perineal care includes: 1. Wiping your perineum from back to front 2. Keeping your perineum clean. 3. You can do sitz baths twice a day, to help keep this area clean 5. Do not use tampons, douche or have sex until your caregiver says it is okay. 6. Shower only and avoid sitting in submerged water, aside from sitz baths 7. Wear a well-fitting bra that provides breast support. 8. Eat healthy foods. 9. Drink enough fluids to keep your urine clear or pale yellow. 10. Eat high-fiber foods such as whole grain cereals and breads, brown rice, beans, and fresh fruits and vegetables every day. These foods may help prevent or relieve constipation. 11. Avoid constipation with high fiber foods or medications, such as miralax or metamucil 12. Follow your caregiver's recommendations regarding resumption of activities such as climbing stairs, driving, lifting, exercising, or traveling. 50. Talk to your caregiver about resuming sexual activities. Resumption of sexual activities is dependent upon your risk of infection, your rate of healing, and your comfort and desire to resume sexual activity. 14. Try to have someone help you with your household activities and your newborn for at least a few days after you leave  the hospital. 15. Rest as much as possible. Try to rest or take a nap when your newborn is sleeping. 16. Increase your activities gradually. 77. Keep all of your scheduled postpartum appointments. It is very important to keep your scheduled follow-up appointments. At these appointments, your caregiver will be checking to make sure that you are healing physically and emotionally. SEEK MEDICAL CARE IF:   You are passing large clots from your vagina. Save any clots to show your caregiver.  You have a foul smelling discharge from your vagina.  You have trouble urinating.  You are urinating frequently.  You have pain when you urinate.  You have a change in your bowel movements.  You have increasing redness, pain, or swelling near your vaginal incision (episiotomy) or vaginal tear.  You have pus draining from your episiotomy or vaginal tear.  Your episiotomy or vaginal tear is separating.  You have painful, hard, or reddened breasts.  You have a severe headache.  You have blurred vision or see spots.  You feel sad or depressed.  You have thoughts of hurting yourself or your newborn.  You have questions about your care, the care of your newborn, or medicines.  You are dizzy or light-headed.  You have a rash.  You have nausea or vomiting.  You were breastfeeding and have not had a menstrual period within 12 weeks after you stopped breastfeeding.  You are not breastfeeding and have not had a menstrual period by the 12th week after delivery.  You have a fever. SEEK IMMEDIATE MEDICAL CARE IF:   You have persistent pain.  You have chest pain.  You have shortness of breath.  You faint.  You have leg pain.  You have stomach pain.  Your vaginal bleeding saturates two or more sanitary pads in 1 hour. MAKE SURE YOU:   Understand these instructions.  Will watch your condition.  Will get help right away if you are not doing well or get worse. Document Released:  01/28/2000 Document Revised: 06/16/2013 Document Reviewed: 09/27/2011 [facility] Patient Information 2015 [place], [place]. This information is not intended to replace advice given to you by your health care provider. Make sure you discuss any questions you have with your health care provider.  Sitz Bath A sitz bath is a warm water bath taken in the sitting position. The water covers only the hips and butt (buttocks). We recommend using one that fits in the toilet, to help with ease of use and cleanliness. It may be used for either healing or cleaning purposes. Sitz baths are also used to relieve pain, itching, or muscle tightening (spasms). The water may contain medicine. Moist heat will help you heal and relax.  HOME CARE  Take 3 to 4 sitz baths a day. 18. Fill the bathtub half-full with warm water. 19. Sit in the water and open the drain a little. 20. Turn on the warm water to keep the tub half-full. Keep the water running constantly. 21. Soak in the water for 15 to 20 minutes. 22. After the sitz bath, pat the affected area dry. GET HELP RIGHT AWAY IF: You get worse instead of better. Stop the sitz baths if you get worse. MAKE SURE YOU:  Understand these instructions.  Will watch your condition.  Will get help right away if you are not doing well or get worse. Document Released: 03/09/2004 Document Revised: 10/25/2011 Document Reviewed: 05/30/2010 [facility] Patient Information 2015 [place], [place]. This information is not intended to replace advice given to you by your health care provider. Make sure you discuss any questions you have with your health care provider.

## 2019-12-27 LAB — CBC
HCT: 27.4 % — ABNORMAL LOW (ref 36.0–46.0)
Hemoglobin: 9.2 g/dL — ABNORMAL LOW (ref 12.0–15.0)
MCH: 28.6 pg (ref 26.0–34.0)
MCHC: 33.6 g/dL (ref 30.0–36.0)
MCV: 85.1 fL (ref 80.0–100.0)
Platelets: 259 10*3/uL (ref 150–400)
RBC: 3.22 MIL/uL — ABNORMAL LOW (ref 3.87–5.11)
RDW: 13 % (ref 11.5–15.5)
WBC: 8.2 10*3/uL (ref 4.0–10.5)
nRBC: 0 % (ref 0.0–0.2)

## 2019-12-27 MED ORDER — IBUPROFEN 800 MG PO TABS: 800.0000 mg | ORAL_TABLET | Freq: Three times a day (TID) | ORAL | 0 refills | Status: DC | PRN

## 2019-12-27 NOTE — Progress Notes (Signed)
AVS reviewed including instructions, follow up, and prescriptions; pt v/u and denies any questions at this time.  Pt discharged via wheelchair in stable condition with infant in arms.

## 2019-12-27 NOTE — Lactation Note (Signed)
This note was copied from a baby's chart. Lactation Consultation Note  Patient Name: Alexandra Heath UORVI'F Date: 12/27/2019 Reason for consult: Follow-up assessment  Lactation Rounds: LC to the room, Baby is out of the room for the circ. Mother stated she BF well with her last child and has no questions. She stated this baby is feeding well. LC reviewed and encouraged feeding on demand and with cues. If baby is not cueing we encourage hand expression and spoon feed to wake baby. Reviewed diaper counts for days of life and when to call Peds with questions. Reviewed "understanding Postpartum and Newborn care " booklet and Mother stated it was currently located on the bassinet. Reviewed outpatient Lactation number and resources. Mother states she has a pump from [place] that she can place on her and just go. LC encouraged BF prior to bottle and if not BF than to pump.  Parents stated understanding with all teaching and have no further questions at this time.   Maternal Data Formula Feeding for Exclusion: No  Interventions Interventions: Breast feeding basics reviewed  Lactation Tools Discussed/Used   Consult Status Consult Status: PRN   Markisha Meding D Nadiyah Zeis 12/27/2019, 10:40 AM

## 2019-12-27 NOTE — Anesthesia Postprocedure Evaluation (Signed)
Anesthesia Post Note  Patient: Alexandra Heath  Procedure(s) Performed: AN AD HOC LABOR EPIDURAL  Patient location during evaluation: Mother Baby Anesthesia Type: Epidural Level of consciousness: awake and alert Pain management: pain level controlled Vital Signs Assessment: post-procedure vital signs reviewed and stable Respiratory status: spontaneous breathing, nonlabored ventilation and respiratory function stable Cardiovascular status: stable Postop Assessment: no headache, no backache and epidural receding Anesthetic complications: no   No complications documented.   Last Vitals:  Vitals:   12/27/19 0100 12/27/19 0850  BP: 110/65 117/75  Pulse: 80 70  Resp: 20 18  Temp: 36.8 C 36.9 C  SpO2: 99% 100%    Last Pain:  Vitals:   12/27/19 0850  TempSrc: Oral  PainSc:                  Arita Miss

## 2020-01-28 DIAGNOSIS — Z20822 Contact with and (suspected) exposure to covid-19: Secondary | ICD-10-CM | POA: Diagnosis not present

## 2020-02-02 DIAGNOSIS — Z79899 Other long term (current) drug therapy: Secondary | ICD-10-CM | POA: Diagnosis not present

## 2020-02-02 DIAGNOSIS — G43019 Migraine without aura, intractable, without status migrainosus: Secondary | ICD-10-CM | POA: Diagnosis not present

## 2020-02-02 DIAGNOSIS — G40309 Generalized idiopathic epilepsy and epileptic syndromes, not intractable, without status epilepticus: Secondary | ICD-10-CM | POA: Diagnosis not present

## 2020-02-04 DIAGNOSIS — Z20822 Contact with and (suspected) exposure to covid-19: Secondary | ICD-10-CM | POA: Diagnosis not present

## 2020-02-12 DIAGNOSIS — Z20822 Contact with and (suspected) exposure to covid-19: Secondary | ICD-10-CM | POA: Diagnosis not present

## 2020-02-14 NOTE — L&D Delivery Note (Signed)
Delivery Note  First Stage: Labor onset: 0200 Augmentation : Pitocin Analgesia /Anesthesia intrapartum: Epidural AROM at 0631  Second Stage: Complete dilation at 0900 Onset of pushing at 0909 FHR second stage Category II: recurrent variable decels  Delivery of a viable female infant 12/01/2020 at 0914 by Lucrezia Europe, CNM, delivery of fetal head in OA position with restitution to LOA. Loose nuchal cord reduced, left nuchal hand present;  Anterior then posterior shoulders delivered easily with gentle downward traction. Baby placed on mom's chest, and attended to by peds.  Cord double clamped after cessation of pulsation, cut by FOB  Third Stage: Placenta delivered Delena Bali intact with 3 VC @ 0920 Placenta disposition: discarded Uterine tone firm / bleeding scant  no laceration identified  Repair: none Est. Blood Loss (mL): 44RX  Complications: none  Mom to postpartum.  Baby to Couplet care / Skin to Skin.  Newborn: Birth Weight: infant skin-to-skin, will weigh infant after skin-to-skin  Apgar Scores: 8, 9 Feeding planned: bottle feeding

## 2020-02-19 ENCOUNTER — Encounter: Payer: Self-pay | Admitting: Emergency Medicine

## 2020-02-19 ENCOUNTER — Ambulatory Visit
Admission: EM | Admit: 2020-02-19 | Discharge: 2020-02-19 | Disposition: A | Discharge status: Home / Self Care | Payer: Medicaid Other | Attending: Emergency Medicine | Admitting: Emergency Medicine

## 2020-02-19 ENCOUNTER — Other Ambulatory Visit: Payer: Self-pay

## 2020-02-19 DIAGNOSIS — S46811A Strain of other muscles, fascia and tendons at shoulder and upper arm level, right arm, initial encounter: Secondary | ICD-10-CM | POA: Diagnosis not present

## 2020-02-19 MED ORDER — METHOCARBAMOL 500 MG PO TABS: 500.0000 mg | ORAL_TABLET | Freq: Two times a day (BID) | ORAL | 0 refills | Status: DC

## 2020-02-19 MED ORDER — IBUPROFEN 600 MG PO TABS: 600.0000 mg | ORAL_TABLET | Freq: Four times a day (QID) | ORAL | 0 refills | Status: DC | PRN

## 2020-02-19 NOTE — ED Triage Notes (Signed)
Patient c/o right shoulder and upper back pain that started on December 20.  Patient denies any injury or fall. Patient states that she has not been taking any medication OTC for her pain.

## 2020-02-19 NOTE — ED Provider Notes (Signed)
[facility]    CSN: [id number] Arrival date & time: 02/19/20  0800      History   Chief Complaint Chief Complaint  Patient presents with  . Shoulder Pain    right    HPI Alexandra Heath is a 40 y.o. female.   HPI   40 year old female here for evaluation of right upper back and shoulder pain.  Patient reports that her symptoms started on 02/02/2020.  She denies trauma or injury, heavy lifting, previous injury to that shoulder, numbness, tingling, or weakness.  Patient has tried some icy hot that did not relieve her symptoms.  Patient states that the sensation is a tightness and a pulling when she turns her head or uses her right arm.  Patient is 2 months post partum but is not breast-feeding at present.  Past Medical History:  Diagnosis Date  . Epilepsy ([place])   . Fibroids 05/14/2015  . Rapid heart beat   . Scoliosis   . Scoliosis   . Tachycardia     Patient Active Problem List   Diagnosis Date Noted  . Indication for care in labor or delivery 12/26/2019  . AMA (advanced maternal age) multigravida 6+, second trimester 12/26/2019  . Elevated blood-pressure reading without diagnosis of hypertension 12/26/2019  . Encounter for supervision of high risk pregnancy due to advanced maternal age in primigravida 07/03/2019  . 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 Migraines 06/12/2017  . Numbness 04/06/2015  . Palpitations 12/17/2014  . Scoliosis 12/17/2014  . Leg weakness 12/17/2014  . Depression with anxiety 12/17/2014  . Insomnia 12/17/2014  . Polyneuropathy 09/10/2014  . Back muscle spasm 06/15/2014  . Idiopathic scoliosis 06/15/2014    Past Surgical History:  Procedure Laterality Date  . CHOLECYSTECTOMY    . GALLBLADDER SURGERY      OB History    Gravida  6   Para  4   Term  4   Preterm  0   AB  2   Living  4     SAB  1   IAB  1   Ectopic      Multiple  0   Live Births  1             Home Medications    Prior to Admission medications   Medication Sig Start Date End Date Taking? Authorizing Provider  ibuprofen (ADVIL) 600 MG tablet Take 1 tablet (600 mg total) by mouth every 6 (six) hours as needed. 02/19/20  Yes Margarette Canada, NP  lamoTRIgine 100 MG TBDP Take 1 tablet by mouth 2 (two) times daily. 03/16/19  Yes [provider]  methocarbamol (ROBAXIN) 500 MG tablet Take 1 tablet (500 mg total) by mouth 2 (two) times daily. 02/19/20  Yes Margarette Canada, NP  propranolol (INDERAL) 20 MG/5ML solution Take by mouth 3 (three) times daily.   Yes [provider]  betamethasone dipropionate (DIPROLENE) 0.05 % ointment Apply topically 2 (two) times daily. Do not use for more than 3 weeks consecutively as it may thin and lighten skin. 04/01/19   [name], DO  medroxyPROGESTERone (DEPO-PROVERA) 150 MG/ML injection Inject 150 mg into the muscle every 3 (three) months. 01/31/18 04/01/19  [provider]  omeprazole (PRILOSEC) 20 MG capsule Take 20 mg by mouth daily as needed.  04/01/19  [provider]  SUMAtriptan (IMITREX) 50 MG tablet Take 50 mg by mouth every 2 (two) hours as needed for migraine. May repeat in  2 hours if headache persists or recurs.  04/01/19  [provider]    Family History Family History  Problem Relation Age of Onset  . HIV Mother   . Anemia Mother   . Migraines Mother   . Depression Mother   . Hypertension Father   . Diabetes Father   . Hypertension Maternal Grandmother   . Breast cancer Neg Hx     Social History Social History   Tobacco Use  . Smoking status: Never Smoker  . Smokeless tobacco: Never Used  Vaping Use  . Vaping Use: Never used  Substance Use Topics  . Alcohol use: No  . Drug use: No     Allergies   Patient has no known allergies.   Review of Systems Review of Systems  Constitutional: Negative for activity change and appetite change.  Musculoskeletal: Positive for  myalgias. Negative for arthralgias and joint swelling.  Skin: Negative for rash.  Neurological: Negative for tremors, weakness and numbness.  Hematological: Negative.   Psychiatric/Behavioral: Negative.      Physical Exam Triage Vital Signs ED Triage Vitals  Enc Vitals Group     BP      Pulse      Resp      Temp      Temp src      SpO2      Weight      Height      Head Circumference      Peak Flow      Pain Score      Pain Loc      Pain Edu?      Excl. in [place]?    No data found.  Updated Vital Signs BP 115/64 (BP Location: Left Arm)   Pulse 75   Temp 98.2 F (36.8 C) (Oral)   Resp 14   Ht 5\' 6"  (1.676 m)   Wt 139 lb (63 kg)   LMP 01/29/2020 (Exact Date)   SpO2 98%   Breastfeeding No   BMI 22.44 kg/m   Visual Acuity Right Eye Distance:   Left Eye Distance:   Bilateral Distance:    Right Eye Near:   Left Eye Near:    Bilateral Near:     Physical Exam Vitals and nursing note reviewed.  Constitutional:      General: She is not in acute distress.    Appearance: Normal appearance. She is normal weight. She is not toxic-appearing.  HENT:     Head: Normocephalic and atraumatic.  Musculoskeletal:        General: Tenderness present. No swelling, deformity or signs of injury.  Skin:    General: Skin is warm and dry.     Capillary Refill: Capillary refill takes less than 2 seconds.     Findings: No erythema or rash.  Neurological:     General: No focal deficit present.     Mental Status: She is alert and oriented to person, place, and time.  Psychiatric:        Mood and Affect: Mood normal.        Behavior: Behavior normal.        Thought Content: Thought content normal.        Judgment: Judgment normal.      UC Treatments / Results  Labs (all labs ordered are listed, but only abnormal results are displayed) Labs Reviewed - No data to display  EKG   Radiology No results found.  Procedures Procedures (including critical care  time)  Medications Ordered in UC Medications - No data to display  Initial Impression / Assessment and Plan / UC Course  I have reviewed the triage vital signs and the nursing notes.  Pertinent labs & imaging results that were available during my care of the patient were reviewed by me and considered in my medical decision making (see chart for details).   Patient is here for evaluation of right shoulder pain.  Clinical impression:  Patient has tenderness and spasm to the superior two thirds of her right trapezius muscle.  The tenderness extends into the right paraspinous region and down to the bottom of her right scapula.  There is no scapular tenderness no glenohumeral joint tenderness or deformity.  Patient has no tenderness to palpation of the deltoid muscle, bicep, tricep, or pectoralis.  Patient does have pain with pulling and pushing motion.  No numbness tingling or weakness.  Radial and ulnar pulses are 2+.  Patient has full sensation range of motion of her right hand, right elbow, and right shoulder.  Patient's grip is 5/5.  Patient's symptoms are consistent with a thoracic strain.  Will treat with ibuprofen, methocarbamol, moist heat, and we discussed the use of a TENS unit.   Final Clinical Impressions(s) / UC Diagnoses   Final diagnoses:  Trapezius strain, right, initial encounter     Discharge Instructions     Take ibuprofen 600 mg every 6 hours with food for the next 2 days and then you can go to an as-needed basis.  Take the methocarbamol 1000 mg every 6 hours with food for the next 2 days and then you can go to an as-needed basis.  Follow the shoulder range of motion exercises given in your discharge paperwork.  Utilize moist heat for 20 minutes at a time 2-3 times a day to improve blood flow to the muscle and aid in reducing the spasm.  Massage therapy can also be very beneficial to help improve blood flow to the muscle and reduce spasm.  You can obtain the TENS  unit from a medical supply store or from [employer].  Do not use it for more than an hour at a time.  Keep your follow-up appointment with your primary care doctor as scheduled.    ED Prescriptions    Medication Sig Dispense Auth. Provider   ibuprofen (ADVIL) 600 MG tablet Take 1 tablet (600 mg total) by mouth every 6 (six) hours as needed. 30 tablet Becky Augusta, NP   methocarbamol (ROBAXIN) 500 MG tablet Take 1 tablet (500 mg total) by mouth 2 (two) times daily. 20 tablet Becky Augusta, NP     PDMP not reviewed this encounter.   Becky Augusta, NP 02/19/20 [phone]

## 2020-02-19 NOTE — Discharge Instructions (Addendum)
Take ibuprofen 600 mg every 6 hours with food for the next 2 days and then you can go to an as-needed basis.  Take the methocarbamol 1000 mg every 6 hours with food for the next 2 days and then you can go to an as-needed basis.  Follow the shoulder range of motion exercises given in your discharge paperwork.  Utilize moist heat for 20 minutes at a time 2-3 times a day to improve blood flow to the muscle and aid in reducing the spasm.  Massage therapy can also be very beneficial to help improve blood flow to the muscle and reduce spasm.  You can obtain the TENS unit from a medical supply store or from [employer].  Do not use it for more than an hour at a time.  Keep your follow-up appointment with your primary care doctor as scheduled.

## 2020-02-24 DIAGNOSIS — G40309 Generalized idiopathic epilepsy and epileptic syndromes, not intractable, without status epilepticus: Secondary | ICD-10-CM | POA: Diagnosis not present

## 2020-02-24 DIAGNOSIS — M25511 Pain in right shoulder: Secondary | ICD-10-CM | POA: Diagnosis not present

## 2020-02-24 DIAGNOSIS — R002 Palpitations: Secondary | ICD-10-CM | POA: Diagnosis not present

## 2020-02-26 DIAGNOSIS — Z30013 Encounter for initial prescription of injectable contraceptive: Secondary | ICD-10-CM | POA: Diagnosis not present

## 2020-04-15 ENCOUNTER — Other Ambulatory Visit: Payer: Self-pay

## 2020-04-15 ENCOUNTER — Ambulatory Visit (LOCAL_COMMUNITY_HEALTH_CENTER): Payer: Medicaid Other

## 2020-04-15 VITALS — BP 128/72 | Ht 66.0 in | Wt 143.8 lb

## 2020-04-15 DIAGNOSIS — Z3201 Encounter for pregnancy test, result positive: Secondary | ICD-10-CM | POA: Diagnosis not present

## 2020-04-15 LAB — PREGNANCY, URINE: Preg Test, Ur: POSITIVE — AB

## 2020-04-15 MED ORDER — PRENATAL 27-0.8 MG PO TABS: 1.0000 | ORAL_TABLET | Freq: Every day | ORAL | 0 refills | Status: AC

## 2020-04-15 NOTE — Progress Notes (Signed)
UPT positive. Plans prenatal care at [facility], has appt 05/17/2020. Pt reports taking propranolol and lamotrigine. Pt counseled to notify prescribing providers of preg status and ask about guidance regarding meds. Pt in agreement and plans to call them. Pt plans to contact WIC/Medicaid for PW this week. No [facility] on file. Josie Saunders, RN

## 2020-04-22 DIAGNOSIS — G40309 Generalized idiopathic epilepsy and epileptic syndromes, not intractable, without status epilepticus: Secondary | ICD-10-CM | POA: Diagnosis not present

## 2020-05-17 DIAGNOSIS — N912 Amenorrhea, unspecified: Secondary | ICD-10-CM | POA: Diagnosis not present

## 2020-05-17 DIAGNOSIS — G40309 Generalized idiopathic epilepsy and epileptic syndromes, not intractable, without status epilepticus: Secondary | ICD-10-CM | POA: Diagnosis not present

## 2020-06-01 DIAGNOSIS — Z3481 Encounter for supervision of other normal pregnancy, first trimester: Secondary | ICD-10-CM | POA: Diagnosis not present

## 2020-06-01 LAB — OB RESULTS CONSOLE HEPATITIS B SURFACE ANTIGEN: Hepatitis B Surface Ag: NEGATIVE

## 2020-06-01 LAB — OB RESULTS CONSOLE VARICELLA ZOSTER ANTIBODY, IGG: Varicella: IMMUNE

## 2020-06-01 LAB — OB RESULTS CONSOLE RUBELLA ANTIBODY, IGM: Rubella: IMMUNE

## 2020-07-24 ENCOUNTER — Encounter: Payer: Self-pay | Admitting: Emergency Medicine

## 2020-07-24 ENCOUNTER — Ambulatory Visit
Admission: EM | Admit: 2020-07-24 | Discharge: 2020-07-24 | Disposition: A | Discharge status: Home / Self Care | Payer: Medicaid Other | Attending: Physician Assistant | Admitting: Physician Assistant

## 2020-07-24 ENCOUNTER — Other Ambulatory Visit: Payer: Self-pay

## 2020-07-24 DIAGNOSIS — Z3A20 20 weeks gestation of pregnancy: Secondary | ICD-10-CM | POA: Diagnosis present

## 2020-07-24 DIAGNOSIS — O99612 Diseases of the digestive system complicating pregnancy, second trimester: Secondary | ICD-10-CM

## 2020-07-24 DIAGNOSIS — R1032 Left lower quadrant pain: Secondary | ICD-10-CM | POA: Diagnosis not present

## 2020-07-24 DIAGNOSIS — K5900 Constipation, unspecified: Secondary | ICD-10-CM | POA: Diagnosis present

## 2020-07-24 LAB — POCT URINALYSIS DIP (DEVICE)
Bilirubin Urine: NEGATIVE
Glucose, UA: NEGATIVE mg/dL
Hgb urine dipstick: NEGATIVE
Ketones, ur: NEGATIVE mg/dL
Leukocytes,Ua: NEGATIVE
Nitrite: NEGATIVE
Protein, ur: NEGATIVE mg/dL
Specific Gravity, Urine: 1.025 (ref 1.005–1.030)
Urobilinogen, UA: 1 mg/dL (ref 0.0–1.0)
pH: 7 (ref 5.0–8.0)

## 2020-07-24 NOTE — ED Triage Notes (Signed)
Pt c/o LLQ pain. Started last night. She state she is [redacted] weeks pregnant. She states she has felt like this with UTIs and constipation. She states she has had urinary frequency. Last BM last yesterday. Denies vaginal discharge or bleeding. She states the pain is constant, and it hurts when walking. Denies nausea, vomiting or diarrhea.

## 2020-07-24 NOTE — Discharge Instructions (Addendum)
Try the fleets enema like your OB told you on your prior pregnancy and if you dont get relief, please go to the ER

## 2020-07-24 NOTE — ED Provider Notes (Signed)
[facility]    CSN: [id number] Arrival date & time: 07/24/20  1122      History   Chief Complaint Chief Complaint  Patient presents with   Abdominal Pain    LLQ- [redacted] weeks pregnant    HPI Taviana GENESSIS FLANARY is a 40 y.o. female who is [redacted] weeks pregnant and has been having LLQ pain. She has had similar symptoms when she had constipation and UTI in the past. Denies abnormal vaginal discharge or bleeding. Last BM was yesterday.  She describes as the pain being constant and provoked when walking. Denies N/V/D. The stool she passed yesterday were hard pellets. She did this with her prior pregnancy and was told to take Miralax and do fleets enema which helped. But has not tried this yet.      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[place]) 09/18/2018   Migraines 06/12/2017   Numbness 04/06/2015   Palpitations 12/17/2014   Scoliosis 12/17/2014   Leg weakness 12/17/2014   Depression with anxiety 12/17/2014   Insomnia 12/17/2014   Polyneuropathy 09/10/2014   Back muscle spasm 06/15/2014   Idiopathic scoliosis 06/15/2014    Past Surgical History:  Procedure Laterality Date   CHOLECYSTECTOMY     GALLBLADDER SURGERY      OB History     Gravida  7   Para  4   Term  4   Preterm  0   AB  2   Living  4      SAB  1   IAB  1   Ectopic      Multiple  0   Live Births  4            Home Medications    Prior to  Admission medications   Medication Sig Start Date End Date Taking? Authorizing Provider  lamoTRIgine 100 MG TBDP Take 1 tablet by mouth 2 (two) times daily. 03/16/19  Yes [provider]  Prenatal Vit-Fe Fumarate-FA (MULTIVITAMIN-PRENATAL) 27-0.8 MG TABS tablet Take 1 tablet by mouth daily at 12 noon. 04/15/20 07/24/20 Yes Caren Macadam, MD  propranolol (INDERAL) 20 MG/5ML solution Take by mouth 3 (three) times daily.   Yes [provider]  medroxyPROGESTERone (DEPO-PROVERA) 150 MG/ML injection Inject 150 mg into the muscle every 3 (three) months. 01/31/18 04/01/19  [provider]  omeprazole (PRILOSEC) 20 MG capsule Take 20 mg by mouth daily as needed.  04/01/19  [provider]  SUMAtriptan (IMITREX) 50 MG tablet Take 50 mg by mouth every 2 (two) hours as needed for migraine. May repeat in 2 hours if headache persists or recurs.  04/01/19  [provider]    Family History Family History  Problem Relation Age of Onset   HIV Mother    Anemia Mother    Migraines Mother    Depression Mother    Hypertension Father    Diabetes Father    Hypertension Maternal Grandmother  Breast cancer Neg Hx     Social History Social History   Tobacco Use   Smoking status: Never   Smokeless tobacco: Never  Vaping Use   Vaping Use: Never used  Substance Use Topics   Alcohol use: No   Drug use: No     Allergies   Patient has no known allergies.   Review of Systems Review of Systems  Constitutional:  Negative for chills, diaphoresis and fever.  Gastrointestinal:  Positive for abdominal pain. Negative for diarrhea, nausea and vomiting.  Genitourinary:  Positive for frequency. Negative for difficulty urinating, dysuria, urgency, vaginal bleeding, vaginal discharge and vaginal pain.  Musculoskeletal:  Negative for gait problem.  Skin:  Negative for rash.    Physical Exam Triage Vital Signs ED Triage Vitals [07/24/20 1145]  Enc Vitals Group      BP 124/76     Pulse Rate 79     Resp 18     Temp 98.1 F (36.7 C)     Temp Source Oral     SpO2 100 %     Weight      Height      Head Circumference      Peak Flow      Pain Score 9     Pain Loc      Pain Edu?      Excl. in [place]?    No data found.  Updated Vital Signs BP 124/76 (BP Location: Right Arm)   Pulse 79   Temp 98.1 F (36.7 C) (Oral)   Resp 18   LMP 03/22/2020 (Exact Date)   SpO2 100%   Visual Acuity Right Eye Distance:   Left Eye Distance:   Bilateral Distance:    Right Eye Near:   Left Eye Near:    Bilateral Near:     Physical Exam Vitals reviewed.  Constitutional:      General: She is not in acute distress.    Appearance: She is not toxic-appearing.  HENT:     Head: Normocephalic.  Pulmonary:     Effort: Pulmonary effort is normal.  Abdominal:     General: Bowel sounds are normal.     Palpations: Abdomen is soft.     Tenderness: There is abdominal tenderness in the left lower quadrant. There is no guarding or rebound.     Hernia: No hernia is present.     Comments: Fundus is up to umbilicus  Skin:    General: Skin is warm and dry.     Findings: No rash.  Neurological:     Mental Status: She is alert.   UC Treatments / Results  Labs (all labs ordered are listed, but only abnormal results are displayed) Labs Reviewed  POCT URINALYSIS DIPSTICK, ED / UC  POCT URINALYSIS DIP (DEVICE)   UA dip is neg.  EKG   Radiology No results found.  Procedures Procedures (including critical care time)  Medications Ordered in UC Medications - No data to display  Initial Impression / Assessment and Plan / UC Course  I have reviewed the triage vital signs and the nursing notes. Pertinent labs  results that were available during my care of the patient were reviewed by me and considered in my medical decision making (see chart for details). LLQ pain which could be constipation, so she will try the fleets enema and if no relief needs to go to ER.  Pt agrees.  Final Clinical Impressions(s) / UC Diagnoses   Final diagnoses:  Abdominal  pain, left lower quadrant  [redacted] weeks gestation of pregnancy  Constipation, unspecified constipation type     Discharge Instructions      Try the fleets enema like your OB told you on your prior pregnancy and if you dont get relief, please go to the ER     ED Prescriptions   None    PDMP not reviewed this encounter.   Shelby Mattocks, Hershal Coria 07/24/20 1237

## 2020-07-29 DIAGNOSIS — Z3481 Encounter for supervision of other normal pregnancy, first trimester: Secondary | ICD-10-CM | POA: Diagnosis not present

## 2020-08-11 DIAGNOSIS — G40309 Generalized idiopathic epilepsy and epileptic syndromes, not intractable, without status epilepticus: Secondary | ICD-10-CM | POA: Diagnosis not present

## 2020-08-18 DIAGNOSIS — G40309 Generalized idiopathic epilepsy and epileptic syndromes, not intractable, without status epilepticus: Secondary | ICD-10-CM | POA: Diagnosis not present

## 2020-08-18 DIAGNOSIS — Z3490 Encounter for supervision of normal pregnancy, unspecified, unspecified trimester: Secondary | ICD-10-CM | POA: Diagnosis not present

## 2020-09-23 DIAGNOSIS — O09522 Supervision of elderly multigravida, second trimester: Secondary | ICD-10-CM | POA: Diagnosis not present

## 2020-09-23 DIAGNOSIS — N898 Other specified noninflammatory disorders of vagina: Secondary | ICD-10-CM | POA: Diagnosis not present

## 2020-09-28 DIAGNOSIS — R7309 Other abnormal glucose: Secondary | ICD-10-CM | POA: Diagnosis not present

## 2020-10-11 DIAGNOSIS — Z23 Encounter for immunization: Secondary | ICD-10-CM | POA: Diagnosis not present

## 2020-10-26 DIAGNOSIS — O99352 Diseases of the nervous system complicating pregnancy, second trimester: Secondary | ICD-10-CM | POA: Diagnosis not present

## 2020-10-26 DIAGNOSIS — O09522 Supervision of elderly multigravida, second trimester: Secondary | ICD-10-CM | POA: Diagnosis not present

## 2020-11-08 DIAGNOSIS — L299 Pruritus, unspecified: Secondary | ICD-10-CM | POA: Diagnosis not present

## 2020-11-08 DIAGNOSIS — O99713 Diseases of the skin and subcutaneous tissue complicating pregnancy, third trimester: Secondary | ICD-10-CM | POA: Diagnosis not present

## 2020-11-08 DIAGNOSIS — O09523 Supervision of elderly multigravida, third trimester: Secondary | ICD-10-CM | POA: Diagnosis not present

## 2020-11-09 ENCOUNTER — Other Ambulatory Visit: Payer: Self-pay | Admitting: Obstetrics and Gynecology

## 2020-11-09 NOTE — Progress Notes (Signed)
Alexandra Heath is a 40 y.o. [id number] female at [redacted]w[redacted]d dated by LMP.  She presents to L&D for induction of labor for cholestasis  Pregnancy Issues: 1. Desires BTL, sign consents at anatomy US visit [x]  2. Anemia - 9.4 at 107w5d - encouraged to start Fe supplementation 3. Advanced maternal age, 40yo at delivery] 4. Epilepsy 5. Close interval pregnancies  6. Poor weight gain in pregnancy    Prenatal care site: [facility]  EFW: 10/26/20: 1955 G @ 45%   Pertinent Results:  Prenatal Labs: Blood type/Rh A pos  Antibody screen neg  Rubella Immune  Varicella Immune  RPR NR  HBsAg Neg  HIV NR  GC neg  Chlamydia neg  Genetic screening declined  1 hour GTT 149  3 hour GTT 86, 163, 116, 90  GBS pending    5. Post Partum Planning: - Infant feeding: bottle feeding - Contraception: desires BTL - Consent signed 07/29/2020 - Tdap given 10/11/20 - Flu declined in PP setting  [place], CNM 11/09/2020 7:14 PM

## 2020-11-13 ENCOUNTER — Observation Stay
Admission: EM | Admit: 2020-11-13 | Discharge: 2020-11-13 | Disposition: A | Discharge status: Home / Self Care | Payer: Medicaid Other | Attending: Obstetrics and Gynecology | Admitting: Obstetrics and Gynecology

## 2020-11-13 ENCOUNTER — Other Ambulatory Visit: Payer: Self-pay

## 2020-11-13 DIAGNOSIS — O218 Other vomiting complicating pregnancy: Secondary | ICD-10-CM | POA: Diagnosis not present

## 2020-11-13 DIAGNOSIS — O0993 Supervision of high risk pregnancy, unspecified, third trimester: Secondary | ICD-10-CM | POA: Insufficient documentation

## 2020-11-13 DIAGNOSIS — Z3A35 35 weeks gestation of pregnancy: Secondary | ICD-10-CM | POA: Insufficient documentation

## 2020-11-13 DIAGNOSIS — O99013 Anemia complicating pregnancy, third trimester: Secondary | ICD-10-CM | POA: Insufficient documentation

## 2020-11-13 DIAGNOSIS — O99891 Other specified diseases and conditions complicating pregnancy: Secondary | ICD-10-CM | POA: Insufficient documentation

## 2020-11-13 DIAGNOSIS — G40909 Epilepsy, unspecified, not intractable, without status epilepticus: Secondary | ICD-10-CM | POA: Insufficient documentation

## 2020-11-13 DIAGNOSIS — O219 Vomiting of pregnancy, unspecified: Secondary | ICD-10-CM | POA: Diagnosis present

## 2020-11-13 DIAGNOSIS — Z3490 Encounter for supervision of normal pregnancy, unspecified, unspecified trimester: Secondary | ICD-10-CM

## 2020-11-13 DIAGNOSIS — O09513 Supervision of elderly primigravida, third trimester: Secondary | ICD-10-CM | POA: Diagnosis not present

## 2020-11-13 DIAGNOSIS — O09523 Supervision of elderly multigravida, third trimester: Secondary | ICD-10-CM | POA: Diagnosis not present

## 2020-11-13 DIAGNOSIS — K831 Obstruction of bile duct: Secondary | ICD-10-CM | POA: Diagnosis not present

## 2020-11-13 MED ORDER — ONDANSETRON HCL 4 MG/2ML IJ SOLN
INTRAMUSCULAR | Status: AC
  Filled 2020-11-13: qty 2

## 2020-11-13 MED ORDER — ONDANSETRON HCL 4 MG/2ML IJ SOLN
4.0000 mg | Freq: Once | INTRAMUSCULAR | Status: AC
  Administered 2020-11-13: 4 mg via INTRAVENOUS

## 2020-11-13 MED ORDER — LACTATED RINGERS IV BOLUS
500.0000 mL | Freq: Once | INTRAVENOUS | Status: AC
  Administered 2020-11-13: 500 mL via INTRAVENOUS

## 2020-11-13 NOTE — Progress Notes (Signed)
Discharge instructions reviewed and pt verbalized understanding. Red flag labor precautions reviewed. Pt stable at the time of discharge with significant other. Pt discharged by CNM.

## 2020-11-13 NOTE — Discharge Instructions (Signed)
LABOR: When contractions begin, you should start to time them from the beginning of one contraction to the beginning of the next.  When contractions are 5-10 minutes apart or less and have been regular for at least an hour, you should call your health care provider.  Notify your doctor if any of the following occur: 1. Bleeding from the vagina 7. Sudden, constant, or occasional abdominal pain  2. Pain or burning when urinating 8. Sudden gushing of fluid from the vagina (with or without continued leaking)  3. Chills or fever 9. Fainting spells, "black outs" or loss of consciousness  4. Increase in vaginal discharge 10. Severe or continued nausea or vomiting  5. Pelvic pressure (sudden increase) 11. Blurring of vision or spots before the eyes  6. Baby moving less than usual 12. Leaking of fluid    FETAL KICK COUNT: Lie on your left side for one hour after a meal, and count the number of times your baby kicks. If it is less than 5 times, get up, move around and drink some juice. Repeat the test 30 minutes later. If it is still less than 5 kicks in an hour, notify your doctor.

## 2020-11-13 NOTE — Discharge Summary (Signed)
Alexandra Heath is a 40 y.o. female. She is at [redacted]w[redacted]d gestation. Patient's last menstrual period was 03/22/2020 (exact date). Estimated Date of Delivery: 12/15/20  Prenatal care site: [facility]   Current pregnancy complicated by:  1. Desires BTL, sign consents at anatomy US visit [x]  2. Anemia - 9.4 at [redacted]w[redacted]d - encouraged to start Fe supplementation 3. Advanced maternal age, 40yo at delivery] 4. Epilepsy 5. Close interval pregnancies  6. Poor weight gain in pregnancy   Chief complaint: Irreg UCs since midnight, denies LOF, VB. Active FM noted. Pt also reports nausea with vomiting this evening.   S: Resting comfortably. no VB.no LOF,  Active fetal movement. Denies: HA, visual changes, SOB, or RUQ/epigastric pain  Maternal Medical History:   Past Medical History:  Diagnosis Date   Epilepsy ([place])    Fibroids 05/14/2015   Rapid heart beat    Scoliosis    Scoliosis    Tachycardia     Past Surgical History:  Procedure Laterality Date   CHOLECYSTECTOMY     GALLBLADDER SURGERY      No Known Allergies  Prior to Admission medications   Medication Sig Start Date End Date Taking? Authorizing Provider  ferrous sulfate 325 (65 FE) MG tablet Take 325 mg by mouth daily with breakfast.   Yes [provider]  lamoTRIgine 100 MG TBDP Take 1 tablet by mouth 2 (two) times daily. 03/16/19  Yes [provider]  propranolol (INDERAL) 20 MG/5ML solution Take by mouth 3 (three) times daily.   Yes [provider]  medroxyPROGESTERone (DEPO-PROVERA) 150 MG/ML injection Inject 150 mg into the muscle every 3 (three) months. 01/31/18 04/01/19  [provider]  omeprazole (PRILOSEC) 20 MG capsule Take 20 mg by mouth daily as needed.  04/01/19  [provider]  SUMAtriptan (IMITREX) 50 MG tablet Take 50 mg by mouth every 2 (two) hours as needed for migraine. May repeat in 2 hours if headache persists or recurs.  04/01/19  [provider]       Social History: She  reports that she has never smoked. She has never used smokeless tobacco. She reports that she does not drink alcohol and does not use drugs.  Family History: family history includes Anemia in her mother; Depression in her mother; Diabetes in her father; HIV in her mother; Hypertension in her father and maternal grandmother; Migraines in her mother.   Review of Systems: A full review of systems was performed and negative except as noted in the HPI.     O:  BP 131/75 (BP Location: Right Arm)   Pulse 79   Temp 98.4 F (36.9 C)   Resp 18   Ht 5\' 6"  (1.676 m)   Wt 62.6 kg   LMP 03/22/2020 (Exact Date)   BMI 22.27 kg/m  No results found for this or any previous visit (from the past 63 hour(s)).   Constitutional: NAD, AAOx3  HE/ENT: extraocular movements grossly intact, moist mucous membranes CV: RRR PULM: nl respiratory effort, CTABL     Abd: gravid, non-tender, non-distended, soft      Ext: Non-tender, Nonedematous   Psych: mood appropriate, speech normal Pelvic: SVE 1-2/50/-2, soft, midposition  Fetal  monitoring: Cat I Appropriate for GA Baseline: 125bpm Variability: moderate Accelerations: present x >2 Decelerations absent  Toco: UI with occasional UC   A/P: 40 y.o. [redacted]w[redacted]d here for antenatal surveillance for preterm contractions.   Principle Diagnosis:  High risk pregnancy in third trimester, preterm contractions, 35wks  Preterm labor: not present, no evidence of active labor, irreg UCs, and pain significantly less after IV hydration and antiemetic.   Planned delivery at 37wks due to cholestasis.  Fetal Wellbeing: Reassuring Cat 1 tracing. Reactive NST  D/c home stable, precautions reviewed, follow-up as scheduled.    Francetta Found, CNM 11/13/2020  3:19 AM

## 2020-11-15 ENCOUNTER — Telehealth: Payer: Self-pay

## 2020-11-15 NOTE — Telephone Encounter (Signed)
Transition Care Management Unsuccessful Follow-up Telephone Call  Date of discharge and from where:  11/13/2020-[facility]  Attempts:  1st Attempt  Reason for unsuccessful TCM follow-up call:  Left voice message

## 2020-11-16 DIAGNOSIS — O09523 Supervision of elderly multigravida, third trimester: Secondary | ICD-10-CM | POA: Diagnosis not present

## 2020-11-16 DIAGNOSIS — O26613 Liver and biliary tract disorders in pregnancy, third trimester: Secondary | ICD-10-CM | POA: Diagnosis not present

## 2020-11-16 DIAGNOSIS — K831 Obstruction of bile duct: Secondary | ICD-10-CM | POA: Diagnosis not present

## 2020-11-16 NOTE — Telephone Encounter (Signed)
Transition Care Management Unsuccessful Follow-up Telephone Call  Date of discharge and from where:  11/13/2020 from [facility]  Attempts:  2nd Attempt  Reason for unsuccessful TCM follow-up call:  Left voice message

## 2020-11-17 NOTE — Telephone Encounter (Signed)
Transition Care Management Follow-up Telephone Call Date of discharge and from where: 11/13/2020 from [facility] How have you been since you were released from the hospital? Pt stated that she is still feeling rough. Pt is scheduled to be induced next week.  Any questions or concerns? No  Items Reviewed: Did the pt receive and understand the discharge instructions provided? Yes  Medications obtained and verified? Yes  Other? No  Any new allergies since your discharge? No  Dietary orders reviewed? No Do you have support at home? Yes   Functional Questionnaire: (I = Independent and D = Dependent) ADLs: I  Bathing/Dressing- I  Meal Prep- I  Eating- I  Maintaining continence- I  Transferring/Ambulation- I  Managing Meds- I   Follow up appointments reviewed:  PCP Hospital f/u appt confirmed? No   Specialist Hospital f/u appt confirmed? No  Are transportation arrangements needed? No  If their condition worsens, is the pt aware to call PCP or go to the Emergency Dept.? Yes Was the patient provided with contact information for the PCP's office or ED? Yes Was to pt encouraged to call back with questions or concerns? Yes

## 2020-11-23 DIAGNOSIS — G40909 Epilepsy, unspecified, not intractable, without status epilepticus: Secondary | ICD-10-CM | POA: Diagnosis not present

## 2020-11-23 DIAGNOSIS — O09523 Supervision of elderly multigravida, third trimester: Secondary | ICD-10-CM | POA: Diagnosis not present

## 2020-11-23 DIAGNOSIS — K831 Obstruction of bile duct: Secondary | ICD-10-CM | POA: Diagnosis not present

## 2020-11-23 DIAGNOSIS — O26613 Liver and biliary tract disorders in pregnancy, third trimester: Secondary | ICD-10-CM | POA: Diagnosis not present

## 2020-11-23 DIAGNOSIS — O99353 Diseases of the nervous system complicating pregnancy, third trimester: Secondary | ICD-10-CM | POA: Diagnosis not present

## 2020-11-23 LAB — OB RESULTS CONSOLE RPR: RPR: NONREACTIVE

## 2020-11-23 LAB — OB RESULTS CONSOLE GC/CHLAMYDIA
Chlamydia: NEGATIVE
Gonorrhea: NEGATIVE

## 2020-11-23 LAB — OB RESULTS CONSOLE HIV ANTIBODY (ROUTINE TESTING): HIV: NONREACTIVE

## 2020-11-23 LAB — OB RESULTS CONSOLE GBS: GBS: POSITIVE

## 2020-11-26 ENCOUNTER — Other Ambulatory Visit
Admission: RE | Admit: 2020-11-26 | Discharge: 2020-11-26 | Disposition: A | Discharge status: Home / Self Care | Payer: Medicaid Other | Source: Ambulatory Visit | Attending: Obstetrics and Gynecology | Admitting: Obstetrics and Gynecology

## 2020-11-26 ENCOUNTER — Other Ambulatory Visit: Payer: Self-pay

## 2020-11-26 DIAGNOSIS — K831 Obstruction of bile duct: Secondary | ICD-10-CM | POA: Diagnosis not present

## 2020-11-26 DIAGNOSIS — Z01812 Encounter for preprocedural laboratory examination: Secondary | ICD-10-CM | POA: Diagnosis not present

## 2020-11-26 DIAGNOSIS — Z20822 Contact with and (suspected) exposure to covid-19: Secondary | ICD-10-CM | POA: Diagnosis not present

## 2020-11-26 DIAGNOSIS — O26613 Liver and biliary tract disorders in pregnancy, third trimester: Secondary | ICD-10-CM | POA: Diagnosis not present

## 2020-11-26 DIAGNOSIS — O09523 Supervision of elderly multigravida, third trimester: Secondary | ICD-10-CM | POA: Diagnosis not present

## 2020-11-26 LAB — SARS CORONAVIRUS 2 (TAT 6-24 HRS): SARS Coronavirus 2: NEGATIVE

## 2020-12-01 ENCOUNTER — Encounter: Payer: Self-pay | Admitting: Obstetrics and Gynecology

## 2020-12-01 ENCOUNTER — Inpatient Hospital Stay: Payer: Medicaid Other | Admitting: Anesthesiology

## 2020-12-01 ENCOUNTER — Inpatient Hospital Stay
Admission: EM | Admit: 2020-12-01 | Discharge: 2020-12-03 | DRG: 796 | Disposition: A | Discharge status: Home / Self Care | Payer: Medicaid Other | Attending: Obstetrics and Gynecology | Admitting: Obstetrics and Gynecology

## 2020-12-01 ENCOUNTER — Other Ambulatory Visit: Payer: Self-pay

## 2020-12-01 DIAGNOSIS — O2662 Liver and biliary tract disorders in childbirth: Secondary | ICD-10-CM | POA: Diagnosis not present

## 2020-12-01 DIAGNOSIS — O26619 Liver and biliary tract disorders in pregnancy, unspecified trimester: Secondary | ICD-10-CM | POA: Diagnosis present

## 2020-12-01 DIAGNOSIS — G40909 Epilepsy, unspecified, not intractable, without status epilepticus: Secondary | ICD-10-CM | POA: Diagnosis present

## 2020-12-01 DIAGNOSIS — Z20822 Contact with and (suspected) exposure to covid-19: Secondary | ICD-10-CM | POA: Diagnosis not present

## 2020-12-01 DIAGNOSIS — Z9851 Tubal ligation status: Secondary | ICD-10-CM

## 2020-12-01 DIAGNOSIS — O26649 Intrahepatic cholestasis of pregnancy, unspecified trimester: Secondary | ICD-10-CM | POA: Diagnosis present

## 2020-12-01 DIAGNOSIS — O9902 Anemia complicating childbirth: Secondary | ICD-10-CM | POA: Diagnosis present

## 2020-12-01 DIAGNOSIS — D509 Iron deficiency anemia, unspecified: Secondary | ICD-10-CM | POA: Diagnosis present

## 2020-12-01 DIAGNOSIS — Z302 Encounter for sterilization: Secondary | ICD-10-CM

## 2020-12-01 DIAGNOSIS — O99354 Diseases of the nervous system complicating childbirth: Secondary | ICD-10-CM | POA: Diagnosis present

## 2020-12-01 DIAGNOSIS — K831 Obstruction of bile duct: Secondary | ICD-10-CM | POA: Diagnosis not present

## 2020-12-01 DIAGNOSIS — F418 Other specified anxiety disorders: Secondary | ICD-10-CM | POA: Diagnosis not present

## 2020-12-01 DIAGNOSIS — Z3A38 38 weeks gestation of pregnancy: Secondary | ICD-10-CM

## 2020-12-01 DIAGNOSIS — Z3A37 37 weeks gestation of pregnancy: Secondary | ICD-10-CM | POA: Diagnosis not present

## 2020-12-01 LAB — CBC
HCT: 26.5 % — ABNORMAL LOW (ref 36.0–46.0)
Hemoglobin: 8.7 g/dL — ABNORMAL LOW (ref 12.0–15.0)
MCH: 27.2 pg (ref 26.0–34.0)
MCHC: 32.8 g/dL (ref 30.0–36.0)
MCV: 82.8 fL (ref 80.0–100.0)
Platelets: 257 10*3/uL (ref 150–400)
RBC: 3.2 MIL/uL — ABNORMAL LOW (ref 3.87–5.11)
RDW: 14.5 % (ref 11.5–15.5)
WBC: 6.2 10*3/uL (ref 4.0–10.5)
nRBC: 0 % (ref 0.0–0.2)

## 2020-12-01 LAB — COMPREHENSIVE METABOLIC PANEL
ALT: 29 U/L (ref 0–44)
AST: 28 U/L (ref 15–41)
Albumin: 2.6 g/dL — ABNORMAL LOW (ref 3.5–5.0)
Alkaline Phosphatase: 164 U/L — ABNORMAL HIGH (ref 38–126)
Anion gap: 5 (ref 5–15)
BUN: 17 mg/dL (ref 6–20)
CO2: 24 mmol/L (ref 22–32)
Calcium: 8.8 mg/dL — ABNORMAL LOW (ref 8.9–10.3)
Chloride: 105 mmol/L (ref 98–111)
Creatinine, Ser: 0.94 mg/dL (ref 0.44–1.00)
GFR, Estimated: >60 mL/min (ref >60)
Glucose, Bld: 84 mg/dL (ref 70–99)
Potassium: 4.3 mmol/L (ref 3.5–5.1)
Sodium: 134 mmol/L — ABNORMAL LOW (ref 135–145)
Total Bilirubin: 0.5 mg/dL (ref 0.3–1.2)
Total Protein: 6.7 g/dL (ref 6.5–8.1)

## 2020-12-01 LAB — TYPE AND SCREEN
ABO/RH(D): A POS
Antibody Screen: NEGATIVE

## 2020-12-01 LAB — RPR: RPR Ser Ql: NONREACTIVE

## 2020-12-01 MED ORDER — OXYTOCIN BOLUS FROM INFUSION
333.0000 mL | Freq: Once | INTRAVENOUS | Status: AC
  Administered 2020-12-01: 333 mL via INTRAVENOUS

## 2020-12-01 MED ORDER — PENICILLIN G POT IN DEXTROSE 60000 UNIT/ML IV SOLN
3.0000 10*6.[IU] | INTRAVENOUS | Status: DC
  Administered 2020-12-01: 3 10*6.[IU] via INTRAVENOUS
  Filled 2020-12-01: qty 50

## 2020-12-01 MED ORDER — OXYTOCIN-SODIUM CHLORIDE 30-0.9 UT/500ML-% IV SOLN
1.0000 m[IU]/min | INTRAVENOUS | Status: DC
  Administered 2020-12-01: 2 m[IU]/min via INTRAVENOUS
  Filled 2020-12-01: qty 1000

## 2020-12-01 MED ORDER — PROPRANOLOL HCL 20 MG/5ML PO SOLN
20.0000 mg | Freq: Two times a day (BID) | ORAL | Status: DC
  Administered 2020-12-01 – 2020-12-03 (×4): 20 mg via ORAL
  Filled 2020-12-01 (×8): qty 5

## 2020-12-01 MED ORDER — LACTATED RINGERS IV SOLN
500.0000 mL | Freq: Once | INTRAVENOUS | Status: AC
  Administered 2020-12-01: 500 mL via INTRAVENOUS

## 2020-12-01 MED ORDER — WITCH HAZEL-GLYCERIN EX PADS
1.0000 | MEDICATED_PAD | CUTANEOUS | Status: DC | PRN
Start: 2020-12-01 — End: 2020-12-03

## 2020-12-01 MED ORDER — SENNOSIDES-DOCUSATE SODIUM 8.6-50 MG PO TABS
2.0000 | ORAL_TABLET | Freq: Every day | ORAL | Status: DC
  Administered 2020-12-03: 2 via ORAL
  Filled 2020-12-01: qty 2

## 2020-12-01 MED ORDER — OXYCODONE-ACETAMINOPHEN 5-325 MG PO TABS
1.0000 | ORAL_TABLET | ORAL | Status: DC | PRN
Start: 2020-12-01 — End: 2020-12-01

## 2020-12-01 MED ORDER — FENTANYL-BUPIVACAINE-NACL 0.5-0.125-0.9 MG/250ML-% EP SOLN
12.0000 mL/h | EPIDURAL | Status: DC | PRN
Start: 2020-12-01 — End: 2020-12-01
  Administered 2020-12-01: 12 mL/h via EPIDURAL

## 2020-12-01 MED ORDER — EPHEDRINE 5 MG/ML INJ: 10.0000 mg | INTRAVENOUS | Status: DC | PRN

## 2020-12-01 MED ORDER — COCONUT OIL OIL
1.0000 "application " | TOPICAL_OIL | Status: DC | PRN
  Filled 2020-12-01: qty 120

## 2020-12-01 MED ORDER — OXYTOCIN 10 UNIT/ML IJ SOLN
INTRAMUSCULAR | Status: AC
  Filled 2020-12-01: qty 2

## 2020-12-01 MED ORDER — BENZOCAINE-MENTHOL 20-0.5 % EX AERO: 1.0000 "application " | INHALATION_SPRAY | CUTANEOUS | Status: DC | PRN

## 2020-12-01 MED ORDER — LIDOCAINE HCL (PF) 1 % IJ SOLN
INTRAMUSCULAR | Status: DC | PRN
  Administered 2020-12-01: 1 mL via SUBCUTANEOUS

## 2020-12-01 MED ORDER — ACETAMINOPHEN 325 MG PO TABS
650.0000 mg | ORAL_TABLET | ORAL | Status: DC | PRN
  Administered 2020-12-02: 650 mg via ORAL
  Filled 2020-12-01: qty 2

## 2020-12-01 MED ORDER — PROPRANOLOL HCL 20 MG/5ML PO SOLN
40.0000 mg | Freq: Two times a day (BID) | ORAL | Status: DC
  Administered 2020-12-01: 20 mg via ORAL
  Filled 2020-12-01 (×2): qty 10

## 2020-12-01 MED ORDER — IBUPROFEN 600 MG PO TABS
600.0000 mg | ORAL_TABLET | Freq: Four times a day (QID) | ORAL | Status: DC
  Administered 2020-12-01 – 2020-12-03 (×5): 600 mg via ORAL
  Filled 2020-12-01 (×5): qty 1

## 2020-12-01 MED ORDER — METOCLOPRAMIDE HCL 10 MG PO TABS
10.0000 mg | ORAL_TABLET | Freq: Once | ORAL | Status: DC
Start: 2020-12-01 — End: 2020-12-03
  Filled 2020-12-01: qty 1

## 2020-12-01 MED ORDER — LIDOCAINE HCL (PF) 1 % IJ SOLN
30.0000 mL | INTRAMUSCULAR | Status: DC | PRN
Start: 2020-12-01 — End: 2020-12-01
  Filled 2020-12-01: qty 30

## 2020-12-01 MED ORDER — MISOPROSTOL 25 MCG QUARTER TABLET
25.0000 ug | ORAL_TABLET | ORAL | Status: DC | PRN
Start: 2020-12-01 — End: 2020-12-01

## 2020-12-01 MED ORDER — PRENATAL MULTIVITAMIN CH: 1.0000 | ORAL_TABLET | Freq: Every day | ORAL | Status: DC

## 2020-12-01 MED ORDER — PHENYLEPHRINE 40 MCG/ML (10ML) SYRINGE FOR IV PUSH (FOR BLOOD PRESSURE SUPPORT)
80.0000 ug | PREFILLED_SYRINGE | INTRAVENOUS | Status: DC | PRN
Start: 2020-12-01 — End: 2020-12-01

## 2020-12-01 MED ORDER — SIMETHICONE 80 MG PO CHEW: 80.0000 mg | CHEWABLE_TABLET | ORAL | Status: DC | PRN

## 2020-12-01 MED ORDER — FENTANYL-BUPIVACAINE-NACL 0.5-0.125-0.9 MG/250ML-% EP SOLN
EPIDURAL | Status: AC
  Filled 2020-12-01: qty 250

## 2020-12-01 MED ORDER — ONDANSETRON HCL 4 MG/2ML IJ SOLN: 4.0000 mg | Freq: Four times a day (QID) | INTRAMUSCULAR | Status: DC | PRN

## 2020-12-01 MED ORDER — SOD CITRATE-CITRIC ACID 500-334 MG/5ML PO SOLN: 30.0000 mL | ORAL | Status: DC | PRN

## 2020-12-01 MED ORDER — FAMOTIDINE 20 MG PO TABS
40.0000 mg | ORAL_TABLET | Freq: Once | ORAL | Status: AC
  Administered 2020-12-02: 40 mg via ORAL
  Filled 2020-12-01: qty 2

## 2020-12-01 MED ORDER — OXYTOCIN-SODIUM CHLORIDE 30-0.9 UT/500ML-% IV SOLN
2.5000 [IU]/h | INTRAVENOUS | Status: DC
  Administered 2020-12-01: 2.5 [IU]/h via INTRAVENOUS

## 2020-12-01 MED ORDER — SODIUM CHLORIDE 0.9 % IV SOLN
INTRAVENOUS | Status: DC | PRN
  Administered 2020-12-01 (×2): 5 mL via EPIDURAL

## 2020-12-01 MED ORDER — LACTATED RINGERS IV SOLN: INTRAVENOUS | Status: DC

## 2020-12-01 MED ORDER — SODIUM CHLORIDE 0.9 % IV SOLN
5.0000 10*6.[IU] | Freq: Once | INTRAVENOUS | Status: AC
  Administered 2020-12-01: 5 10*6.[IU] via INTRAVENOUS
  Filled 2020-12-01: qty 5

## 2020-12-01 MED ORDER — MISOPROSTOL 200 MCG PO TABS
ORAL_TABLET | ORAL | Status: AC
  Filled 2020-12-01: qty 4

## 2020-12-01 MED ORDER — LIDOCAINE-EPINEPHRINE (PF) 1.5 %-1:200000 IJ SOLN
INTRAMUSCULAR | Status: DC | PRN
  Administered 2020-12-01: 3 mL via EPIDURAL

## 2020-12-01 MED ORDER — TETANUS-DIPHTH-ACELL PERTUSSIS 5-2.5-18.5 LF-MCG/0.5 IM SUSY
0.5000 mL | PREFILLED_SYRINGE | Freq: Once | INTRAMUSCULAR | Status: DC
  Filled 2020-12-01: qty 0.5

## 2020-12-01 MED ORDER — IBUPROFEN 600 MG PO TABS
ORAL_TABLET | ORAL | Status: AC
  Filled 2020-12-01: qty 1

## 2020-12-01 MED ORDER — AMMONIA AROMATIC IN INHA
RESPIRATORY_TRACT | Status: AC
  Filled 2020-12-01: qty 10

## 2020-12-01 MED ORDER — ONDANSETRON HCL 4 MG PO TABS
4.0000 mg | ORAL_TABLET | ORAL | Status: DC | PRN
  Filled 2020-12-01: qty 1

## 2020-12-01 MED ORDER — METHYLERGONOVINE MALEATE 0.2 MG/ML IJ SOLN
INTRAMUSCULAR | Status: AC
  Filled 2020-12-01: qty 1

## 2020-12-01 MED ORDER — ONDANSETRON HCL 4 MG/2ML IJ SOLN: 4.0000 mg | INTRAMUSCULAR | Status: DC | PRN

## 2020-12-01 MED ORDER — LAMOTRIGINE 100 MG PO TABS
300.0000 mg | ORAL_TABLET | Freq: Two times a day (BID) | ORAL | Status: DC
  Administered 2020-12-01 – 2020-12-03 (×5): 300 mg via ORAL
  Filled 2020-12-01 (×8): qty 3

## 2020-12-01 MED ORDER — DIPHENHYDRAMINE HCL 50 MG/ML IJ SOLN: 12.5000 mg | INTRAMUSCULAR | Status: DC | PRN

## 2020-12-01 MED ORDER — PHENYLEPHRINE 40 MCG/ML (10ML) SYRINGE FOR IV PUSH (FOR BLOOD PRESSURE SUPPORT): 80.0000 ug | PREFILLED_SYRINGE | INTRAVENOUS | Status: DC | PRN

## 2020-12-01 MED ORDER — DIPHENHYDRAMINE HCL 25 MG PO CAPS: 25.0000 mg | ORAL_CAPSULE | Freq: Four times a day (QID) | ORAL | Status: DC | PRN

## 2020-12-01 MED ORDER — ACETAMINOPHEN 325 MG PO TABS: 650.0000 mg | ORAL_TABLET | ORAL | Status: DC | PRN

## 2020-12-01 MED ORDER — DIBUCAINE (PERIANAL) 1 % EX OINT
1.0000 | TOPICAL_OINTMENT | CUTANEOUS | Status: DC | PRN
Start: 2020-12-01 — End: 2020-12-03

## 2020-12-01 MED ORDER — FERROUS SULFATE 325 (65 FE) MG PO TABS: 325.0000 mg | ORAL_TABLET | Freq: Every day | ORAL | Status: DC

## 2020-12-01 MED ORDER — OXYCODONE-ACETAMINOPHEN 5-325 MG PO TABS: 2.0000 | ORAL_TABLET | ORAL | Status: DC | PRN

## 2020-12-01 MED ORDER — ZOLPIDEM TARTRATE 5 MG PO TABS: 5.0000 mg | ORAL_TABLET | Freq: Every evening | ORAL | Status: DC | PRN

## 2020-12-01 MED ORDER — LACTATED RINGERS IV SOLN: 500.0000 mL | INTRAVENOUS | Status: DC | PRN

## 2020-12-01 MED ORDER — TERBUTALINE SULFATE 1 MG/ML IJ SOLN: 0.2500 mg | Freq: Once | INTRAMUSCULAR | Status: DC | PRN

## 2020-12-01 MED ORDER — TRANEXAMIC ACID-NACL 1000-0.7 MG/100ML-% IV SOLN
INTRAVENOUS | Status: AC
  Filled 2020-12-01: qty 100

## 2020-12-01 NOTE — Progress Notes (Signed)
Spoke with pharmacy about patient's propanolol and lamictal. Pt usually takes this around 8pm and requested these meds. Pharmacy approved giving these two meds off schedule. HR was above 60bpm.

## 2020-12-01 NOTE — H&P (Signed)
OB History & Physical   History of Present Illness:  Chief Complaint: induction   HPI:  Alexandra Heath is a 40 y.o. [id number] female at [redacted]w[redacted]d dated by [place] at [redacted]w[redacted]d, LMP 03/22/20; EDD 12/18/20.  She presents to L&D for scheduled IOL due to cholestasis of pregnancy.  Reports active FM, onset of ctx @ 0300 since pitocin started, currently every 2-3 minutes. Denies LOF or VB     Pregnancy Issues: 1. Desires BTL, sign consents at anatomy US visit [x]  2. Iron deficiency Anemia - 9.4 at [redacted]w[redacted]d - encouraged to start Fe supplementation 3. Advanced maternal age, 40yo at delivery] 4. Epilepsy, currently on Lamictal 300mg  BID, propanolol 40mg  BID 5. Close interval pregnancies  6. Poor weight gain in pregnancy 7. Cholestasis in pregnancy   Maternal Medical History:   Past Medical History:  Diagnosis Date   Epilepsy ([place])    Fibroids 05/14/2015   Rapid heart beat    Scoliosis    Scoliosis    Tachycardia     Past Surgical History:  Procedure Laterality Date   CHOLECYSTECTOMY     GALLBLADDER SURGERY      No Known Allergies  Prior to Admission medications   Medication Sig Start Date End Date Taking? Authorizing Provider  lamoTRIgine 100 MG TBDP Take 3 tablets by mouth 2 (two) times daily. 03/16/19  Yes [provider]  propranolol (INDERAL) 20 MG/5ML solution Take 40 mg by mouth 2 (two) times daily.   Yes [provider]  ferrous sulfate 325 (65 FE) MG tablet Take 325 mg by mouth daily with breakfast.    [provider]  medroxyPROGESTERone (DEPO-PROVERA) 150 MG/ML injection Inject 150 mg into the muscle every 3 (three) months. 01/31/18 04/01/19  [provider]  omeprazole (PRILOSEC) 20 MG capsule Take 20 mg by mouth daily as needed.  04/01/19  [provider]  SUMAtriptan (IMITREX) 50 MG tablet Take 50 mg by mouth every 2 (two) hours as needed for migraine. May repeat in 2 hours if headache persists or recurs.  04/01/19  [provider]      Prenatal care site: [facility] History: She  reports that she has never smoked. She has never used smokeless tobacco. She reports that she does not drink alcohol and does not use drugs.  Family History: family history includes Anemia in her mother; Depression in her mother; Diabetes in her father; HIV in her mother; Hypertension in her father and maternal grandmother; Migraines in her mother.   Review of Systems: A full review of systems was performed and negative except as noted in the HPI.     Physical Exam:  Vital Signs: BP 112/83 (BP Location: Left Arm)   Pulse 98   Temp 98 F (36.7 C) (Oral)   Resp 12   Ht 5\' 6"  (1.676 m)   Wt 62.6 kg   LMP 03/22/2020 (Exact Date)   BMI 22.27 kg/m   General: no acute distress.  HEENT: normocephalic, atraumatic Heart: regular rate & rhythm.  No murmurs/rubs/gallops Lungs: clear to auscultation bilaterally, normal respiratory effort Abdomen: soft, gravid, non-tender;  EFW: 6.5lbs Pelvic:   External: Normal external female genitalia  Cervix: Dilation: 3 / Effacement (%): 60, 70 / Station: -2    Extremities: non-tender, symmetric, no edema bilaterally.  DTRs: 2+  Neurologic: Alert & oriented x 3.    Results for orders placed or performed during the hospital encounter of 12/01/20 (from the past 24 hour(s))  CBC  Status: Abnormal   Collection Time: 12/01/20 12:52 AM  Result Value Ref Range   WBC 6.2 4.0 - 10.5 K/uL   RBC 3.20 (L) 3.87 - 5.11 MIL/uL   Hemoglobin 8.7 (L) 12.0 - 15.0 g/dL   HCT 26.5 (L) 36.0 - 46.0 %   MCV 82.8 80.0 - 100.0 fL   MCH 27.2 26.0 - 34.0 pg   MCHC 32.8 30.0 - 36.0 g/dL   RDW 14.5 11.5 - 15.5 %   Platelets 257 150 - 400 K/uL   nRBC 0.0 0.0 - 0.2 %  Type and screen     Status: None   Collection Time: 12/01/20 12:52 AM  Result Value Ref Range   ABO/RH(D) A POS    Antibody Screen NEG    Sample Expiration      12/04/2020,2359 Performed at [facility], [address] [place], [place] [address]     Pertinent Results:  Prenatal Labs: Blood type/Rh A Pos  Antibody screen neg  Rubella Immune  Varicella Immune  RPR NR  HBsAg Neg  HIV NR  GC neg  Chlamydia neg  Genetic screening negative  1 hour GTT  149  3 hour GTT  [id number]  GBS  Pos   FHT: 130bpm, mod variability, + accels, no decels TOCO: q2-41min  SVE:  Dilation: 3 / Effacement (%): 60, 70 / Station: -2    Cephalic by leopolds/SVE per RN  No results found.  Assessment:  Alexandra Heath is a 40 y.o. [id number] female at [redacted]w[redacted]d with cholestasis of pregnancy.   Plan:  1. Admit to Labor & Delivery; consents reviewed and obtained - COVID neg - iron deficiency anemia 2. Fetal Well being  - Fetal Tracing: Cat I - Group B Streptococcus ppx indicated: Pos - Presentation: cephalic confirmed by exam   3. Routine OB: - Prenatal labs reviewed, as above - Rh A pos - CBC, T&S, RPR on admit - Clear fluids, IVF  4. Induction of Labor -  Contractions: external toco in place -  Pelvis proven to 8lbs -  Plan for induction with pitocin and AROM -  Plan for continuous fetal monitoring  -  Maternal pain control as desired - Anticipate vaginal delivery  5. Post Partum Planning: - Infant feeding: formula - Contraception: BTL - tdap: 10/11/20 - flu declined  Francetta Found, CNM 12/01/20 4:23 AM

## 2020-12-01 NOTE — Anesthesia Preprocedure Evaluation (Signed)
Anesthesia Evaluation  Patient identified by MRN, date of birth, ID band Patient awake    Reviewed: Allergy & Precautions, NPO status , Patient's Chart, lab work & pertinent test results  History of Anesthesia Complications Negative for: history of anesthetic complications  Airway Mallampati: III  TM Distance: >3 FB Neck ROM: full    Dental  (+) Chipped   Pulmonary neg pulmonary ROS,    Pulmonary exam normal        Cardiovascular Exercise Tolerance: Good (-) hypertensionnegative cardio ROS Normal cardiovascular exam     Neuro/Psych  Headaches, Seizures -,   Neuromuscular disease    GI/Hepatic negative GI ROS,   Endo/Other    Renal/GU   negative genitourinary   Musculoskeletal   Abdominal   Peds  Hematology negative hematology ROS (+)   Anesthesia Other Findings Past Medical History: No date: Epilepsy ([place]) 05/14/2015: Fibroids No date: Rapid heart beat No date: Scoliosis No date: Scoliosis No date: Tachycardia  Past Surgical History: No date: CHOLECYSTECTOMY No date: GALLBLADDER SURGERY  BMI    Body Mass Index: 22.27 kg/m      Reproductive/Obstetrics (+) Pregnancy                             Anesthesia Physical Anesthesia Plan  ASA: 3  Anesthesia Plan: Epidural   Post-op Pain Management:    Induction:   PONV Risk Score and Plan:   Airway Management Planned: Natural Airway  Additional Equipment:   Intra-op Plan:   Post-operative Plan:   Informed Consent: I have reviewed the patients History and Physical, chart, labs and discussed the procedure including the risks, benefits and alternatives for the proposed anesthesia with the patient or authorized representative who has indicated his/her understanding and acceptance.     Dental Advisory Given  Plan Discussed with: Anesthesiologist  Anesthesia Plan Comments: (Patient reports no bleeding problems and no  anticoagulant use.   Patient consented for risks of anesthesia including but not limited to:  - adverse reactions to medications - risk of bleeding, infection and or nerve damage from epidural that could lead to paralysis - risk of headache or failed epidural - nerve damage due to positioning - that if epidural is used for C-section that there is a chance of epidural failure requiring spinal placement or conversion to GA - Damage to heart, brain, lungs, other parts of body or loss of life  Patient voiced understanding.)        Anesthesia Quick Evaluation

## 2020-12-01 NOTE — Discharge Instructions (Signed)

## 2020-12-01 NOTE — Progress Notes (Signed)
Pt is DOD and she desires PP BTL tomorrow. I have spoken with the patient and she has no contraindications for the surgery . She confirms her desire . Risks reviewed . Consent signed . NPO after midnight

## 2020-12-01 NOTE — Discharge Summary (Signed)
Obstetrical Discharge Summary  Patient Name: Alexandra Heath DOB: [birth year] MRN: [id number]  Date of Admission: 12/01/2020 Date of Delivery: 12/01/2020 Delivered by: Lucrezia Europe, CNM Date of Discharge: 12/03/2020  Primary OB: [id number] last menstrual period was 03/22/2020 (exact date). EDC Estimated Date of Delivery: 12/18/20 Gestational Age at Delivery: [redacted]w[redacted]d   Antepartum complications:  1. Desires BTL, sign consents at anatomy US visit [x]  2. Iron deficiency Anemia - 9.4 at [redacted]w[redacted]d - encouraged to start Fe supplementation 3. Advanced maternal age, 40yo at delivery] 4. Epilepsy, currently on Lamictal 300mg  BID, propanolol 40mg  BID 5. Close interval pregnancies  6. Poor weight gain in pregnancy 7. 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 Date of Delivery: 12/01/2020 Delivered By: Lucrezia Europe, CNM Delivery Type: spontaneous vaginal delivery Anesthesia: epidural Placenta:  spontaneous Laceration: none Episiotomy: none Newborn Data: Live born female  Birth Weight:  6 lbs 6.3oz, 2900g APGAR: 8, 9  Newborn Delivery   Birth date/time: 12/01/2020 09:14:00 Delivery type: Vaginal, Spontaneous     Postpartum Procedures:  Postpartum BTL performed by Dr. Gaylyn Cheers:  Flavia Shipper Postnatal Depression Scale Screening Tool 12/01/2020 12/01/2020 12/26/2019 12/26/2019  I have been able to laugh and see the funny side of things. (No Data) 0 0 (No Data)  I have looked forward with enjoyment to things. - 0 0 -  I have blamed myself unnecessarily when things went wrong. - 1 0 -  I have been anxious or worried for no good reason. - 2 2 -  I have felt scared or panicky for no good reason. - 1 0 -  Things have been getting on top of me. - 2 0 -  I have been so unhappy that I have had difficulty sleeping. - 1 0 -  I have felt sad or miserable. - 1 1 -  I have been so unhappy that I have been crying. - 1 1 -  The thought of harming myself has occurred to me. - 0 0 -  Edinburgh Postnatal Depression Scale Total - 9 4 -      Post partum course:  Patient had an uncomplicated postpartum course.  By time of discharge on PPD#2, her pain was controlled on oral pain medications; she had appropriate lochia and was ambulating, voiding without difficulty and tolerating regular diet.  Postpartum BTL was performed prior to discharge. She was deemed stable for discharge to home.    Discharge Physical Exam:  BP 121/81 (BP Location: Left Arm)   Pulse 74   Temp 98.2 F (36.8 C) (Oral)   Resp 20   Ht 5\' 6"  (1.676 m)   Wt 62.6 kg  LMP 03/22/2020 (Exact Date)   SpO2 100%   Breastfeeding Unknown   BMI 22.27 kg/m   General: NAD CV: RRR Pulm: nl effort ABD: s/nd/nt, fundus firm and below the umbilicus Lochia: moderate Perineum: well approximated/intact DVT Evaluation: LE non-ttp, no evidence of DVT on exam.  Hemoglobin  Date Value Ref Range Status  12/02/2020 8.4  (L) 12.0 - 15.0 g/dL Final   HGB  Date Value Ref Range Status  01/18/2014 12.6 12.0 - 16.0 g/dL Final   HCT  Date Value Ref Range Status  12/02/2020 25.6 (L) 36.0 - 46.0 % Final  01/18/2014 38.6 35.0 - 47.0 % Final     Disposition: stable, discharge to home. Baby Feeding: formula Baby Disposition: home with mom  Rh Immune globulin given: n/a, A+ Rubella vaccine given: immune Varicella vaccine given: immune Tdap vaccine given in AP or PP setting: 10/11/2020 Flu vaccine given in AP or PP setting: declined  Contraception: BTL completed in patient  Prenatal Labs:  Blood type/Rh A Pos  Antibody screen neg  Rubella Immune  Varicella Immune  RPR NR  HBsAg Neg  HIV NR  GC neg  Chlamydia neg  Genetic screening negative  1 hour GTT  149  3 hour GTT  [id number]  GBS  Pos     Plan:  Alexandra Heath was discharged to home in good condition. Follow-up appointment with Dr. Ouida Sills in 2 weeks and with delivering provider in 6 weeks.  Discharge Medications: Allergies as of 12/03/2020   No Known Allergies      Medication List     TAKE these medications    ferrous sulfate 325 (65 FE) MG tablet Take 325 mg by mouth daily with breakfast.   lamoTRIgine 100 MG Tbdp Take 3 tablets by mouth 2 (two) times daily.   oxyCODONE 5 MG immediate release tablet Commonly known as: Oxy IR/ROXICODONE Take 1-2 tablets (5-10 mg total) by mouth every 4 (four) hours as needed for moderate pain or severe pain.   propranolol 20 MG/5ML solution Commonly known as: INDERAL Take 5 mLs (20 mg total) by mouth 2 (two) times daily. What changed: how much to take         Follow-up Information     Gertie Fey, CNM Follow up in 6 week(s).   Specialty: Certified Nurse Midwife Why: 6wk postpartum Contact information: [place] [address] [phone]         Schermerhorn, Gwen Her, MD. Schedule an appointment as soon as possible for a visit in 2  week(s).   Specialty: Obstetrics and Gynecology Why: post-op incision check Contact information: [address] [place] [address] [phone]                 Signed:  Judith Part 12/03/2020  8:20 AM

## 2020-12-01 NOTE — Progress Notes (Signed)
Labor Progress Note  Alexandra Heath is a 40 y.o. [id number] at [redacted]w[redacted]d by ultrasound admitted for IOL for cholestasis.   Subjective: comfortable after epidural  Objective: BP 127/78   Pulse 75   Temp 98 F (36.7 C) (Oral)   Resp 12   Ht 5\' 6"  (1.676 m)   Wt 62.6 kg   LMP 03/22/2020 (Exact Date)   SpO2 100%   BMI 22.27 kg/m  Notable VS details: reviewed  Fetal Assessment: FHT:  FHR: 120 bpm, variability: moderate,  accelerations:  Present,  decelerations:  Absent Category/reactivity:  Category I UC:   regular, every 2-6 minutes SVE:   6/80/-1, posterior, soft  Membrane status:AROM at 0631 Amniotic color: copious amount clear  Labs: Lab Results  Component Value Date   WBC 6.2 12/01/2020   HGB 8.7 (L) 12/01/2020   HCT 26.5 (L) 12/01/2020   MCV 82.8 12/01/2020   PLT 257 12/01/2020    Assessment / Plan: Induction of labor due to cholestasis,  progressing well on pitocin  Labor: Progressing normally, AROM now Hx elevated LFTs- normal CMP on admit.  Sz d/o- lamictal level ordered by Dr Ouida Sills., will order home meds.  Preeclampsia:  no e/o Pre-E Fetal Wellbeing:  Category I Pain Control:  Epidural I/D:   GBS Pos, PCN x 2 doses given.  Anticipated MOD:  NSVD  Francetta Found, CNM 12/01/2020, 6:36 AM

## 2020-12-01 NOTE — Anesthesia Procedure Notes (Signed)
Epidural Patient location during procedure: OB Start time: 12/01/2020 5:52 AM End time: 12/01/2020 5:57 AM  Staffing Anesthesiologist: Damaree Sargent, Precious Haws, MD Performed: anesthesiologist   Preanesthetic Checklist Completed: patient identified, IV checked, site marked, risks and benefits discussed, surgical consent, monitors and equipment checked, pre-op evaluation and timeout performed  Epidural Patient position: sitting Prep: ChloraPrep Patient monitoring: heart rate, continuous pulse ox and blood pressure Approach: midline Location: L3-L4 Injection technique: LOR saline  Needle:  Needle type: Tuohy  Needle gauge: 17 G Needle length: 9 cm and 9 Needle insertion depth: 4.5 cm Catheter type: closed end flexible Catheter size: 19 Gauge Catheter at skin depth: 9 cm Test dose: negative and 1.5% lidocaine with Epi 1:200 K  Assessment Sensory level: T10 Events: blood not aspirated, injection not painful, no injection resistance, no paresthesia and negative IV test  Additional Notes 1 attempt Pt. Evaluated and documentation done after procedure finished. Patient identified. Risks/Benefits/Options discussed with patient including but not limited to bleeding, infection, nerve damage, paralysis, failed block, incomplete pain control, headache, blood pressure changes, nausea, vomiting, reactions to medication both or allergic, itching and postpartum back pain. Confirmed with bedside nurse the patient's most recent platelet count. Confirmed with patient that they are not currently taking any anticoagulation, have any bleeding history or any family history of bleeding disorders. Patient expressed understanding and wished to proceed. All questions were answered. Sterile technique was used throughout the entire procedure. Please see nursing notes for vital signs. Test dose was given through epidural catheter and negative prior to continuing to dose epidural or start infusion. Warning signs of  high block given to the patient including shortness of breath, tingling/numbness in hands, complete motor block, or any concerning symptoms with instructions to call for help. Patient was given instructions on fall risk and not to get out of bed. All questions and concerns addressed with instructions to call with any issues or inadequate analgesia.    Patient tolerated the insertion well without immediate complications.Reason for block:procedure for pain

## 2020-12-02 ENCOUNTER — Inpatient Hospital Stay: Payer: Medicaid Other | Admitting: Anesthesiology

## 2020-12-02 ENCOUNTER — Encounter
Admission: EM | Disposition: A | Discharge status: Home / Self Care | Payer: Self-pay | Source: Home / Self Care | Attending: Obstetrics and Gynecology

## 2020-12-02 HISTORY — PX: TUBAL LIGATION: SHX77

## 2020-12-02 LAB — CBC
HCT: 25.6 % — ABNORMAL LOW (ref 36.0–46.0)
Hemoglobin: 8.4 g/dL — ABNORMAL LOW (ref 12.0–15.0)
MCH: 27.2 pg (ref 26.0–34.0)
MCHC: 32.8 g/dL (ref 30.0–36.0)
MCV: 82.8 fL (ref 80.0–100.0)
Platelets: 240 10*3/uL (ref 150–400)
RBC: 3.09 MIL/uL — ABNORMAL LOW (ref 3.87–5.11)
RDW: 14.7 % (ref 11.5–15.5)
WBC: 9 10*3/uL (ref 4.0–10.5)
nRBC: 0 % (ref 0.0–0.2)

## 2020-12-02 LAB — LAMOTRIGINE LEVEL: Lamotrigine Lvl: 7.2 ug/mL (ref 2.0–20.0)

## 2020-12-02 SURGERY — LIGATION, FALLOPIAN TUBE, POSTPARTUM
Anesthesia: General

## 2020-12-02 MED ORDER — BUPIVACAINE HCL 0.5 % IJ SOLN
INTRAMUSCULAR | Status: DC | PRN
  Administered 2020-12-02: 3 mL

## 2020-12-02 MED ORDER — BUPIVACAINE HCL (PF) 0.5 % IJ SOLN
INTRAMUSCULAR | Status: AC
  Filled 2020-12-02: qty 30

## 2020-12-02 MED ORDER — DEXAMETHASONE SODIUM PHOSPHATE 10 MG/ML IJ SOLN
INTRAMUSCULAR | Status: DC | PRN
  Administered 2020-12-02: 6 mg via INTRAVENOUS

## 2020-12-02 MED ORDER — DEXAMETHASONE SODIUM PHOSPHATE 10 MG/ML IJ SOLN
INTRAMUSCULAR | Status: AC
  Filled 2020-12-02: qty 1

## 2020-12-02 MED ORDER — MIDAZOLAM HCL 2 MG/2ML IJ SOLN
INTRAMUSCULAR | Status: DC | PRN
  Administered 2020-12-02: 2 mg via INTRAVENOUS

## 2020-12-02 MED ORDER — SUCCINYLCHOLINE CHLORIDE 200 MG/10ML IV SOSY
PREFILLED_SYRINGE | INTRAVENOUS | Status: DC | PRN
  Administered 2020-12-02: 100 mg via INTRAVENOUS

## 2020-12-02 MED ORDER — MEPERIDINE HCL 25 MG/ML IJ SOLN: 6.2500 mg | INTRAMUSCULAR | Status: DC | PRN

## 2020-12-02 MED ORDER — ONDANSETRON HCL 4 MG/2ML IJ SOLN
INTRAMUSCULAR | Status: DC | PRN
  Administered 2020-12-02: 4 mg via INTRAVENOUS

## 2020-12-02 MED ORDER — KETOROLAC TROMETHAMINE 30 MG/ML IJ SOLN
INTRAMUSCULAR | Status: DC | PRN
  Administered 2020-12-02: 30 mg via INTRAVENOUS

## 2020-12-02 MED ORDER — FERROUS SULFATE 325 (65 FE) MG PO TABS
325.0000 mg | ORAL_TABLET | Freq: Two times a day (BID) | ORAL | Status: DC
  Administered 2020-12-02 – 2020-12-03 (×3): 325 mg via ORAL
  Filled 2020-12-02 (×3): qty 1

## 2020-12-02 MED ORDER — MIDAZOLAM HCL 2 MG/2ML IJ SOLN
INTRAMUSCULAR | Status: AC
  Filled 2020-12-02: qty 2

## 2020-12-02 MED ORDER — PROPOFOL 10 MG/ML IV BOLUS
INTRAVENOUS | Status: AC
  Filled 2020-12-02: qty 20

## 2020-12-02 MED ORDER — KETOROLAC TROMETHAMINE 30 MG/ML IJ SOLN
30.0000 mg | Freq: Once | INTRAMUSCULAR | Status: DC
  Filled 2020-12-02: qty 1

## 2020-12-02 MED ORDER — ROCURONIUM BROMIDE 100 MG/10ML IV SOLN
INTRAVENOUS | Status: DC | PRN
  Administered 2020-12-02: 20 mg via INTRAVENOUS

## 2020-12-02 MED ORDER — FENTANYL CITRATE (PF) 100 MCG/2ML IJ SOLN
INTRAMUSCULAR | Status: AC
  Administered 2020-12-02: 25 ug via INTRAVENOUS
  Filled 2020-12-02: qty 2

## 2020-12-02 MED ORDER — OXYCODONE HCL 5 MG PO TABS
5.0000 mg | ORAL_TABLET | ORAL | Status: DC | PRN
  Administered 2020-12-02: 10 mg via ORAL
  Administered 2020-12-03 (×2): 5 mg via ORAL
  Filled 2020-12-02: qty 1
  Filled 2020-12-02: qty 2
  Filled 2020-12-02: qty 1

## 2020-12-02 MED ORDER — ROCURONIUM BROMIDE 10 MG/ML (PF) SYRINGE
PREFILLED_SYRINGE | INTRAVENOUS | Status: AC
  Filled 2020-12-02: qty 10

## 2020-12-02 MED ORDER — LIDOCAINE HCL (CARDIAC) PF 100 MG/5ML IV SOSY
PREFILLED_SYRINGE | INTRAVENOUS | Status: DC | PRN
  Administered 2020-12-02: 60 mg via INTRAVENOUS

## 2020-12-02 MED ORDER — FENTANYL CITRATE (PF) 100 MCG/2ML IJ SOLN
25.0000 ug | INTRAMUSCULAR | Status: DC | PRN
  Administered 2020-12-02: 25 ug via INTRAVENOUS

## 2020-12-02 MED ORDER — PROPOFOL 10 MG/ML IV BOLUS
INTRAVENOUS | Status: DC | PRN
  Administered 2020-12-02: 120 mg via INTRAVENOUS

## 2020-12-02 MED ORDER — FENTANYL CITRATE (PF) 100 MCG/2ML IJ SOLN
INTRAMUSCULAR | Status: DC | PRN
  Administered 2020-12-02: 50 ug via INTRAVENOUS

## 2020-12-02 MED ORDER — SUGAMMADEX SODIUM 200 MG/2ML IV SOLN
INTRAVENOUS | Status: DC | PRN
  Administered 2020-12-02: 150 mg via INTRAVENOUS

## 2020-12-02 MED ORDER — SUCCINYLCHOLINE CHLORIDE 200 MG/10ML IV SOSY
PREFILLED_SYRINGE | INTRAVENOUS | Status: AC
  Filled 2020-12-02: qty 10

## 2020-12-02 MED ORDER — LIDOCAINE HCL (PF) 2 % IJ SOLN
INTRAMUSCULAR | Status: AC
  Filled 2020-12-02: qty 5

## 2020-12-02 MED ORDER — FENTANYL CITRATE (PF) 100 MCG/2ML IJ SOLN
INTRAMUSCULAR | Status: AC
  Filled 2020-12-02: qty 2

## 2020-12-02 MED ORDER — ONDANSETRON HCL 4 MG/2ML IJ SOLN
INTRAMUSCULAR | Status: AC
  Filled 2020-12-02: qty 2

## 2020-12-02 SURGICAL SUPPLY — 34 items
APPLICATOR COTTON TIP 6 STRL (MISCELLANEOUS) ×1 IMPLANT
APPLICATOR COTTON TIP 6IN STRL (MISCELLANEOUS) ×2
BLADE SURG SZ11 CARB STEEL (BLADE) ×2 IMPLANT
CHLORAPREP W/TINT 26 (MISCELLANEOUS) ×2 IMPLANT
DERMABOND ADVANCED (GAUZE/BANDAGES/DRESSINGS) ×1
DERMABOND ADVANCED .7 DNX12 (GAUZE/BANDAGES/DRESSINGS) ×1 IMPLANT
DRAPE LAPAROTOMY 77X122 PED (DRAPES) ×2 IMPLANT
DRSG TEGADERM 2-3/8X2-3/4 SM (GAUZE/BANDAGES/DRESSINGS) IMPLANT
DRSG TEGADERM 4X4.75 (GAUZE/BANDAGES/DRESSINGS) IMPLANT
ELECT CAUTERY BLADE 6.4 (BLADE) ×2 IMPLANT
ELECT REM PT RETURN 9FT ADLT (ELECTROSURGICAL) ×2
ELECTRODE REM PT RTRN 9FT ADLT (ELECTROSURGICAL) ×1 IMPLANT
GAUZE 4X4 16PLY ~~LOC~~+RFID DBL (SPONGE) ×2 IMPLANT
GLOVE SURG SYN 8.0 (GLOVE) ×2 IMPLANT
GOWN STRL REUS W/ TWL LRG LVL3 (GOWN DISPOSABLE) ×1 IMPLANT
GOWN STRL REUS W/ TWL XL LVL3 (GOWN DISPOSABLE) ×1 IMPLANT
GOWN STRL REUS W/TWL LRG LVL3 (GOWN DISPOSABLE) ×1
GOWN STRL REUS W/TWL XL LVL3 (GOWN DISPOSABLE) ×1
KIT TURNOVER KIT A (KITS) ×2 IMPLANT
LABEL OR SOLS (LABEL) ×2 IMPLANT
MANIFOLD NEPTUNE II (INSTRUMENTS) ×2 IMPLANT
NEEDLE HYPO 22GX1.5 SAFETY (NEEDLE) ×2 IMPLANT
NS IRRIG 500ML POUR BTL (IV SOLUTION) ×2 IMPLANT
PACK BASIN MINOR ARMC (MISCELLANEOUS) ×2 IMPLANT
SCRUB EXIDINE 4% CHG 4OZ (MISCELLANEOUS) IMPLANT
SPONGE GAUZE 2X2 8PLY STRL LF (GAUZE/BANDAGES/DRESSINGS) ×2 IMPLANT
STRIP CLOSURE SKIN 1/4X4 (GAUZE/BANDAGES/DRESSINGS) IMPLANT
SUT PLAIN GUT 0 (SUTURE) ×4 IMPLANT
SUT VIC AB 2-0 UR6 27 (SUTURE) ×2 IMPLANT
SUT VIC AB 4-0 SH 27 (SUTURE) ×1
SUT VIC AB 4-0 SH 27XANBCTRL (SUTURE) ×1 IMPLANT
SWABSTK COMLB BENZOIN TINCTURE (MISCELLANEOUS) IMPLANT
SYR 10ML LL (SYRINGE) ×2 IMPLANT
WATER STERILE IRR 500ML POUR (IV SOLUTION) IMPLANT

## 2020-12-02 NOTE — TOC Initial Note (Signed)
Transition of Care [facility]) - Initial/Assessment Note    Patient Details  Name: Alexandra Heath MRN: [id number] Date of Birth: 1980/07/16  Transition of [facility] CM/SW Contact:    Kerin Salen, RN Phone Number: 12/02/2020, 10:53 AM  Clinical Narrative:  EDS follow up with MOB, who is holding NB smiling, FOB in recliner at bedside. MOB voices having everything she needs for NB, denies having history of depression, had a Nephew who was killed month a go, they were really close. MOB voices feeling safe at home with FOB and other kids, excited about getting tubal ligation done today. Will bottle feed, currently receiving WIC and DSS services will contact assigned caseworker to register NB. Patient given PPD list of resources to contact if needed.   No TOC needs assessed can discharge home when medically stable.                    Patient Goals and CMS Choice        Expected Discharge Plan and Services                                                Prior Living Arrangements/Services                       Activities of Daily Living Home Assistive Devices/Equipment: None ADL Screening (condition at time of admission) Patient's cognitive ability adequate to safely complete daily activities?: Yes Is the patient deaf or have difficulty hearing?: No Does the patient have difficulty seeing, even when wearing glasses/contacts?: No Does the patient have difficulty concentrating, remembering, or making decisions?: No Patient able to express need for assistance with ADLs?: Yes Does the patient have difficulty dressing or bathing?: No Independently performs ADLs?: Yes (appropriate for developmental age) Does the patient have difficulty walking or climbing stairs?: No Weakness of Legs: None Weakness of Arms/Hands: None  Permission Sought/Granted                  Emotional Assessment              Admission diagnosis:  Cholestasis of pregnancy [O26.619,  K83.1] Patient Active Problem List   Diagnosis Date Noted   Cholestasis of pregnancy 12/01/2020   Pregnancy 11/13/2020   Indication for care in labor or delivery 12/26/2019   AMA (advanced maternal age) multigravida 35+, second trimester 12/26/2019   Elevated blood-pressure reading without diagnosis of hypertension 12/26/2019   Encounter for supervision of high risk pregnancy due to advanced maternal age in primigravida 07/03/2019   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

## 2020-12-02 NOTE — Progress Notes (Signed)
   12/02/20 1000  Clinical Encounter Type  Visited With Patient and family together  Visit Type Initial;Spiritual support  Referral From Nurse  Consult/Referral To Chaplain  Spiritual Encounters  Spiritual Needs Prayer;Emotional  Chaplain Zoe Nordin visited Alexandra Heath in room 350. Alexandra Heath was holding her newborn baby boy and her husband was laying in the recliner next to her bed.  Alexandra Heath is about to go have a minor surgery. She stated she was nervous but ready to get it done.   I provided spiritual and emotional support and words of encouragement.

## 2020-12-02 NOTE — Transfer of Care (Signed)
Immediate Anesthesia Transfer of Care Note  Patient: Alexandra Heath  Procedure(s) Performed: POST PARTUM TUBAL LIGATION  Patient Location: PACU  Anesthesia Type:General  Level of Consciousness: sedated  Airway & Oxygen Therapy: Patient Spontanous Breathing and Patient connected to face mask oxygen  Post-op Assessment: Report given to RN and Post -op Vital signs reviewed and stable  Post vital signs: Reviewed and stable  Last Vitals:  Vitals Value Taken Time  BP 126/79 12/02/20 1445  Temp    Pulse 91 12/02/20 1447  Resp 21 12/02/20 1447  SpO2 100 % 12/02/20 1447  Vitals shown include unvalidated device data.  Last Pain:  Vitals:   12/02/20 1206  TempSrc: Temporal  PainSc: 3       Patients Stated Pain Goal: 0 (40/98/11 9147)  Complications: No notable events documented.

## 2020-12-02 NOTE — Anesthesia Preprocedure Evaluation (Addendum)
Anesthesia Evaluation  Patient identified by MRN, date of birth, ID band Patient awake    Reviewed: Allergy & Precautions, NPO status , Patient's Chart, lab work & pertinent test results  History of Anesthesia Complications Negative for: history of anesthetic complications  Airway Mallampati: III  TM Distance: >3 FB Neck ROM: full    Dental  (+) Chipped, Missing, Poor Dentition, Loose   Pulmonary neg pulmonary ROS,    Pulmonary exam normal        Cardiovascular Exercise Tolerance: Good (-) hypertensionNormal cardiovascular exam+ dysrhythmias (Tachycardia)      Neuro/Psych  Headaches, Seizures -,  PSYCHIATRIC DISORDERS Anxiety Depression  Neuromuscular disease    GI/Hepatic negative GI ROS,   Endo/Other    Renal/GU   negative genitourinary   Musculoskeletal   Abdominal   Peds  Hematology negative hematology ROS (+)   Anesthesia Other Findings Past Medical History: No date: Epilepsy ([place]) 05/14/2015: Fibroids No date: Rapid heart beat No date: Scoliosis No date: Scoliosis No date: Tachycardia  Past Surgical History: No date: CHOLECYSTECTOMY No date: GALLBLADDER SURGERY  BMI    Body Mass Index: 22.27 kg/m      Reproductive/Obstetrics (+) Pregnancy                            Anesthesia Physical  Anesthesia Plan  ASA: 3  Anesthesia Plan: General   Post-op Pain Management:    Induction: Intravenous  PONV Risk Score and Plan: 3 and Propofol infusion, Midazolam and Ondansetron  Airway Management Planned: Oral ETT  Additional Equipment:   Intra-op Plan:   Post-operative Plan: Extubation in OR  Informed Consent: I have reviewed the patients History and Physical, chart, labs and discussed the procedure including the risks, benefits and alternatives for the proposed anesthesia with the patient or authorized representative who has indicated his/her understanding and  acceptance.     Dental Advisory Given  Plan Discussed with: Anesthesiologist, CRNA and Surgeon  Anesthesia Plan Comments: (   )       Anesthesia Quick Evaluation

## 2020-12-02 NOTE — Anesthesia Procedure Notes (Signed)
Procedure Name: Intubation Date/Time: 12/02/2020 2:07 PM Performed by: Aline Brochure, CRNA Pre-anesthesia Checklist: Patient identified, Patient being monitored, Timeout performed, Emergency Drugs available and Suction available Patient Re-evaluated:Patient Re-evaluated prior to induction Oxygen Delivery Method: Circle system utilized Preoxygenation: Pre-oxygenation with 100% oxygen Induction Type: IV induction Ventilation: Mask ventilation without difficulty Laryngoscope Size: 3 and McGraph Grade View: Grade I Tube type: Oral Tube size: 7.0 mm Number of attempts: 1 Airway Equipment and Method: Stylet and Video-laryngoscopy Placement Confirmation: ETT inserted through vocal cords under direct vision, positive ETCO2 and breath sounds checked- equal and bilateral Secured at: 21 cm Tube secured with: Tape Dental Injury: Teeth and Oropharynx as per pre-operative assessment

## 2020-12-02 NOTE — Brief Op Note (Signed)
12/02/2020  2:35 PM  PATIENT:  Alexandra Heath  40 y.o. female  PRE-OPERATIVE DIAGNOSIS:  elective sterilization Tube Removal  POST-OPERATIVE DIAGNOSIS: elective sterilization  Tube Removal  PROCEDURE:  Procedure(s): POST PARTUM TUBAL LIGATION (N/A), bilateral   SURGEON:  Surgeon(s) and Role:    * Karlynn Furrow, Gwen Her, MD - Primary  PHYSICIAN ASSISTANT: cst  ASSISTANTS: none   ANESTHESIA:   general  EBL:  minimal, IOF 300 cc  BLOOD ADMINISTERED:none  DRAINS: none   LOCAL MEDICATIONS USED:  MARCAINE     SPECIMEN:  Source of Specimen:  portion bilateral fallopian tube   DISPOSITION OF SPECIMEN:  PATHOLOGY  COUNTS:  YES  TOURNIQUET:  * No tourniquets in log *  DICTATION: .Other Dictation: Dictation Number verbal  PLAN OF CARE:  continue inpatient status  PATIENT DISPOSITION:  PACU - hemodynamically stable.   Delay start of Pharmacological VTE agent (>24hrs) due to surgical blood loss or risk of bleeding: not applicable

## 2020-12-02 NOTE — Progress Notes (Signed)
Ready for PP BTL , labs reviewed  All questions answered . Proceed

## 2020-12-02 NOTE — Anesthesia Postprocedure Evaluation (Signed)
Anesthesia Post Note  Patient: Freddi M Voit  Procedure(s) Performed: AN AD HOC LABOR EPIDURAL  Patient location during evaluation: Mother Baby Anesthesia Type: Epidural Level of consciousness: awake and alert Pain management: pain level controlled Vital Signs Assessment: post-procedure vital signs reviewed and stable Respiratory status: spontaneous breathing, nonlabored ventilation and respiratory function stable Cardiovascular status: stable Postop Assessment: no headache, no backache, patient able to bend at knees and able to ambulate Anesthetic complications: no   No notable events documented.   Last Vitals:  Vitals:   12/01/20 2040 12/01/20 2346  BP: 118/84 124/80  Pulse: 79 80  Resp: 20 20  Temp: 37 C 36.8 C  SpO2: 99% 98%    Last Pain:  Vitals:   12/01/20 2346  TempSrc: Oral  PainSc:                  Precious Haws Jacquetta Polhamus

## 2020-12-02 NOTE — Progress Notes (Signed)
Upon assessment pt complained of 6/10 pain. Toradol or motrin was offered, but pt denied either. K-pad was offered and she accepted. Will continue to monitor pt's pain.

## 2020-12-02 NOTE — Progress Notes (Signed)
Postpartum Day  1  Subjective: no complaints, up ad lib, voiding, and + flatus  Doing well, no concerns. Ambulating without difficulty, pain managed with PO meds, tolerating regular diet, and voiding without difficulty.   No fever/chills, chest pain, shortness of breath, nausea/vomiting, or leg pain. No nipple or breast pain. No headache, visual changes, or RUQ/epigastric pain.  Objective: BP 122/74 (BP Location: Left Arm)   Pulse 86   Temp 98 F (36.7 C) (Oral)   Resp 20   Ht 5\' 6"  (1.676 m)   Wt 62.6 kg   LMP 03/22/2020 (Exact Date)   SpO2 100%   Breastfeeding Unknown   BMI 22.27 kg/m    Physical Exam:  General: alert, cooperative, and no distress Breasts: soft/nontender CV: RRR Pulm: nl effort, CTABL Abdomen: soft, non-tender, active bowel sounds Uterine Fundus: firm Perineum: minimal edema, intact Lochia: appropriate DVT Evaluation: No evidence of DVT seen on physical exam.  Recent Labs    12/01/20 0052 12/02/20 0428  HGB 8.7* 8.4*  HCT 26.5* 25.6*  WBC 6.2 9.0  PLT 257 240    Assessment/Plan: 40 y.o. [id number] postpartum day # 1  -Continue routine postpartum care -Encouraged snug fitting bra, cold application, Tylenol PRN, and cabbage leaves for engorgement for formula feeding  -Desires postpartum BTL - scheduled for today with Dr. Ouida Sills  -Maternal iron deficiency anemia - hemodynamically stable and asymptomatic; start PO ferrous sulfate BID with stool softeners  -Immunization status:   all immunizations up to date   Disposition: Desires discharge home today   LOS: 1 day   Minda Meo, CNM 12/02/2020, 8:31 AM   ----- Drinda Butts  Certified Nurse Midwife [facility]

## 2020-12-02 NOTE — Anesthesia Postprocedure Evaluation (Signed)
Anesthesia Post Note  Patient: Alexandra Heath  Procedure(s) Performed: POST PARTUM TUBAL LIGATION  Patient location during evaluation: PACU Anesthesia Type: General Level of consciousness: awake and alert and oriented Pain management: pain level controlled Vital Signs Assessment: post-procedure vital signs reviewed and stable Respiratory status: spontaneous breathing, nonlabored ventilation and respiratory function stable Cardiovascular status: blood pressure returned to baseline and stable Postop Assessment: no signs of nausea or vomiting Anesthetic complications: no   No notable events documented.   Last Vitals:  Vitals:   12/02/20 1500 12/02/20 1515  BP: 124/77 113/73  Pulse: 84 72  Resp: 17 16  Temp:  36.7 C  SpO2: 99% 100%    Last Pain:  Vitals:   12/02/20 1520  TempSrc:   PainSc: 5                  Joi Leyva

## 2020-12-03 ENCOUNTER — Encounter: Payer: Self-pay | Admitting: Obstetrics and Gynecology

## 2020-12-03 DIAGNOSIS — Z9851 Tubal ligation status: Secondary | ICD-10-CM

## 2020-12-03 MED ORDER — OXYCODONE HCL 5 MG PO TABS: 5.0000 mg | ORAL_TABLET | ORAL | 0 refills | Status: DC | PRN

## 2020-12-03 MED ORDER — PROPRANOLOL HCL 20 MG/5ML PO SOLN: 20.0000 mg | Freq: Two times a day (BID) | ORAL | 12 refills | Status: AC

## 2020-12-03 NOTE — Progress Notes (Signed)
Pt discharged with infant.  Discharge instructions, prescriptions and follow up appointment given to and reviewed with pt. Pt verbalized understanding. Escorted out by auxillary. 

## 2020-12-03 NOTE — Progress Notes (Signed)
Upon assessment pt complains of 10/10 pain. Motrin or toradol, as well as PRN roxi was offered. Pt declined everything but one 5mg  tab of roxi, states that she "hates taking medicine." Patient will call out if she needs anything.

## 2020-12-03 NOTE — Op Note (Signed)
Alexandra Heath, BADER MEDICAL RECORD NO: [id number] ACCOUNT NO: [id number] DATE OF BIRTH: [birth year] FACILITY: [facility] LOCATION: [facility] PHYSICIAN: Boykin Nearing, MD  Operative Report   DATE OF PROCEDURE: 12/02/2020  PREOPERATIVE DIAGNOSIS:  Elective permanent sterilization.  POSTOPERATIVE DIAGNOSIS:  Elective permanent sterilization.  PROCEDURE:  Postpartum bilateral tubal ligation -- [place].  ANESTHESIA:  General endotracheal anesthesia.  SURGEON:  Boykin Nearing, MD  INDICATIONS:  A 40 year old female, postpartum day #1, has elected for permanent sterilization.  Medicaid consent form signed greater than 30 days prior.  The patient reconfirms her desire for permanent sterilization.  DESCRIPTION OF PROCEDURE:  After adequate general endotracheal anesthesia, the patient was placed in dorsal supine position.  The patient's abdomen was prepped and draped in normal sterile fashion.  Timeout was performed.  A 15 mm infraumbilical incision  was made after injecting with 0.5% Marcaine.  The fascia and the peritoneum was opened sharply without difficulty.  The right fallopian tube was identified and grasped with Babcock clamp and 2 separate 0 plain gut sutures were placed in the mid portion  of the fallopian tube and a 1.5 cm portion of fallopian tube was removed.  Good hemostasis was noted.  Similar procedure was repeated on the patient's left fallopian tube after identifying the fimbriated end.  Two separate 0 plain gut sutures were placed  in the mid portion of the fallopian tube and a 1.5 cm portion of fallopian tube was removed.  Good hemostasis noted.  Fascia was closed with 2-0 Vicryl suture and the skin was closed with interrupted 4-0 Vicryl suture.  There were no complications.  ESTIMATED BLOOD LOSS:  Minimal.  INTRAOPERATIVE FLUIDS:  300 mL.  The patient did receive 30 mg of intravenous Toradol at the end of the case.  The patient was taken to recovery room in good  condition.   PUS D: 12/02/2020 2:41:21 pm T: 12/03/2020 1:53:00 am  JOB: [id number]/ [id number]

## 2020-12-04 ENCOUNTER — Telehealth: Payer: Self-pay

## 2020-12-04 NOTE — Telephone Encounter (Signed)
Transition Care Management Follow-up Telephone Call Date of discharge and from where: 12/03/2020 from [facility] How have you been since you were released from the hospital? Pt stated that she is in some pain but she is feeling overall well. Pt does not have any questions or concerns at this time.  Any questions or concerns? No  Items Reviewed: Did the pt receive and understand the discharge instructions provided? Yes  Medications obtained and verified? Yes  Other? No  Any new allergies since your discharge? No  Dietary orders reviewed? No Do you have support at home? Yes   Functional Questionnaire: (I = Independent and D = Dependent) ADLs: I  Bathing/Dressing- I  Meal Prep- I  Eating- I  Maintaining continence- I  Transferring/Ambulation- I  Managing Meds- I   Follow up appointments reviewed:  PCP Hospital f/u appt confirmed? No   Specialist Hospital f/u appt confirmed? Yes  Scheduled to see Benjaman Kindler, MD on 12/07/2020 @ 10:45am. Are transportation arrangements needed? No  If their condition worsens, is the pt aware to call PCP or go to the Emergency Dept.? Yes Was the patient provided with contact information for the PCP's office or ED? Yes Was to pt encouraged to call back with questions or concerns? Yes

## 2020-12-06 LAB — SURGICAL PATHOLOGY

## 2020-12-16 ENCOUNTER — Other Ambulatory Visit: Payer: Medicaid Other

## 2021-01-05 ENCOUNTER — Emergency Department: Payer: Medicaid Other

## 2021-01-05 ENCOUNTER — Emergency Department
Admission: EM | Admit: 2021-01-05 | Discharge: 2021-01-05 | Disposition: A | Discharge status: Home / Self Care | Payer: Medicaid Other | Attending: Emergency Medicine | Admitting: Emergency Medicine

## 2021-01-05 DIAGNOSIS — R531 Weakness: Secondary | ICD-10-CM | POA: Diagnosis not present

## 2021-01-05 DIAGNOSIS — R201 Hypoesthesia of skin: Secondary | ICD-10-CM | POA: Diagnosis not present

## 2021-01-05 DIAGNOSIS — R42 Dizziness and giddiness: Secondary | ICD-10-CM | POA: Diagnosis not present

## 2021-01-05 DIAGNOSIS — R202 Paresthesia of skin: Secondary | ICD-10-CM | POA: Diagnosis not present

## 2021-01-05 DIAGNOSIS — R9431 Abnormal electrocardiogram [ECG] [EKG]: Secondary | ICD-10-CM | POA: Diagnosis not present

## 2021-01-05 DIAGNOSIS — R200 Anesthesia of skin: Secondary | ICD-10-CM | POA: Diagnosis not present

## 2021-01-05 DIAGNOSIS — H538 Other visual disturbances: Secondary | ICD-10-CM | POA: Diagnosis not present

## 2021-01-05 LAB — DIFFERENTIAL
Abs Immature Granulocytes: 0.03 10*3/uL (ref 0.00–0.07)
Basophils Absolute: 0 10*3/uL (ref 0.0–0.1)
Basophils Relative: 0 %
Eosinophils Absolute: 0.1 10*3/uL (ref 0.0–0.5)
Eosinophils Relative: 2 %
Immature Granulocytes: 1 %
Lymphocytes Relative: 28 %
Lymphs Abs: 1.4 10*3/uL (ref 0.7–4.0)
Monocytes Absolute: 0.4 10*3/uL (ref 0.1–1.0)
Monocytes Relative: 8 %
Neutro Abs: 3.1 10*3/uL (ref 1.7–7.7)
Neutrophils Relative %: 61 %

## 2021-01-05 LAB — PROTIME-INR
INR: 1.1 (ref 0.8–1.2)
Prothrombin Time: 14.1 seconds (ref 11.4–15.2)

## 2021-01-05 LAB — CBC
HCT: 35.6 % — ABNORMAL LOW (ref 36.0–46.0)
Hemoglobin: 11.3 g/dL — ABNORMAL LOW (ref 12.0–15.0)
MCH: 26.5 pg (ref 26.0–34.0)
MCHC: 31.7 g/dL (ref 30.0–36.0)
MCV: 83.6 fL (ref 80.0–100.0)
Platelets: 335 10*3/uL (ref 150–400)
RBC: 4.26 MIL/uL (ref 3.87–5.11)
RDW: 15.3 % (ref 11.5–15.5)
WBC: 5.1 10*3/uL (ref 4.0–10.5)
nRBC: 0 % (ref 0.0–0.2)

## 2021-01-05 LAB — COMPREHENSIVE METABOLIC PANEL
ALT: 13 U/L (ref 0–44)
AST: 17 U/L (ref 15–41)
Albumin: 4 g/dL (ref 3.5–5.0)
Alkaline Phosphatase: 82 U/L (ref 38–126)
Anion gap: 7 (ref 5–15)
BUN: 17 mg/dL (ref 6–20)
CO2: 26 mmol/L (ref 22–32)
Calcium: 9.3 mg/dL (ref 8.9–10.3)
Chloride: 102 mmol/L (ref 98–111)
Creatinine, Ser: 1.26 mg/dL — ABNORMAL HIGH (ref 0.44–1.00)
GFR, Estimated: 55 mL/min — ABNORMAL LOW (ref >60)
Glucose, Bld: 104 mg/dL — ABNORMAL HIGH (ref 70–99)
Potassium: 4.3 mmol/L (ref 3.5–5.1)
Sodium: 135 mmol/L (ref 135–145)
Total Bilirubin: 0.5 mg/dL (ref 0.3–1.2)
Total Protein: 7.8 g/dL (ref 6.5–8.1)

## 2021-01-05 LAB — TROPONIN I (HIGH SENSITIVITY): Troponin I (High Sensitivity): 3 ng/L (ref <18)

## 2021-01-05 LAB — HCG, QUANTITATIVE, PREGNANCY: hCG, Beta Chain, Quant, S: 2 m[IU]/mL (ref <5)

## 2021-01-05 LAB — APTT: aPTT: 26 seconds (ref 24–36)

## 2021-01-05 MED ORDER — MECLIZINE HCL 25 MG PO TABS: 25.0000 mg | ORAL_TABLET | Freq: Three times a day (TID) | ORAL | 0 refills | Status: AC | PRN

## 2021-01-05 MED ORDER — GADOBUTROL 1 MMOL/ML IV SOLN
6.0000 mL | Freq: Once | INTRAVENOUS | Status: AC | PRN
  Administered 2021-01-05: 6 mL via INTRAVENOUS
  Filled 2021-01-05: qty 6

## 2021-01-05 MED ORDER — LORAZEPAM 2 MG/ML IJ SOLN
0.5000 mg | Freq: Once | INTRAMUSCULAR | Status: AC
  Administered 2021-01-05: 0.5 mg via INTRAVENOUS
  Filled 2021-01-05: qty 1

## 2021-01-05 NOTE — ED Provider Notes (Signed)
Emergency Medicine Provider Triage Evaluation Note  Alexandra Heath , a 40 y.o. female  was evaluated in triage.  Pt complains of tingling to the left side of her face started about an hour prior to arrival.  Patient presents via EMS from home, with a negative stroke screen, symptoms currently resolved.  She does give a history of vertigo and complex migraines.  She describes feeling a sensation as if she is unstable when she looks side to side or up and down.  She denies any head injury, chest pain, or shortness of breath.  Review of Systems  Positive: Left face numbness, visual disturbance Negative: Head injury, LOC  Physical Exam  BP 125/80 (BP Location: Left Arm)   Pulse 86   Temp 98.7 F (37.1 C) (Oral)   Resp 18   Ht 5\' 6"  (1.676 m)   Wt 59 kg   SpO2 99%   BMI 20.98 kg/m  Gen:   Awake, no distress NAD Resp:  Normal effort CTA MSK:   Moves extremities without difficulty normal fist and symmetric movement Other:  CVS: RRR  Medical Decision Making  Medically screening exam initiated at 12:16 PM.  Appropriate orders placed.  Alexandra Heath was informed that the remainder of the evaluation will be completed by another provider, this initial triage assessment does not replace that evaluation, and the importance of remaining in the ED until their evaluation is complete.  Patient ED evaluation now resolved left face tingling as well as some dizziness when she looks around with her eyes.   Melvenia Needles, PA-C 01/05/21 1217    Vladimir Crofts, MD 01/05/21 [phone]

## 2021-01-05 NOTE — ED Triage Notes (Signed)
Pt in via EMS from home with c/o tingling to the left side of her face that started 1 hour ago. Stroke screen negative. Pt with hx of vertigo and complex migraines. Pt with questionable focal seizures.

## 2021-01-05 NOTE — ED Notes (Signed)
Pt to MRI with tech

## 2021-01-05 NOTE — Discharge Instructions (Addendum)
Your MRIs were negative.  Your kidney function slightly elevated which can be a sign of some mild dehydration so make sure to drink plenty of fluids.  We have started you on some meclizine to help with vertigo.  Do not use if you decide to continue breastfeeding. Please call your neurologist to further discuss what happened.  Please make a follow-up appointment as soon as available.  Return to the ER for return or worsening symptoms.

## 2021-01-05 NOTE — ED Provider Notes (Signed)
[facility] Emergency Department Provider Note  ____________________________________________   Event Date/Time   First MD Initiated Contact with Patient 01/05/21 1445     (approximate)  I have reviewed the triage vital signs and the nursing notes.   HISTORY  Chief Complaint Numbness (Left side of face and eye )    HPI Alexandra Heath is a 40 y.o. female with epilepsy on Lamictal who comes in with concerns for left-sided numbness on her face.  Patient states that she woke up and felt fine and then at 930 she had sudden onset of left-sided facial tingling as well as blurred vision in the left eye that was constant, gradually got better after few hours, mostly relieved after 2-1/2 hours, unclear what brought it on.  She denies this ever happening previously.  She denies any headaches or her vision going completely black.  Denies any chest pain, shortness of breath.  Reports a little bit of dizziness when she moves her head but states that she is mostly resolved.  Patient did give birth a month ago. Patient reports taking Lamictal and been on an increasing dose due to the recent pregnancy.  She reports currently being on 400 mg twice daily.  She is followed by neurology for this as well as complex migraines.       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   Migraines 06/12/2017   Numbness 04/06/2015   Palpitations 12/17/2014   Scoliosis  12/17/2014   Leg weakness 12/17/2014   Depression with anxiety 12/17/2014   Insomnia 12/17/2014   Polyneuropathy 09/10/2014   Back muscle spasm 06/15/2014   Idiopathic scoliosis 06/15/2014    Past Surgical History:  Procedure Laterality Date   CHOLECYSTECTOMY     GALLBLADDER SURGERY     TUBAL LIGATION N/A 12/02/2020   Procedure: POST PARTUM TUBAL LIGATION;  Surgeon: Boykin Nearing, MD;  Location: [facility] ORS;  Service: Gynecology;  Laterality: N/A;    Prior to Admission medications   Medication Sig Start Date End Date Taking? Authorizing Provider  ferrous sulfate 325 (65 FE) MG tablet Take 325 mg by mouth daily with breakfast.    [provider]  lamoTRIgine 100 MG TBDP Take 3 tablets by mouth 2 (two) times daily. 03/16/19   [provider]  oxyCODONE (OXY IR/ROXICODONE) 5 MG immediate release tablet Take 1-2 tablets (5-10 mg total) by mouth every 4 (four) hours as needed for moderate pain or severe pain. 12/03/20   Linda Hedges, CNM  propranolol (INDERAL) 20 MG/5ML solution Take 5 mLs (20 mg total) by mouth 2 (two) times daily. 12/03/20   Linda Hedges, CNM  medroxyPROGESTERone (DEPO-PROVERA) 150 MG/ML injection Inject 150 mg into the muscle every 3 (three) months. 01/31/18 04/01/19  [provider]  omeprazole (PRILOSEC) 20 MG capsule Take 20 mg by mouth daily as needed.  04/01/19  [provider]  SUMAtriptan (IMITREX) 50 MG tablet Take 50 mg by mouth every 2 (two) hours  as needed for migraine. May repeat in 2 hours if headache persists or recurs.  04/01/19  [provider]    Allergies Patient has no known allergies.  Family History  Problem Relation Age of Onset   HIV Mother    Anemia Mother    Migraines Mother    Depression Mother    Hypertension Father    Diabetes Father    Hypertension Maternal Grandmother    Breast cancer Neg Hx     Social History Social History   Tobacco Use   Smoking status: Never    Smokeless tobacco: Never  Vaping Use   Vaping Use: Never used  Substance Use Topics   Alcohol use: No   Drug use: No      Review of Systems Constitutional: No fever/chills Eyes: No visual changes. ENT: No sore throat. Cardiovascular: Denies chest pain. Respiratory: Denies shortness of breath. Gastrointestinal: No abdominal pain.  No nausea, no vomiting.  No diarrhea.  No constipation. Genitourinary: Negative for dysuria. Musculoskeletal: Negative for back pain. Skin: Negative for rash. Neurological: Left-sided vision changes, facial numbness All other ROS negative ____________________________________________   PHYSICAL EXAM:  VITAL SIGNS: ED Triage Vitals  Enc Vitals Group     BP 01/05/21 1208 125/80     Pulse Rate 01/05/21 1208 86     Resp 01/05/21 1208 18     Temp 01/05/21 1208 98.7 F (37.1 C)     Temp Source 01/05/21 1208 Oral     SpO2 01/05/21 1208 99 %     Weight 01/05/21 1214 130 lb (59 kg)     Height 01/05/21 1214 5\' 6"  (1.676 m)     Head Circumference --      Peak Flow --      Pain Score --      Pain Loc --      Pain Edu? --      Excl. in [place]? --     Constitutional: Alert and oriented. Well appearing and in no acute distress. Eyes: Conjunctivae are normal. EOMI. pupils reactive bilaterally.  Vision intact bilaterally at baseline.  Peripheral vision intact Head: Atraumatic. Nose: No congestion/rhinnorhea. Mouth/Throat: Mucous membranes are moist.   Neck: No stridor. Trachea Midline. FROM Cardiovascular: Normal rate, regular rhythm. Grossly normal heart sounds.  Good peripheral circulation. Respiratory: Normal respiratory effort.  No retractions. Lungs CTAB. Gastrointestinal: Soft and nontender. No distention. No abdominal bruits.  Musculoskeletal: No lower extremity tenderness nor edema.  No joint effusions. Neurologic:  Normal speech and language. No gross focal neurologic deficits are appreciated.  CN nerves are intact, equal strength in arms and  legs Skin:  Skin is warm, dry and intact. No rash noted. Psychiatric: Mood and affect are normal. Speech and behavior are normal. GU: Deferred   ____________________________________________   LABS (all labs ordered are listed, but only abnormal results are displayed)  Labs Reviewed  CBC - Abnormal; Notable for the following components:      Result Value   Hemoglobin 11.3 (*)    HCT 35.6 (*)    All other components within normal limits  COMPREHENSIVE METABOLIC PANEL - Abnormal; Notable for the following components:   Glucose, Bld 104 (*)    Creatinine, Ser 1.26 (*)    GFR, Estimated 55 (*)    All other components within normal limits  PROTIME-INR  APTT  DIFFERENTIAL  I-STAT CREATININE, ED  CBG MONITORING, ED  POC URINE PREG, ED   ____________________________________________   ED ECG REPORT I, Royetta Crochet  Jari Pigg, the attending physician, personally viewed and interpreted this ECG.  Normal sinus rate 75, no ST elevation, no T wave inversion, normal intervals ____________________________________________  RADIOLOGY Robert Bellow, personally viewed and evaluated these images (plain radiographs) as part of my medical decision making, as well as reviewing the written report by the radiologist.  ED MD interpretation: No intracranial hemorrhage  Official radiology report(s): CT HEAD WO CONTRAST  Result Date: 01/05/2021 CLINICAL DATA:  Left-sided facial tingling. EXAM: CT HEAD WITHOUT CONTRAST TECHNIQUE: Contiguous axial images were obtained from the base of the skull through the vertex without intravenous contrast. COMPARISON:  MRI brain dated October 18, 2016. FINDINGS: Brain: No evidence of acute infarction, hemorrhage, hydrocephalus, extra-axial collection or mass lesion/mass effect. Vascular: No hyperdense vessel or unexpected calcification. Skull: Normal. Negative for fracture or focal lesion. Sinuses/Orbits: No acute finding. Other: None. IMPRESSION: 1. Normal noncontrast head  CT. Electronically Signed   By: Titus Dubin M.D.   On: 01/05/2021 12:51   MR Brain W and Wo Contrast  Result Date: 01/05/2021 CLINICAL DATA:  Neuro deficit, acute, stroke suspected. Dural venous sinus thrombosis suspected. Left-sided facial numbness and blurred vision. Recently postpartum. EXAM: MRI HEAD WITHOUT AND WITH CONTRAST MRV HEAD WITHOUT CONTRAST TECHNIQUE: Multiplanar, multiecho pulse sequences of the brain and surrounding structures were obtained without and with intravenous contrast. Angiographic images of the intracranial venous structures were obtained using MRV technique without intravenous contrast. CONTRAST:  23mL GADAVIST GADOBUTROL 1 MMOL/ML IV SOLN COMPARISON:  Head CT 01/05/2021 and MRI 10/18/2016 FINDINGS: HEAD MRI: Brain: There is no evidence of an acute infarct, intracranial hemorrhage, mass, midline shift, or extra-axial fluid collection. The ventricles and sulci are normal. No significant white matter disease is seen. No abnormal enhancement is identified. Vascular: Major intracranial vascular flow voids are preserved. Skull and upper cervical spine: Unremarkable bone marrow signal para Sinuses/Orbits: Unremarkable orbits. Paranasal sinuses and mastoid air cells are clear. Other: None. HEAD MRV: The superior sagittal sinus, internal cerebral veins, vein of Galen, straight sinus, transverse sinuses, sigmoid sinuses, and jugular bulbs are patent without evidence of thrombus or significant stenosis. IMPRESSION: Negative head MRI and MRV. Electronically Signed   By: Logan Bores M.D.   On: 01/05/2021 17:57   MR MRV HEAD W WO CONTRAST  Result Date: 01/05/2021 CLINICAL DATA:  Neuro deficit, acute, stroke suspected. Dural venous sinus thrombosis suspected. Left-sided facial numbness and blurred vision. Recently postpartum. EXAM: MRI HEAD WITHOUT AND WITH CONTRAST MRV HEAD WITHOUT CONTRAST TECHNIQUE: Multiplanar, multiecho pulse sequences of the brain and surrounding structures were  obtained without and with intravenous contrast. Angiographic images of the intracranial venous structures were obtained using MRV technique without intravenous contrast. CONTRAST:  50mL GADAVIST GADOBUTROL 1 MMOL/ML IV SOLN COMPARISON:  Head CT 01/05/2021 and MRI 10/18/2016 FINDINGS: HEAD MRI: Brain: There is no evidence of an acute infarct, intracranial hemorrhage, mass, midline shift, or extra-axial fluid collection. The ventricles and sulci are normal. No significant white matter disease is seen. No abnormal enhancement is identified. Vascular: Major intracranial vascular flow voids are preserved. Skull and upper cervical spine: Unremarkable bone marrow signal para Sinuses/Orbits: Unremarkable orbits. Paranasal sinuses and mastoid air cells are clear. Other: None. HEAD MRV: The superior sagittal sinus, internal cerebral veins, vein of Galen, straight sinus, transverse sinuses, sigmoid sinuses, and jugular bulbs are patent without evidence of thrombus or significant stenosis. IMPRESSION: Negative head MRI and MRV. Electronically Signed   By: Logan Bores M.D.   On: 01/05/2021 17:57  ____________________________________________   PROCEDURES  Procedure(s) performed (including Critical Care):  Procedures   ____________________________________________   INITIAL IMPRESSION / ASSESSMENT AND PLAN / ED COURSE  Alexandra Heath was evaluated in Emergency Department on 01/05/2021 for the symptoms described in the history of present illness. She was evaluated in the context of the global COVID-19 pandemic, which necessitated consideration that the patient might be at risk for infection with the SARS-CoV-2 virus that causes COVID-19. Institutional protocols and algorithms that pertain to the evaluation of patients at risk for COVID-19 are in a state of rapid change based on information released by regulatory bodies including the CDC and federal and state organizations. These policies and algorithms were  followed during the patient's care in the ED.     Patient comes in with left-sided facial tingling and left vision changes.  Vision never went black to suggest a central retinal artery occlusion however I am concerned about the possibly of stroke, MS, venous thrombosis.  If MRIs are negative this could be a complex migraine although she denied any headaches versus from her epilepsy and she can follow-up with neurology.  MRI was not negative, MRV was negative  Labs are all reassuring.  Patient is not pregnant.  Hemoglobin is stable.  Kidney function is slightly elevated and encourage some oral hydration, no evidence of ACS  On repeat evaluation patient states that she is still feeling well without any return of symptoms she feels comfortable with discharge home and follow-up with her neurologist.  Her Lamictal level is pending and she can use this to help decide what to do with her future Lamictal doses since they are continue to adjust them for her seizures.  She denies any recent seizure.  Given she still has a little bit of dizziness with turning her head she could have some mild vertigo so we will start her on some meclizine.  She reports that she currently is going to stop breast-feeding.  Explained that she should not use the meclizine if she is going to continue breast-feeding but states she is planning to stop for other reasons anyways.  She feels comfortable with plan and will be discharged      ____________________________________________   FINAL CLINICAL IMPRESSION(S) / ED DIAGNOSES   Final diagnoses:  Paresthesias  Vertigo      MEDICATIONS GIVEN DURING THIS VISIT:  Medications  LORazepam (ATIVAN) injection 0.5 mg (0.5 mg Intravenous Given 01/05/21 1643)  gadobutrol (GADAVIST) 1 MMOL/ML injection 6 mL (6 mLs Intravenous Contrast Given 01/05/21 1733)     ED Discharge Orders     None        Note:  This document was prepared using Dragon voice recognition software  and may include unintentional dictation errors.    Vanessa [place], MD 01/05/21 [phone]

## 2021-01-05 NOTE — ED Triage Notes (Signed)
Pt to ED via [facility] from home. Pt stating 1hr PTA pt started experiencing dizziness and left side of face and eye started tingling and went numb. Pt with hx vertigo and migraines. Pt stating she was also told she may have seizures. Pt not currently on any medication for vertigo or seizures. Pt A&Ox4 Grips equal and sensation intact. No facial droop noted.

## 2021-01-06 LAB — LAMOTRIGINE LEVEL: Lamotrigine Lvl: 19 ug/mL (ref 2.0–20.0)

## 2021-01-07 ENCOUNTER — Telehealth: Payer: Self-pay

## 2021-01-07 NOTE — Telephone Encounter (Signed)
Transition Care Management Unsuccessful Follow-up Telephone Call  Date of discharge and from where:  01/05/2021-[facility]  Attempts:  1st Attempt  Reason for unsuccessful TCM follow-up call:  Left voice message

## 2021-01-10 NOTE — Telephone Encounter (Signed)
Transition Care Management Unsuccessful Follow-up Telephone Call  Date of discharge and from where:  01/05/2021-[facility]  Attempts:  2nd Attempt  Reason for unsuccessful TCM follow-up call:  Left voice message

## 2021-01-11 NOTE — Telephone Encounter (Signed)
Transition Care Management Follow-up Telephone Call Date of discharge and from where: 01/05/2021 from [facility] How have you been since you were released from the hospital? Pt stated that she is doing well today and did not have any questions or concerns at this time. Pt informed of how to change PCP with her managed medicaid payor.  Any questions or concerns? No  Items Reviewed: Did the pt receive and understand the discharge instructions provided? Yes  Medications obtained and verified? Yes  Other? No  Any new allergies since your discharge? No  Dietary orders reviewed? No Do you have support at home? Yes   Functional Questionnaire: (I = Independent and D = Dependent) ADLs: I  Bathing/Dressing- I  Meal Prep- I  Eating- I  Maintaining continence- I  Transferring/Ambulation- I  Managing Meds- I   Follow up appointments reviewed:  PCP Hospital f/u appt confirmed? Yes  Scheduled to see Fulton Reek, MD on 01/14/2021 @ 10:30am. [facility] f/u appt confirmed? No   Are transportation arrangements needed? No  If their condition worsens, is the pt aware to call PCP or go to the Emergency Dept.? Yes Was the patient provided with contact information for the PCP's office or ED? Yes Was to pt encouraged to call back with questions or concerns? Yes

## 2021-01-14 DIAGNOSIS — G40309 Generalized idiopathic epilepsy and epileptic syndromes, not intractable, without status epilepticus: Secondary | ICD-10-CM | POA: Diagnosis not present

## 2021-01-20 DIAGNOSIS — G43019 Migraine without aura, intractable, without status migrainosus: Secondary | ICD-10-CM | POA: Diagnosis not present

## 2021-01-20 DIAGNOSIS — G40309 Generalized idiopathic epilepsy and epileptic syndromes, not intractable, without status epilepticus: Secondary | ICD-10-CM | POA: Diagnosis not present

## 2021-01-20 DIAGNOSIS — R202 Paresthesia of skin: Secondary | ICD-10-CM | POA: Diagnosis not present

## 2021-03-20 ENCOUNTER — Ambulatory Visit
Admission: EM | Admit: 2021-03-20 | Discharge: 2021-03-20 | Disposition: A | Discharge status: Home / Self Care | Payer: Medicaid Other | Attending: Emergency Medicine | Admitting: Emergency Medicine

## 2021-03-20 ENCOUNTER — Ambulatory Visit (INDEPENDENT_AMBULATORY_CARE_PROVIDER_SITE_OTHER): Payer: Medicaid Other

## 2021-03-20 ENCOUNTER — Other Ambulatory Visit: Payer: Self-pay

## 2021-03-20 DIAGNOSIS — M25521 Pain in right elbow: Secondary | ICD-10-CM

## 2021-03-20 MED ORDER — CYCLOBENZAPRINE HCL 5 MG PO TABS: 5.0000 mg | ORAL_TABLET | Freq: Every day | ORAL | 0 refills | Status: DC

## 2021-03-20 MED ORDER — NAPROXEN 250 MG PO TABS: 500.0000 mg | ORAL_TABLET | Freq: Two times a day (BID) | ORAL | 0 refills | Status: DC

## 2021-03-20 NOTE — Discharge Instructions (Signed)
Your pain is most likely caused by irritation to the muscles, tendons or ligaments.   X-rays negative for injury to the bone  Take naproxen twice a day for the next 5 days, this medication is used to reduce irritation and inflammation  You may use muscle relaxer in addition at bedtime this medication may make you drowsy  You may use heating pad in 15 minute intervals as needed for additional comfort, or you may find comfort in using ice in 10-15 minutes over affected area  Begin stretching affected area daily for 10 minutes as tolerated to further loosen muscles   When lying down place pillow underneath arm for support  If pain persist after recommended treatment or reoccurs if may be beneficial to follow up with orthopedic specialist for evaluation, this doctor specializes in the bones and can manage your symptoms long-term with options such as but not limited to imaging, medications or physical therapy

## 2021-03-20 NOTE — ED Provider Notes (Signed)
[facility]    CSN: [id number] Arrival date & time: 03/20/21  3546      History   Chief Complaint Chief Complaint  Patient presents with   Elbow Pain    HPI Alexandra Heath is a 41 y.o. female.   Patient presents with right anterior elbow pain for 1 week.  Patient is constant described as a burning sensation.  Pain does not radiate.  It is worsened by all movement.  Has not attempted treatment.  Denies injury or trauma but does endorse she has 2 small children at home which require a lot of lifting.  Denies numbness, tingling.  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/18/2018   Migraines 06/12/2017   Numbness 04/06/2015   Palpitations 12/17/2014   Scoliosis 12/17/2014   Leg weakness 12/17/2014   Depression with anxiety 12/17/2014   Insomnia 12/17/2014   Polyneuropathy 09/10/2014   Back muscle spasm 06/15/2014   Idiopathic scoliosis 06/15/2014    Past Surgical History:  Procedure Laterality Date   CHOLECYSTECTOMY     GALLBLADDER SURGERY     TUBAL LIGATION N/A 12/02/2020   Procedure: POST PARTUM TUBAL LIGATION;  Surgeon: Boykin Nearing, MD;  Location: [facility] ORS;  Service: Gynecology;  Laterality: N/A;    OB History     Gravida  7   Para  5   Term  5   Preterm  0   AB  2   Living  5      SAB  1   IAB  1   Ectopic      Multiple  0   Live Births  5            Home Medications    Prior to Admission medications    Medication Sig Start Date End Date Taking? Authorizing Provider  ferrous sulfate 325 (65 FE) MG tablet Take 325 mg by mouth daily with breakfast.   Yes [provider]  lamoTRIgine 100 MG TBDP Take 3 tablets by mouth 2 (two) times daily. 03/16/19  Yes [provider]  meclizine (ANTIVERT) 25 MG tablet Take 1 tablet (25 mg total) by mouth 3 (three) times daily as needed for dizziness. 01/05/21  Yes Vanessa [place], MD  propranolol (INDERAL) 20 MG/5ML solution Take 5 mLs (20 mg total) by mouth 2 (two) times daily. 12/03/20  Yes Linda Hedges, CNM  oxyCODONE (OXY IR/ROXICODONE) 5 MG immediate release tablet Take 1-2 tablets (5-10 mg total) by mouth every 4 (four) hours as needed for moderate pain or severe pain. 12/03/20   Linda Hedges, CNM  medroxyPROGESTERone (DEPO-PROVERA) 150 MG/ML injection Inject 150 mg into the muscle every 3 (three) months. 01/31/18 04/01/19  [provider]  omeprazole (PRILOSEC) 20 MG capsule Take 20 mg by mouth daily as needed.  04/01/19  [provider]  SUMAtriptan (IMITREX) 50 MG tablet Take 50 mg by mouth every 2 (two) hours as needed for migraine. May repeat in 2 hours if  headache persists or recurs.  04/01/19  [provider]    Family History Family History  Problem Relation Age of Onset   HIV Mother    Anemia Mother    Migraines Mother    Depression Mother    Hypertension Father    Diabetes Father    Hypertension Maternal Grandmother    Breast cancer Neg Hx     Social History Social History   Tobacco Use   Smoking status: Never   Smokeless tobacco: Never  Vaping Use   Vaping Use: Never used  Substance Use Topics   Alcohol use: No   Drug use: No     Allergies   Patient has no known allergies.   Review of Systems Review of Systems  Constitutional: Negative.   Respiratory: Negative.    Cardiovascular: Negative.   Skin: Negative.   Neurological: Negative.     Physical Exam Triage Vital  Signs ED Triage Vitals  Enc Vitals Group     BP 03/20/21 0940 109/73     Pulse Rate 03/20/21 0940 80     Resp 03/20/21 0940 18     Temp 03/20/21 0940 98.5 F (36.9 C)     Temp Source 03/20/21 0940 Oral     SpO2 03/20/21 0940 98 %     Weight 03/20/21 0938 142 lb (64.4 kg)     Height 03/20/21 0938 5\' 6"  (1.676 m)     Head Circumference --      Peak Flow --      Pain Score 03/20/21 0938 9     Pain Loc --      Pain Edu? --      Excl. in [place]? --    No data found.  Updated Vital Signs BP 109/73 (BP Location: Left Arm)    Pulse 80    Temp 98.5 F (36.9 C) (Oral)    Resp 18    Ht 5\' 6"  (1.676 m)    Wt 142 lb (64.4 kg)    LMP 02/13/2021    SpO2 98%    BMI 22.92 kg/m   Visual Acuity Right Eye Distance:   Left Eye Distance:   Bilateral Distance:    Right Eye Near:   Left Eye Near:    Bilateral Near:     Physical Exam Constitutional:      Appearance: Normal appearance.  HENT:     Head: Normocephalic.  Eyes:     Extraocular Movements: Extraocular movements intact.  Pulmonary:     Effort: Pulmonary effort is normal.  Musculoskeletal:     Comments: Nodule present on the posterior lateral aspect of the humerus, point tenderness noted over nodule, nodule is firm and does not fluctuate, no tenderness over the epicondyle,, no swelling, ecchymosis, deformity noted, range of motion intact, 2+ brachial pulse  Skin:    General: Skin is warm and dry.  Neurological:     Mental Status: She is alert and oriented to person, place, and time. Mental status is at baseline.  Psychiatric:        Mood and Affect: Mood normal.        Behavior: Behavior normal.     UC Treatments / Results  Labs (all labs ordered are listed, but only abnormal results are displayed) Labs Reviewed - No data to display  EKG   Radiology No results found.  Procedures Procedures (including critical care time)  Medications Ordered in UC Medications - No data to display  Initial Impression / Assessment and  Plan / UC Course  I have reviewed the triage vital signs and the nursing notes.  Pertinent labs & imaging results that were available during my care of the patient were reviewed by me and considered in my medical decision making (see chart for details).  Right elbow pain  Elbow x-ray negative, discussed findings with patient, etiology of symptoms most likely irritation to the tendon or ligament, naproxen 500 mg twice daily for 5 days recommended as well as Flexeril 5 mg at bedtime as needed in addition, recommended heat or ice in 15-minute intervals, Ace wrap for support, daily stretching, pillows for support, recommended follow-up with orthopedic specialist if symptoms persist, walking referral given Final Clinical Impressions(s) / UC Diagnoses   Final diagnoses:  None   Discharge Instructions   None    ED Prescriptions   None    PDMP not reviewed this encounter.   Hans Eden, NP 03/20/21 1039

## 2021-03-20 NOTE — ED Triage Notes (Signed)
Pt c/o right elbow pain x1week. Pt states that it hurts when moving her arm. Pt States that she can not extend her arm and that it is tender to touch.

## 2021-04-05 DIAGNOSIS — M7711 Lateral epicondylitis, right elbow: Secondary | ICD-10-CM | POA: Diagnosis not present

## 2021-05-12 ENCOUNTER — Other Ambulatory Visit: Payer: Self-pay | Admitting: Internal Medicine

## 2021-05-12 DIAGNOSIS — Z1231 Encounter for screening mammogram for malignant neoplasm of breast: Secondary | ICD-10-CM

## 2021-06-27 ENCOUNTER — Ambulatory Visit
Admission: RE | Admit: 2021-06-27 | Discharge: 2021-06-27 | Disposition: A | Discharge status: Home / Self Care | Payer: Medicaid Other | Source: Ambulatory Visit | Attending: Internal Medicine | Admitting: Internal Medicine

## 2021-06-27 DIAGNOSIS — Z1231 Encounter for screening mammogram for malignant neoplasm of breast: Secondary | ICD-10-CM | POA: Diagnosis not present

## 2021-06-29 DIAGNOSIS — G40309 Generalized idiopathic epilepsy and epileptic syndromes, not intractable, without status epilepticus: Secondary | ICD-10-CM | POA: Diagnosis not present

## 2021-06-29 DIAGNOSIS — M79601 Pain in right arm: Secondary | ICD-10-CM | POA: Diagnosis not present

## 2021-07-17 ENCOUNTER — Other Ambulatory Visit: Payer: Self-pay

## 2021-07-17 ENCOUNTER — Ambulatory Visit
Admission: EM | Admit: 2021-07-17 | Discharge: 2021-07-17 | Disposition: A | Discharge status: Home / Self Care | Payer: Medicaid Other | Attending: Emergency Medicine | Admitting: Emergency Medicine

## 2021-07-17 DIAGNOSIS — J069 Acute upper respiratory infection, unspecified: Secondary | ICD-10-CM | POA: Diagnosis not present

## 2021-07-17 LAB — GROUP A STREP BY PCR: Group A Strep by PCR: NOT DETECTED

## 2021-07-17 MED ORDER — PROMETHAZINE-DM 6.25-15 MG/5ML PO SYRP: 5.0000 mL | ORAL_SOLUTION | Freq: Four times a day (QID) | ORAL | 0 refills | Status: AC | PRN

## 2021-07-17 MED ORDER — IPRATROPIUM BROMIDE 0.06 % NA SOLN: 2.0000 | Freq: Four times a day (QID) | NASAL | 12 refills | Status: AC

## 2021-07-17 MED ORDER — BENZONATATE 100 MG PO CAPS: 200.0000 mg | ORAL_CAPSULE | Freq: Three times a day (TID) | ORAL | 0 refills | Status: DC

## 2021-07-17 NOTE — ED Triage Notes (Signed)
Patient presents to [facility] for a sore throat -- Started Thursday.  Patient doer shave a cough and denies fevers.

## 2021-07-17 NOTE — ED Provider Notes (Signed)
[facility]    CSN: [id number] Arrival date & time: 07/17/21  0800      History   Chief Complaint No chief complaint on file.   HPI Alexandra Heath is a 41 y.o. female.   HPI  41 year old female here for evaluation of respiratory symptoms.  Patient reports that for the last 3 days she has been experiencing a sore throat and a nonproductive cough.  Last night she reports that she had significant pain in both ears but that has resolved this morning.  She has not had any associated fever, denies runny nose nasal congestion, no shortness breath or wheezing, and she denies headache.  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[place]) 09/18/2018   Migraines 06/12/2017   Numbness 04/06/2015   Palpitations 12/17/2014   Scoliosis 12/17/2014   Leg weakness 12/17/2014   Depression with anxiety 12/17/2014   Insomnia 12/17/2014   Polyneuropathy 09/10/2014   Back muscle spasm 06/15/2014   Idiopathic scoliosis 06/15/2014    Past Surgical History:  Procedure Laterality Date   CHOLECYSTECTOMY     GALLBLADDER SURGERY     TUBAL LIGATION N/A 12/02/2020   Procedure: POST PARTUM TUBAL LIGATION;  Surgeon: Boykin Nearing, MD;  Location: [facility] ORS;  Service: Gynecology;  Laterality: N/A;    OB History     Gravida  7   Para  5   Term  5   Preterm  0   AB  2   Living  5      SAB  1   IAB  1   Ectopic      Multiple  0   Live Births  5            Home  Medications    Prior to Admission medications   Medication Sig Start Date End Date Taking? Authorizing Provider  benzonatate (TESSALON) 100 MG capsule Take 2 capsules (200 mg total) by mouth every 8 (eight) hours. 07/17/21  Yes Margarette [place], NP  cyclobenzaprine (FLEXERIL) 5 MG tablet Take 1 tablet (5 mg total) by mouth at bedtime. 03/20/21  Yes White, Adrienne R, NP  ferrous sulfate 325 (65 FE) MG tablet Take 325 mg by mouth daily with breakfast.   Yes [provider]  ipratropium (ATROVENT) 0.06 % nasal spray Place 2 sprays into both nostrils 4 (four) times daily. 07/17/21  Yes Margarette Canada, NP  lamoTRIgine 100 MG TBDP Take 3 tablets by mouth 2 (two) times daily. 03/16/19  Yes [provider]  meclizine (ANTIVERT) 25 MG tablet Take 1 tablet (25 mg total) by mouth 3 (three) times daily as needed for dizziness. 01/05/21  Yes Vanessa [place], MD  naproxen (NAPROSYN) 250 MG tablet Take 2 tablets (500 mg total) by mouth 2 (two) times daily with a meal. 03/20/21  Yes White, Adrienne R, NP  promethazine-dextromethorphan (PROMETHAZINE-DM) 6.25-15 MG/5ML syrup Take 5 mLs by mouth 4 (four) times daily as needed. 07/17/21  Yes Margarette [place], NP  propranolol (INDERAL) 20 MG/5ML solution Take 5 mLs (20 mg total) by mouth 2 (two) times daily. 12/03/20  Yes Linda Hedges, CNM  medroxyPROGESTERone (DEPO-PROVERA) 150 MG/ML injection Inject 150 mg into the muscle every 3 (three) months. 01/31/18 04/01/19  [provider]  omeprazole (PRILOSEC) 20 MG capsule Take 20 mg by mouth daily as needed.  04/01/19  [provider]  SUMAtriptan (IMITREX) 50 MG tablet Take 50 mg by mouth every 2 (two) hours as needed for migraine. May repeat in 2 hours if headache persists or recurs.  04/01/19  [provider]    Family History Family History  Problem Relation Age of Onset   HIV Mother    Anemia Mother    Migraines Mother    Depression Mother    Hypertension Father    Diabetes Father     Hypertension Maternal Grandmother    Breast cancer Neg Hx     Social History Social History   Tobacco Use   Smoking status: Never   Smokeless tobacco: Never  Vaping Use   Vaping Use: Never used  Substance Use Topics   Alcohol use: No   Drug use: No     Allergies   Patient has no known allergies.   Review of Systems Review of Systems  Constitutional:  Negative for fever.  HENT:  Positive for ear pain and sore throat. Negative for congestion and rhinorrhea.   Respiratory:  Positive for cough. Negative for shortness of breath and wheezing.     Physical Exam Triage Vital Signs ED Triage Vitals  Enc Vitals Group     BP 07/17/21 0806 120/85     Pulse Rate 07/17/21 0806 (!) 107     Resp 07/17/21 0806 20     Temp 07/17/21 0806 98.4 F (36.9 C)     Temp Source 07/17/21 0806 Oral     SpO2 07/17/21 0806 99 %     Weight 07/17/21 0805 173 lb (78.5 kg)     Height 07/17/21 0805 '5\' 6"'$  (1.676 m)     Head Circumference --      Peak Flow --      Pain Score 07/17/21 0805 6     Pain Loc --      Pain Edu? --      Excl. in [place]? --    No data found.  Updated Vital Signs BP 120/85 (BP Location: Left Arm)   Pulse (!) 107   Temp 98.4 F (36.9 C) (Oral)   Resp 20   Ht '5\' 6"'$  (1.676 m)   Wt 173 lb (78.5 kg)   LMP 06/25/2021 (Approximate)   SpO2 99%   BMI 27.92 kg/m   Visual Acuity Right Eye Distance:   Left Eye Distance:   Bilateral Distance:    Right Eye Near:   Left Eye Near:    Bilateral Near:     Physical Exam Vitals and nursing note reviewed.  Constitutional:      Appearance: Normal appearance. She is not ill-appearing.  HENT:     Head: Normocephalic and atraumatic.     Right Ear: Tympanic membrane, ear canal and external ear normal. There is no impacted cerumen.     Left Ear: Tympanic membrane, ear canal and external ear normal. There is no impacted cerumen.     Nose: Congestion and rhinorrhea present.     Mouth/Throat:     Mouth: Mucous membranes are moist.      Pharynx: Oropharynx is clear. Posterior oropharyngeal erythema present.  No oropharyngeal exudate.  Cardiovascular:     Rate and Rhythm: Normal rate and regular rhythm.     Pulses: Normal pulses.     Heart sounds: Normal heart sounds. No murmur heard.   No friction rub. No gallop.  Pulmonary:     Effort: Pulmonary effort is normal.     Breath sounds: Normal breath sounds. No wheezing, rhonchi or rales.  Musculoskeletal:     Cervical back: Normal range of motion and neck supple.  Lymphadenopathy:     Cervical: No cervical adenopathy.  Skin:    General: Skin is warm and dry.     Capillary Refill: Capillary refill takes less than 2 seconds.     Findings: No erythema or rash.  Neurological:     General: No focal deficit present.     Mental Status: She is alert and oriented to person, place, and time.  Psychiatric:        Mood and Affect: Mood normal.        Behavior: Behavior normal.        Thought Content: Thought content normal.        Judgment: Judgment normal.     UC Treatments / Results  Labs (all labs ordered are listed, but only abnormal results are displayed) Labs Reviewed  GROUP A STREP BY PCR    EKG   Radiology No results found.  Procedures Procedures (including critical care time)  Medications Ordered in UC Medications - No data to display  Initial Impression / Assessment and Plan / UC Course  I have reviewed the triage vital signs and the nursing notes.  Pertinent labs & imaging results that were available during my care of the patient were reviewed by me and considered in my medical decision making (see chart for details).  Patient is a very pleasant, nontoxic-appearing 40 year old female here for evaluation of respiratory complaints as outlined in HPI above.  Her physical exam reveals pearly-gray tympanic membranes bilaterally with normal light reflex and clear external auditory canals.  Nasal mucosa is markedly erythematous and edematous with dried  light yellow discharge in both nares.  Oropharyngeal exam reveals benign tonsillar pillars but her posterior oropharynx is erythematous and mildly injected with clear postnasal drip.  No cervical of adenopathy appreciated on exam.  Cardiopulmonary exam reveals S1-S2 heart sounds with regular rate and rhythm and lung sounds that are clear to auscultation in all fields.  Will check strep PCR.  Strep PCR is negative.  I will discharge patient home with a diagnosis of viral upper respiratory infection with a cough and treat her with Atrovent nasal spray, Tessalon Perles, and Promethazine DM cough syrup.  Return precautions reviewed.   Final Clinical Impressions(s) / UC Diagnoses   Final diagnoses:  Viral URI with cough     Discharge Instructions      Your strep test today was negative.  The symptoms you display on physical exam indicate that you have an upper respiratory infection.  This is most likely viral.  I will treat you with the following medications to help your symptoms.  Use the Atrovent nasal spray, 2 squirts in each nostril every 6 hours, as needed for runny nose and postnasal drip.  Use the Tessalon Perles every 8 hours during the day.  Take them with a small sip of water.  They may give you some numbness to the base of your tongue or a metallic taste in your mouth, this is normal.  Use the Promethazine DM cough syrup  at bedtime for cough and congestion.  It will make you drowsy so do not take it during the day.  Return for reevaluation or see your primary care provider for any new or worsening symptoms.      ED Prescriptions     Medication Sig Dispense Auth. Provider   benzonatate (TESSALON) 100 MG capsule Take 2 capsules (200 mg total) by mouth every 8 (eight) hours. 21 capsule Margarette Canada, NP   ipratropium (ATROVENT) 0.06 % nasal spray Place 2 sprays into both nostrils 4 (four) times daily. 15 mL Margarette Canada, NP   promethazine-dextromethorphan (PROMETHAZINE-DM)  6.25-15 MG/5ML syrup Take 5 mLs by mouth 4 (four) times daily as needed. 118 mL Margarette Canada, NP      PDMP not reviewed this encounter.   Margarette Canada, NP 07/17/21 [phone]

## 2021-07-17 NOTE — Discharge Instructions (Addendum)
Your strep test today was negative.  The symptoms you display on physical exam indicate that you have an upper respiratory infection.  This is most likely viral.  I will treat you with the following medications to help your symptoms.  Use the Atrovent nasal spray, 2 squirts in each nostril every 6 hours, as needed for runny nose and postnasal drip.  Use the Tessalon Perles every 8 hours during the day.  Take them with a small sip of water.  They may give you some numbness to the base of your tongue or a metallic taste in your mouth, this is normal.  Use the Promethazine DM cough syrup at bedtime for cough and congestion.  It will make you drowsy so do not take it during the day.  Return for reevaluation or see your primary care provider for any new or worsening symptoms.

## 2021-07-18 DIAGNOSIS — Z79899 Other long term (current) drug therapy: Secondary | ICD-10-CM | POA: Diagnosis not present

## 2021-07-18 DIAGNOSIS — R002 Palpitations: Secondary | ICD-10-CM | POA: Diagnosis not present

## 2021-07-18 DIAGNOSIS — Z13220 Encounter for screening for lipoid disorders: Secondary | ICD-10-CM | POA: Diagnosis not present

## 2021-07-18 DIAGNOSIS — M4125 Other idiopathic scoliosis, thoracolumbar region: Secondary | ICD-10-CM | POA: Diagnosis not present

## 2021-07-18 DIAGNOSIS — Z0000 Encounter for general adult medical examination without abnormal findings: Secondary | ICD-10-CM | POA: Diagnosis not present

## 2021-07-18 DIAGNOSIS — G40309 Generalized idiopathic epilepsy and epileptic syndromes, not intractable, without status epilepticus: Secondary | ICD-10-CM | POA: Diagnosis not present

## 2021-07-18 DIAGNOSIS — M62830 Muscle spasm of back: Secondary | ICD-10-CM | POA: Diagnosis not present

## 2021-07-18 DIAGNOSIS — R635 Abnormal weight gain: Secondary | ICD-10-CM | POA: Diagnosis not present

## 2021-07-21 DIAGNOSIS — G43019 Migraine without aura, intractable, without status migrainosus: Secondary | ICD-10-CM | POA: Diagnosis not present

## 2021-07-21 DIAGNOSIS — Z79899 Other long term (current) drug therapy: Secondary | ICD-10-CM | POA: Diagnosis not present

## 2021-07-21 DIAGNOSIS — G40309 Generalized idiopathic epilepsy and epileptic syndromes, not intractable, without status epilepticus: Secondary | ICD-10-CM | POA: Diagnosis not present

## 2021-07-21 DIAGNOSIS — R202 Paresthesia of skin: Secondary | ICD-10-CM | POA: Diagnosis not present

## 2021-08-24 ENCOUNTER — Encounter: Payer: Self-pay | Admitting: Psychology

## 2021-10-21 DIAGNOSIS — E7800 Pure hypercholesterolemia, unspecified: Secondary | ICD-10-CM | POA: Diagnosis not present

## 2021-10-21 DIAGNOSIS — R946 Abnormal results of thyroid function studies: Secondary | ICD-10-CM | POA: Diagnosis not present

## 2021-11-18 ENCOUNTER — Ambulatory Visit
Admission: EM | Admit: 2021-11-18 | Discharge: 2021-11-18 | Disposition: A | Discharge status: Home / Self Care | Payer: Medicaid Other | Attending: Family Medicine | Admitting: Family Medicine

## 2021-11-18 ENCOUNTER — Encounter: Payer: Self-pay | Admitting: Emergency Medicine

## 2021-11-18 ENCOUNTER — Ambulatory Visit (INDEPENDENT_AMBULATORY_CARE_PROVIDER_SITE_OTHER): Payer: Medicaid Other

## 2021-11-18 DIAGNOSIS — W19XXXA Unspecified fall, initial encounter: Secondary | ICD-10-CM

## 2021-11-18 DIAGNOSIS — M79672 Pain in left foot: Secondary | ICD-10-CM

## 2021-11-18 DIAGNOSIS — S93402A Sprain of unspecified ligament of left ankle, initial encounter: Secondary | ICD-10-CM

## 2021-11-18 DIAGNOSIS — M25572 Pain in left ankle and joints of left foot: Secondary | ICD-10-CM | POA: Diagnosis not present

## 2021-11-18 DIAGNOSIS — S93602A Unspecified sprain of left foot, initial encounter: Secondary | ICD-10-CM

## 2021-11-18 NOTE — ED Triage Notes (Signed)
Patient states that she tripped on Tuesday at her home and got her tow caught.  Patient c/o pain in her left 3rd toe, left foot and left ankle.

## 2021-11-18 NOTE — ED Provider Notes (Signed)
[facility]    CSN: [id number] Arrival date & time: 11/18/21  1325      History   Chief Complaint Chief Complaint  Patient presents with   Foot Pain   Ankle Pain    left   Fall    HPI  HPI Alexandra Heath is a 41 y.o. female.   Alexandra Heath presents for left foot and ankle pain. On Tuesday, she tripped over a lanundry basket and fell onto the floor. She believes her foot got caught in one of the holes in the laundry basket.  Most of her pain is in her big toe. She is unable to bend it and it hurts to walk. She soaked it in a eposom salt bath.  She has not taken anything for pain as she states she does not taking pills.  She feels like it is swollen.  Denies redness, bruising, fever, nausea, vomiting, knee pain, hip pain, headache, back pain and neck pain.  Has history of a bad left ankle sprain when she was younger from a basketball injury.       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[place]) 09/18/2018   Migraines 06/12/2017   Numbness 04/06/2015   Palpitations 12/17/2014   Scoliosis 12/17/2014   Leg weakness 12/17/2014   Depression with anxiety 12/17/2014   Insomnia 12/17/2014   Polyneuropathy 09/10/2014   Back muscle spasm 06/15/2014   Idiopathic scoliosis 06/15/2014    Past Surgical History:  Procedure Laterality Date   CHOLECYSTECTOMY     GALLBLADDER SURGERY     TUBAL LIGATION N/A 12/02/2020   Procedure: POST PARTUM TUBAL LIGATION;  Surgeon:  Boykin Nearing, MD;  Location: [facility] ORS;  Service: Gynecology;  Laterality: N/A;    OB History     Gravida  7   Para  5   Term  5   Preterm  0   AB  2   Living  5      SAB  1   IAB  1   Ectopic      Multiple  0   Live Births  5            Home Medications    Prior to Admission medications   Medication Sig Start Date End Date Taking? Authorizing Provider  lamoTRIgine 100 MG TBDP Take 3 tablets by mouth 2 (two) times daily. 03/16/19  Yes [provider]  meclizine (ANTIVERT) 25 MG tablet Take 1 tablet (25 mg total) by mouth 3 (three) times daily as needed for dizziness. 01/05/21  Yes Vanessa [place], MD  propranolol (INDERAL) 20 MG/5ML solution Take 5 mLs (20 mg total) by mouth 2 (two) times daily. 12/03/20  Yes Linda Hedges, CNM  rosuvastatin (CRESTOR) 10 MG tablet Take 10 mg by mouth daily. 10/14/21  Yes [provider]  benzonatate (TESSALON) 100 MG capsule Take 2 capsules (200 mg total) by mouth every 8 (eight) hours. 07/17/21   Margarette Canada, NP  cyclobenzaprine [facility])  5 MG tablet Take 1 tablet (5 mg total) by mouth at bedtime. 03/20/21   Hans Eden, NP  ferrous sulfate 325 (65 FE) MG tablet Take 325 mg by mouth daily with breakfast.    [provider]  ipratropium (ATROVENT) 0.06 % nasal spray Place 2 sprays into both nostrils 4 (four) times daily. 07/17/21   Margarette Canada, NP  naproxen (NAPROSYN) 250 MG tablet Take 2 tablets (500 mg total) by mouth 2 (two) times daily with a meal. 03/20/21   White, Leitha Schuller, NP  promethazine-dextromethorphan (PROMETHAZINE-DM) 6.25-15 MG/5ML syrup Take 5 mLs by mouth 4 (four) times daily as needed. 07/17/21   Margarette Canada, NP  medroxyPROGESTERone (DEPO-PROVERA) 150 MG/ML injection Inject 150 mg into the muscle every 3 (three) months. 01/31/18 04/01/19  [provider]  omeprazole (PRILOSEC) 20 MG capsule Take 20 mg by mouth daily as needed.  04/01/19  [provider]  SUMAtriptan  (IMITREX) 50 MG tablet Take 50 mg by mouth every 2 (two) hours as needed for migraine. May repeat in 2 hours if headache persists or recurs.  04/01/19  [provider]    Family History Family History  Problem Relation Age of Onset   HIV Mother    Anemia Mother    Migraines Mother    Depression Mother    Hypertension Father    Diabetes Father    Hypertension Maternal Grandmother    Breast cancer Neg Hx     Social History Social History   Tobacco Use   Smoking status: Never   Smokeless tobacco: Never  Vaping Use   Vaping Use: Never used  Substance Use Topics   Alcohol use: No   Drug use: No     Allergies   Patient has no known allergies.   Review of Systems Review of Systems: :negative unless otherwise stated in HPI.      Physical Exam Triage Vital Signs ED Triage Vitals  Enc Vitals Group     BP 11/18/21 1439 120/82     Pulse Rate 11/18/21 1439 85     Resp 11/18/21 1439 14     Temp 11/18/21 1439 98.8 F (37.1 C)     Temp Source 11/18/21 1439 Oral     SpO2 11/18/21 1439 98 %     Weight 11/18/21 1436 173 lb 1 oz (78.5 kg)     Height 11/18/21 1436 '5\' 6"'$  (1.676 m)     Head Circumference --      Peak Flow --      Pain Score 11/18/21 1436 6     Pain Loc --      Pain Edu? --      Excl. in [place]? --    No data found.  Updated Vital Signs BP 120/82 (BP Location: Left Arm)   Pulse 85   Temp 98.8 F (37.1 C) (Oral)   Resp 14   Ht '5\' 6"'$  (1.676 m)   Wt 78.5 kg   LMP 10/28/2021 (Approximate)   SpO2 98%   Breastfeeding No   BMI 27.93 kg/m   Visual Acuity Right Eye Distance:   Left Eye Distance:   Bilateral Distance:    Right Eye Near:   Left Eye Near:    Bilateral Near:     Physical Exam GEN: well appearing female in no acute distress  CVS: well perfused  RESP: speaking in full sentences without pause, no respiratory distress  MSK: Ankle/Foot, left : TTP noted at the 4th metacarpal and phalange  and calcaneus. No visible erythema, swelling,  ecchymosis, or bony deformity.  Transverse arch grossly  intact; No  evidence of tibiotalar  deviation; Range of motion is full  in all directions. Strength is 5/5  in all directions. No  tenderness at the insertion/body/myotendinous junction of the Achilles tendon; Tenderness on posterior aspects of lateral malleolus; Talar dome tenderness;   No  tenderness over the navicular prominence; No  tenderness over cuboid; No  pain at base of 5th MT; Able to walk 4 steps with pain    UC Treatments / Results  Labs (all labs ordered are listed, but only abnormal results are displayed) Labs Reviewed - No data to display  EKG   Radiology DG Ankle Complete Left  Result Date: 11/18/2021 CLINICAL DATA:  Pain after fall with most pain in the second toe EXAM: LEFT ANKLE COMPLETE - 3+ VIEW; LEFT FOOT - COMPLETE 3+ VIEW COMPARISON:  None Available. FINDINGS: There is no evidence of fracture, dislocation, or joint effusion. There is no evidence of arthropathy or other focal bone abnormality. Os peroneum. Soft tissues are unremarkable. IMPRESSION: No acute fracture or dislocation. Electronically Signed   By: Placido Sou M.D.   On: 11/18/2021 16:01   DG Foot Complete Left  Result Date: 11/18/2021 CLINICAL DATA:  Pain after fall with most pain in the second toe EXAM: LEFT ANKLE COMPLETE - 3+ VIEW; LEFT FOOT - COMPLETE 3+ VIEW COMPARISON:  None Available. FINDINGS: There is no evidence of fracture, dislocation, or joint effusion. There is no evidence of arthropathy or other focal bone abnormality. Os peroneum. Soft tissues are unremarkable. IMPRESSION: No acute fracture or dislocation. Electronically Signed   By: Placido Sou M.D.   On: 11/18/2021 16:01    Procedures Procedures (including critical care time)  Medications Ordered in UC Medications - No data to display  Initial Impression / Assessment and Plan / UC Course  I have reviewed the triage vital signs and the nursing notes.  Pertinent labs &  imaging results that were available during my care of the patient were reviewed by me and considered in my medical decision making (see chart for details).        Pt is a 41 y.o.  female with left foot and ankle pain for the past 4 days.  Obtained left foot and ankle x-rays due to tenderness, on exam.  Radiologist notes no acute fracture or dislocation.  There is no soft tissue swelling.  Patient to gradually return to normal activities, as tolerated and continue ordinary activities within the limits permitted by pain.  Over-the-counter analgesics including Voltaren gel as needed for pain. Counseled patient on red flag symptoms and when to seek immediate care.   Patient to follow up with orthopedic provider, if symptoms do not improve with conservative treatment.  Return and ED precautions given.   Discussed MDM, treatment plan and plan for follow-up with patient who agrees with plan.   Final Clinical Impressions(s) / UC Diagnoses   Final diagnoses:  Fall, initial encounter  Sprain of left ankle, unspecified ligament, initial encounter  Sprain of left foot, initial encounter     Discharge Instructions      There is no fracture or low dislocated bones on your x-rays.  You probably injured the tendons or muscles in your toe.  Take Tylenol or ibuprofen as needed for pain.  Follow-up with your PCP if pain is not improving in the next 2 weeks.      ED Prescriptions  None    PDMP not reviewed this encounter.   Lyndee Hensen, DO 11/18/21 2048

## 2021-11-18 NOTE — Discharge Instructions (Signed)
There is no fracture or low dislocated bones on your x-rays.  You probably injured the tendons or muscles in your toe.  Take Tylenol or ibuprofen as needed for pain.  Follow-up with your PCP if pain is not improving in the next 2 weeks.

## 2022-01-17 DIAGNOSIS — Z79899 Other long term (current) drug therapy: Secondary | ICD-10-CM | POA: Diagnosis not present

## 2022-01-17 DIAGNOSIS — G40319 Generalized idiopathic epilepsy and epileptic syndromes, intractable, without status epilepticus: Secondary | ICD-10-CM | POA: Diagnosis not present

## 2022-01-17 DIAGNOSIS — M62830 Muscle spasm of back: Secondary | ICD-10-CM | POA: Diagnosis not present

## 2022-01-17 DIAGNOSIS — R002 Palpitations: Secondary | ICD-10-CM | POA: Diagnosis not present

## 2022-01-18 DIAGNOSIS — Z79899 Other long term (current) drug therapy: Secondary | ICD-10-CM | POA: Diagnosis not present

## 2022-01-20 DIAGNOSIS — R202 Paresthesia of skin: Secondary | ICD-10-CM | POA: Diagnosis not present

## 2022-01-20 DIAGNOSIS — Z87828 Personal history of other (healed) physical injury and trauma: Secondary | ICD-10-CM | POA: Diagnosis not present

## 2022-01-20 DIAGNOSIS — G43019 Migraine without aura, intractable, without status migrainosus: Secondary | ICD-10-CM | POA: Diagnosis not present

## 2022-01-20 DIAGNOSIS — G40309 Generalized idiopathic epilepsy and epileptic syndromes, not intractable, without status epilepticus: Secondary | ICD-10-CM | POA: Diagnosis not present

## 2022-03-08 ENCOUNTER — Telehealth: Payer: Self-pay

## 2022-03-08 NOTE — Progress Notes (Signed)
..  Medicaid Managed Care   Unsuccessful Outreach Note  03/08/2022 Name: Alexandra Heath MRN: [id number] DOB: 1980-10-22  Referred by: Idelle Crouch, MD Reason for referral : Appointment   An unsuccessful telephone outreach was attempted today. The patient was referred to the case management team for assistance with care management and care coordination.   Follow Up Plan: The care management team will reach out to the patient again over the next 7-14 days.   [place]

## 2022-03-09 ENCOUNTER — Encounter: Payer: Medicaid Other | Attending: Psychology | Admitting: Psychology

## 2022-03-09 ENCOUNTER — Encounter: Payer: Self-pay | Admitting: Psychology

## 2022-03-09 DIAGNOSIS — G629 Polyneuropathy, unspecified: Secondary | ICD-10-CM | POA: Diagnosis not present

## 2022-03-09 DIAGNOSIS — G40319 Generalized idiopathic epilepsy and epileptic syndromes, intractable, without status epilepticus: Secondary | ICD-10-CM | POA: Diagnosis not present

## 2022-03-09 DIAGNOSIS — G43111 Migraine with aura, intractable, with status migrainosus: Secondary | ICD-10-CM

## 2022-03-09 DIAGNOSIS — G3184 Mild cognitive impairment, so stated: Secondary | ICD-10-CM
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
## 2022-04-11 ENCOUNTER — Encounter: Payer: Medicaid Other | Attending: Psychology

## 2022-04-11 DIAGNOSIS — G629 Polyneuropathy, unspecified: Secondary | ICD-10-CM | POA: Diagnosis not present

## 2022-04-11 DIAGNOSIS — G40319 Generalized idiopathic epilepsy and epileptic syndromes, intractable, without status epilepticus: Secondary | ICD-10-CM | POA: Diagnosis not present

## 2022-04-11 DIAGNOSIS — G43111 Migraine with aura, intractable, with status migrainosus: Secondary | ICD-10-CM | POA: Diagnosis not present

## 2022-04-11 DIAGNOSIS — G3184 Mild cognitive impairment, so stated: Secondary | ICD-10-CM | POA: Insufficient documentation

## 2022-04-14 NOTE — Progress Notes (Signed)
Behavioral Observations The patient appeared well-groomed and appropriately dressed. Her manners were polite and appropriate to the situation. Her attitude towards testing was positive and her effort was good.   Neuropsychology Note  Alexandra Heath completed 150 minutes of neuropsychological testing with technician, Wandra Scot, BA, under the supervision of Ilean Skill, PsyD., Clinical Neuropsychologist. The patient did not appear overtly distressed by the testing session, per behavioral observation or via self-report to the technician. Rest breaks were offered.   Clinical Decision Making: In considering the patient's current level of functioning, level of presumed impairment, nature of symptoms, emotional and behavioral responses during clinical interview, level of literacy, and observed level of motivation/effort, a battery of tests was selected by Dr. Sima Matas during initial consultation on 03/09/2022. This was communicated to the technician. Communication between the neuropsychologist and technician was ongoing throughout the testing session and changes were made as deemed necessary based on patient performance on testing, technician observations and additional pertinent factors such as those listed above.  Tests Administered: Wechsler Adult Intelligence Scale, 4th Edition (WAIS-IV) Wechsler Memory Scale, 4th Edition (WMS-IV); Adult Battery  Results:  Composite Score Summary  Scale Sum of Scaled Scores Composite Score Percentile Rank 95% Conf. Interval Qualitative Description  Verbal Comprehension 25 VCI 91 27 86-97 Average  Perceptual Reasoning 29 PRI 98 45 92-104 Average  Working Memory 17 WMI 92 30 86-99 Average  Processing Speed 17 PSI 92 30 84-101 Average  Full Scale 88 FSIQ 92 30 88-96 Average  General Ability 54 GAI 94 34 89-99 Average   Verbal Comprehension Subtests Summary  Subtest Raw Score Scaled Score Percentile Rank Reference Group Scaled Score SEM   Similarities '21 8 25 8 '$ 1.04  Vocabulary '31 8 25 9 '$ 0.73  Information 11 9 37 9 0.85   Perceptual Reasoning Subtests Summary  Subtest Raw Score Scaled Score Percentile Rank Reference Group Scaled Score SEM  Block Design '29 7 16 7 '$ 0.99  Matrix Reasoning 18 10 50 9 0.95  Visual Puzzles 17 12 75 11 1.04   Working Doctor, general practice Raw Score Scaled Score Percentile Rank Reference Group Scaled Score SEM  Digit Span 26 9 37 9 0.73  Arithmetic '12 8 25 9 '$ 0.99   Processing Speed Subtests Summary  Subtest Raw Score Scaled Score Percentile Rank Reference Group Scaled Score SEM  Symbol Search '27 8 25 8 '$ 1.56  Coding 66 9 37 9 1.20   WMS-IV  Index Score Summary  Index Sum of Scaled Scores Index Score Percentile Rank 95% Confidence Interval Qualitative Descriptor  Auditory Memory (AMI) 33 90 25 84-97 Average  Visual Memory (VMI) 30 85 16 80-91 Low Average  Visual Working Memory (VWMI) 21 103 58 96-110 Average  Immediate Memory (IMI) 37 94 34 88-101 Average  Delayed Memory (DMI) 26 76 5 71-84 Borderline     Primary Subtest Scaled Score Summary  Subtest Domain Raw Score Scaled Score Percentile Rank  Logical Memory I AM 22 9 37  Logical Memory II AM '18 8 25  '$ Verbal Paired Associates I AM 28 9 37  Verbal Paired Associates II AM '7 7 16  '$ Designs I VM 83 12 75  Designs II VM 50 8 25  Visual Reproduction I VM 32 7 16  Visual Reproduction II VM '4 3 1  '$ Spatial Addition VWM 14 10 50  Symbol Span VWM 28 11 63   Auditory Memory Process Score Summary  Process Score Raw Score Scaled Score Percentile Rank Cumulative  Percentage (Base Rate)  LM II Recognition 23 - - 26-50%  VPA II Recognition 39 - - 26-50%  VPA II Word Recall '13 7 16 '$ -   Visual Memory Process Score Summary  Process Score Raw Score Scaled Score Percentile Rank Cumulative Percentage (Base Rate)  DE I Content 35 9 37 -  DE I Spatial 20 12 75 -  DE II Content 36 10 50 -  DE II Spatial '8 6 9 '$ -  DE II Recognition  16 - - 26-50%  VR II Recognition 3 - - 3-9%  VR II Copy 43 - - >75%   ABILITY-MEMORY ANALYSIS  Ability Score:  VCI: 91 Date of Testing:  WAIS-IV; WMS-IV 2022/04/11  Predicted Difference Method   Index Predicted WMS-IV Index Score Actual WMS-IV Index Score Difference Critical Value  Significant Difference Y/N Base Rate  Auditory Memory 95 90 5 8.91 N   Visual Memory 96 85 11 8.02 Y 20-25%  Visual Working Memory 95 103 -8 11.90 N   Immediate Memory 95 94 1 9.48 N   Delayed Memory 95 76 19 10.18 Y 5-10%  Statistical significance (critical value) at the .01 level.    Feedback to Patient: Alexandra Heath will return on 11/14/2022 for an interactive feedback session with Dr. Sima Matas at which time her test performances, clinical impressions and treatment recommendations will be reviewed in detail. The patient understands she can contact our office should she require our assistance before this time.  150 minutes spent face-to-face with patient administering standardized tests, 30 minutes spent scoring Environmental education officer). [CPT [id number], [id number]  Full report to follow.

## 2022-04-21 DIAGNOSIS — R251 Tremor, unspecified: Secondary | ICD-10-CM | POA: Diagnosis not present

## 2022-04-21 DIAGNOSIS — G43019 Migraine without aura, intractable, without status migrainosus: Secondary | ICD-10-CM | POA: Diagnosis not present

## 2022-04-21 DIAGNOSIS — Z79899 Other long term (current) drug therapy: Secondary | ICD-10-CM | POA: Diagnosis not present

## 2022-04-21 DIAGNOSIS — R42 Dizziness and giddiness: Secondary | ICD-10-CM | POA: Diagnosis not present

## 2022-04-21 DIAGNOSIS — G40309 Generalized idiopathic epilepsy and epileptic syndromes, not intractable, without status epilepticus: Secondary | ICD-10-CM | POA: Diagnosis not present

## 2022-04-21 DIAGNOSIS — G3184 Mild cognitive impairment, so stated: Secondary | ICD-10-CM | POA: Diagnosis not present

## 2022-04-21 DIAGNOSIS — R202 Paresthesia of skin: Secondary | ICD-10-CM | POA: Diagnosis not present

## 2022-05-05 DIAGNOSIS — Z79899 Other long term (current) drug therapy: Secondary | ICD-10-CM | POA: Diagnosis not present

## 2022-05-05 DIAGNOSIS — E7800 Pure hypercholesterolemia, unspecified: Secondary | ICD-10-CM | POA: Diagnosis not present

## 2022-05-05 DIAGNOSIS — R7989 Other specified abnormal findings of blood chemistry: Secondary | ICD-10-CM | POA: Diagnosis not present

## 2022-05-05 DIAGNOSIS — R002 Palpitations: Secondary | ICD-10-CM | POA: Diagnosis not present

## 2022-05-05 DIAGNOSIS — N183 Chronic kidney disease, stage 3 unspecified: Secondary | ICD-10-CM | POA: Diagnosis not present

## 2022-05-29 ENCOUNTER — Ambulatory Visit
Admission: EM | Admit: 2022-05-29 | Discharge: 2022-05-29 | Disposition: A | Discharge status: Home / Self Care | Payer: Medicaid Other | Attending: Family Medicine | Admitting: Family Medicine

## 2022-05-29 DIAGNOSIS — S46812A Strain of other muscles, fascia and tendons at shoulder and upper arm level, left arm, initial encounter: Secondary | ICD-10-CM | POA: Diagnosis not present

## 2022-05-29 MED ORDER — CYCLOBENZAPRINE HCL 10 MG PO TABS: 10.0000 mg | ORAL_TABLET | Freq: Three times a day (TID) | ORAL | 0 refills | Status: AC | PRN

## 2022-05-29 MED ORDER — NAPROXEN 500 MG PO TABS: 500.0000 mg | ORAL_TABLET | Freq: Two times a day (BID) | ORAL | 0 refills | Status: AC

## 2022-05-29 NOTE — ED Provider Notes (Signed)
[facility]    CSN: [id number] Arrival date & time: 05/29/22  1627      History   Chief Complaint Chief Complaint  Patient presents with   Neck Pain   Back Pain    HPI  HPI Alexandra Heath is a 42 y.o. female.   Alexandra Heath presents for neck and upper back pain that started yesterday. The pain came throughout the day. She was moving things last week.  This morning, the pain was "terrible."  Pain is worse with lifting her arm, turning her head and looking up or down.    Pain described as aching and pulling sensation and rated 9/10. Pain is constant and radiates down her right side.  Her son put some Icy hot on her but that didn't help. She didn't take anything by mouth for pain. Denies weakness, numbness, tingling, dysuria, leg pain, abdominal pain, chest pain, incontinence, pelvic pain, perianal numbness. Continues to have  pain with movement. [name] has scoliosis.     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 09/18/2018   Migraines 06/12/2017   Numbness 04/06/2015   Palpitations 12/17/2014   Scoliosis 12/17/2014   Leg weakness 12/17/2014   Depression with anxiety 12/17/2014   Insomnia 12/17/2014   Polyneuropathy 09/10/2014   Back muscle spasm 06/15/2014   Idiopathic scoliosis 06/15/2014    Past Surgical History:  Procedure Laterality Date   CHOLECYSTECTOMY     GALLBLADDER SURGERY     TUBAL LIGATION N/A 12/02/2020   Procedure: POST PARTUM TUBAL LIGATION;  Surgeon:  Suzy Bouchard, MD;  Location: [facility] ORS;  Service: Gynecology;  Laterality: N/A;    OB History     Gravida  7   Para  5   Term  5   Preterm  0   AB  2   Living  5      SAB  1   IAB  1   Ectopic      Multiple  0   Live Births  5            Home Medications    Prior to Admission medications   Medication Sig Start Date End Date Taking? Authorizing Provider  cyclobenzaprine (FLEXERIL) 10 MG tablet Take 1 tablet (10 mg total) by mouth 3 (three) times daily as needed for muscle spasms. 05/29/22   Katha Cabal, DO  ferrous sulfate 325 (65 FE) MG tablet Take 325 mg by mouth daily with breakfast.    [provider]  ipratropium (ATROVENT) 0.06 % nasal spray Place 2 sprays into both nostrils 4 (four) times daily. 07/17/21   Becky Augusta, NP  lamoTRIgine 100 MG TBDP Take 3 tablets by mouth 2 (two) times daily. 03/16/19   [provider]  meclizine (ANTIVERT) 25 MG tablet Take 1 tablet (25 mg total) by mouth 3 (three) times daily as needed for dizziness. 01/05/21   Concha Se, MD  naproxen (NAPROSYN) 500 MG tablet Take 1 tablet (500 mg total) by  mouth 2 (two) times daily with a meal. 05/29/22   Myson Levi, DO  promethazine-dextromethorphan (PROMETHAZINE-DM) 6.25-15 MG/5ML syrup Take 5 mLs by mouth 4 (four) times daily as needed. 07/17/21   Becky Augusta, NP  propranolol (INDERAL) 20 MG/5ML solution Take 5 mLs (20 mg total) by mouth 2 (two) times daily. 12/03/20   Haroldine Laws, CNM  rosuvastatin (CRESTOR) 10 MG tablet Take 10 mg by mouth daily. 10/14/21   [provider]  medroxyPROGESTERone (DEPO-PROVERA) 150 MG/ML injection Inject 150 mg into the muscle every 3 (three) months. 01/31/18 04/01/19  [provider]  omeprazole (PRILOSEC) 20 MG capsule Take 20 mg by mouth daily as needed.  04/01/19  [provider]  SUMAtriptan (IMITREX) 50 MG tablet Take 50 mg by mouth every 2 (two) hours as needed for migraine. May repeat in 2  hours if headache persists or recurs.  04/01/19  [provider]    Family History Family History  Problem Relation Age of Onset   HIV Mother    Anemia Mother    Migraines Mother    Depression Mother    Hypertension Father    Diabetes Father    Hypertension Maternal Grandmother    Breast cancer Neg Hx     Social History Social History   Tobacco Use   Smoking status: Never   Smokeless tobacco: Never  Vaping Use   Vaping Use: Never used  Substance Use Topics   Alcohol use: No   Drug use: No     Allergies   Patient has no known allergies.   Review of Systems Review of Systems: egative unless otherwise stated in HPI.      Physical Exam Triage Vital Signs ED Triage Vitals  Enc Vitals Group     BP 05/29/22 1654 131/79     Pulse Rate 05/29/22 1654 76     Resp --      Temp 05/29/22 1654 98.2 F (36.8 C)     Temp Source 05/29/22 1654 Oral     SpO2 05/29/22 1654 97 %     Weight 05/29/22 1653 174 lb (78.9 kg)     Height 05/29/22 1653 5\' 6"  (1.676 m)     Head Circumference --      Peak Flow --      Pain Score 05/29/22 1653 10     Pain Loc --      Pain Edu? --      Excl. in GC? --    No data found.  Updated Vital Signs BP 131/79 (BP Location: Right Arm)   Pulse 76   Temp 98.2 F (36.8 C) (Oral)   Ht 5\' 6"  (1.676 m)   Wt 78.9 kg   LMP 05/29/2022 (Approximate)   SpO2 97%   BMI 28.08 kg/m   Visual Acuity Right Eye Distance:   Left Eye Distance:   Bilateral Distance:    Right Eye Near:   Left Eye Near:    Bilateral Near:     Physical Exam GEN: well appearing female in no acute distress  CVS: well perfused  RESP: speaking in full sentences without pause, no respiratory distress  MSK:  Spine: - Inspection: no gross deformity or asymmetry, swelling or ecchymosis. No skin changes  - Palpation: TTP over the spinous processes and bilateral thoracic paraspinal muscles, no SI joint tenderness bilaterally, TTP of left SCM, hypertonicity of SCM  and paraspinal muscles, normal lumbar spine without TTP   - ROM: limited rotation of thoracic spine and  cervical spine due to acute pain SKIN: warm, dry, no overly skin rash or erythema    UC Treatments / Results  Labs (all labs ordered are listed, but only abnormal results are displayed) Labs Reviewed - No data to display  EKG   Radiology No results found.   Procedures Procedures (including critical care time)  Medications Ordered in UC Medications - No data to display  Initial Impression / Assessment and Plan / UC Course  I have reviewed the triage vital signs and the nursing notes.  Pertinent labs & imaging results that were available during my care of the patient were reviewed by me and considered in my medical decision making (see chart for details).      Pt is a 42 y.o.  female with acute onset left sided neck pain and upper back pain after moving last week.   Exam is concerning for a muscular strain. After shared decision making will defer imaging at this time.   Patient to gradually return to normal activities, as tolerated and continue ordinary activities within the limits permitted by pain. Prescribed Naproxen sodium  and muscle relaxer  for pain relief.  Advised patient to avoid other NSAIDs while taking Naprosyn. Tylenol and Lidocaine patches PRN for multimodal pain relief. Counseled patient on red flag symptoms and when to seek immediate care.  No red flags suggesting cauda equina syndrome or progressive major motor weakness. Patient to follow up with orthopedic provider if symptoms do not improve with conservative treatment.  Return and ED precautions given.    Discussed MDM, treatment plan and plan for follow-up with patient who agrees with plan.   Final Clinical Impressions(s) / UC Diagnoses   Final diagnoses:  Strain of left trapezius muscle, initial encounter     Discharge Instructions      If medication was prescribed, stop by the pharmacy to pick up  your prescriptions.  For your back pain, Take 1500 mg Tylenol twice a day, take muscle relaxer (Flexeril) 2-3 times a day, take Naprosyn twice a day,  as needed for pain. Consider stopping by the pharmacy or dollar store to pick up some Lidocaine patches. Apply for 12 hours and then remove.   Watch for worsening symptoms such as an increasing weakness or loss of sensation in your arms or legs, increasing pain and/or the loss of bladder or bowel function. Should any of these occur, go to the emergency department immediately.       ED Prescriptions     Medication Sig Dispense Auth. Provider   cyclobenzaprine (FLEXERIL) 10 MG tablet Take 1 tablet (10 mg total) by mouth 3 (three) times daily as needed for muscle spasms. 30 tablet Kashauna Celmer, DO   naproxen (NAPROSYN) 500 MG tablet Take 1 tablet (500 mg total) by mouth 2 (two) times daily with a meal. 30 tablet Daimien Patmon, DO      PDMP not reviewed this encounter.   Katha Cabal, DO 05/29/22 1729

## 2022-05-29 NOTE — Discharge Instructions (Addendum)
If medication was prescribed, stop by the pharmacy to pick up your prescriptions.  For your back pain, Take 1500 mg Tylenol twice a day, take muscle relaxer (Flexeril) 2-3 times a day, take Naprosyn twice a day,  as needed for pain. Consider stopping by the pharmacy or dollar store to pick up some Lidocaine patches. Apply for 12 hours and then remove.   Watch for worsening symptoms such as an increasing weakness or loss of sensation in your arms or legs, increasing pain and/or the loss of bladder or bowel function. Should any of these occur, go to the emergency department immediately.

## 2022-05-29 NOTE — ED Triage Notes (Addendum)
Pt c/o neck pain & back pain onset yesterday, denies any fall or injury. Pt states pain yesterday mostly felt sore but pain is much worse today. Pt states she has not taken anything for pain.

## 2022-07-14 DIAGNOSIS — Z79899 Other long term (current) drug therapy: Secondary | ICD-10-CM | POA: Diagnosis not present

## 2022-07-14 DIAGNOSIS — E7800 Pure hypercholesterolemia, unspecified: Secondary | ICD-10-CM | POA: Diagnosis not present

## 2022-07-20 DIAGNOSIS — E782 Mixed hyperlipidemia: Secondary | ICD-10-CM | POA: Diagnosis not present

## 2022-07-20 DIAGNOSIS — Z0000 Encounter for general adult medical examination without abnormal findings: Secondary | ICD-10-CM | POA: Diagnosis not present

## 2022-07-20 DIAGNOSIS — G40309 Generalized idiopathic epilepsy and epileptic syndromes, not intractable, without status epilepticus: Secondary | ICD-10-CM | POA: Diagnosis not present

## 2022-07-20 DIAGNOSIS — Z79899 Other long term (current) drug therapy: Secondary | ICD-10-CM | POA: Diagnosis not present

## 2022-07-20 DIAGNOSIS — M4125 Other idiopathic scoliosis, thoracolumbar region: Secondary | ICD-10-CM | POA: Diagnosis not present

## 2022-07-20 DIAGNOSIS — Z1231 Encounter for screening mammogram for malignant neoplasm of breast: Secondary | ICD-10-CM | POA: Diagnosis not present

## 2022-07-20 DIAGNOSIS — G629 Polyneuropathy, unspecified: Secondary | ICD-10-CM | POA: Diagnosis not present

## 2022-07-20 DIAGNOSIS — R002 Palpitations: Secondary | ICD-10-CM | POA: Diagnosis not present

## 2022-07-20 DIAGNOSIS — R7309 Other abnormal glucose: Secondary | ICD-10-CM | POA: Diagnosis not present

## 2022-07-21 ENCOUNTER — Other Ambulatory Visit: Payer: Self-pay | Admitting: Internal Medicine

## 2022-07-21 DIAGNOSIS — Z1231 Encounter for screening mammogram for malignant neoplasm of breast: Secondary | ICD-10-CM

## 2022-08-01 ENCOUNTER — Ambulatory Visit
Admission: RE | Admit: 2022-08-01 | Discharge: 2022-08-01 | Disposition: A | Discharge status: Home / Self Care | Payer: Medicaid Other | Source: Ambulatory Visit | Attending: Internal Medicine | Admitting: Internal Medicine

## 2022-08-01 DIAGNOSIS — Z1231 Encounter for screening mammogram for malignant neoplasm of breast: Secondary | ICD-10-CM | POA: Diagnosis not present

## 2022-08-02 DIAGNOSIS — G8929 Other chronic pain: Secondary | ICD-10-CM | POA: Diagnosis not present

## 2022-08-02 DIAGNOSIS — M5442 Lumbago with sciatica, left side: Secondary | ICD-10-CM | POA: Diagnosis not present

## 2022-08-02 DIAGNOSIS — M5414 Radiculopathy, thoracic region: Secondary | ICD-10-CM | POA: Diagnosis not present

## 2022-09-06 ENCOUNTER — Ambulatory Visit: Payer: Medicaid Other | Attending: Physical Medicine & Rehabilitation

## 2022-09-06 DIAGNOSIS — M546 Pain in thoracic spine: Secondary | ICD-10-CM | POA: Diagnosis not present

## 2022-09-06 DIAGNOSIS — M5459 Other low back pain: Secondary | ICD-10-CM | POA: Diagnosis not present

## 2022-09-06 NOTE — Therapy (Signed)
[facility] [address], [place], [address] Phone: [phone]   Fax:  [phone]  Physical Therapy Evaluation  Patient Details  Name: Alexandra Heath MRN: [id number] Date of Birth: 07-22-1980 Referring Provider (PT): Elijah Birk, MD   Encounter Date: 09/06/2022   PT End of Session - 09/06/22 1117     Visit Number 1    Number of Visits 17    Date for PT Re-Evaluation 11/03/22    PT Start Time 1117    PT Stop Time 1158    PT Time Calculation (min) 41 min    Activity Tolerance Patient tolerated treatment well    Behavior During Therapy [facility] for tasks assessed/performed             Past Medical History:  Diagnosis Date   Epilepsy (HCC)    Fibroids 05/14/2015   Rapid heart beat    Scoliosis    Scoliosis    Tachycardia     Past Surgical History:  Procedure Laterality Date   CHOLECYSTECTOMY     GALLBLADDER SURGERY     TUBAL LIGATION N/A 12/02/2020   Procedure: POST PARTUM TUBAL LIGATION;  Surgeon: Suzy Bouchard, MD;  Location: [facility] ORS;  Service: Gynecology;  Laterality: N/A;    There were no vitals filed for this visit.    Subjective Assessment - 09/06/22 1121     Subjective LBP: 5/10 currently (pt sitting on a chair), 10+/10 at most for the past 3 months. Upper thoracic spine: 3/10 currently, 10+/10 at most for the past 3 months.    Pertinent History LBP and upper thoracic spine has been bothering her. Pain has been chronic. Had PT for her back before a while ago which did not help. Last 8 months, the upper thoracic spine has been hurting more. Denies loss of bowel or bladder control or LE paresthesia. Pt has stayed the same since recent onset.    Patient Stated Goals Not to hurt so bad.    Currently in Pain? Yes    Pain Score 5     Pain Location Back    Pain Orientation Left;Right;Lower;Posterior    Pain Descriptors / Indicators Aching;Tightness   for low back and upper thoracic  spine.   Pain Type Chronic pain;Acute pain    Pain Onset More than a month ago    Pain Frequency Constant    Aggravating Factors  LBP: bending over, supine, prolonged standing. Upper thoracic spine: Pt states pain just happens, bending over, playing with her kids, sometimes wakes up in the morning and cannot move.    Pain Relieving Factors LBP: R or L S/L, reclined position. Upper thoracic spine: sitting                [facility] PT Assessment - 09/06/22 1130       Assessment   Medical Diagnosis LBP/M54.42,G89.29    Referring Provider (PT) Filomena Jungling I, MD    Onset Date/Surgical Date 08/03/22    Prior Therapy yes      Precautions   Precaution Comments tachycardia      Restrictions   Other Position/Activity Restrictions No known restrictions      Posture/Postural Control   Posture Comments forward neck, B protracted shoulders, movement preference around L1/2 joint, R lumbar convexity around L 2, L lateral shift starting at around L2.      AROM   Cervical Flexion WFL with lower cervical spine discomfort    Cervical Extension  WFL    Cervical - Right Side [facility] with L upper thoracic spine symptom reproduction    Cervical - Left Side [facility] with neck pain    Cervical - Right Rotation WFL    Cervical - Left Rotation WFL with posterior lateral neck pain    Lumbar Flexion very limited with low back pain (aberrant movement during the return motion)    Lumbar Extension very limited with pain around L2 area    Lumbar - Right Side Bend limited with L low back pain    Lumbar - Left Side Bend limited with R low back pain    Lumbar - Right Rotation WFL with low back pain    Lumbar - Left Rotation WFL with low back pain (not as bad as R rotation)      Strength   Right Hip Flexion 4/5    Right Hip Extension 3+/5    Right Hip ABduction 4-/5   with L low back pain   Left Hip Flexion 4/5    Left Hip Extension 3+/5   with L low back pain   Left Hip ABduction 4-/5   with L low  back pain   Right Knee Flexion 4+/5    Right Knee Extension 5/5    Left Knee Flexion 4+/5    Left Knee Extension 5/5      Palpation   Palpation comment B cervical paraspinal muscle tension L > R, B upper trap muscle tension, B lumbar paraspinal muscle tension. TTP L L4> L3> L2 TP (pain gradually eases with rest)      Ambulation/Gait   Gait Comments Decreased stance L LE, decreased trunk rotation                        Objective measurements completed on examination: See above findings.   No latex allergies  No BP problems.   HR has been normal per pt.   HR 89 BPM    B cervical paraspinal muscle tension L > R, B upper trap muscle tension, B lumbar paraspinal muscle tension. TTP L L4> L3> L2 TP (pain gradually eases with rest)  No improvement with R to L pressure to R lumbar convexity in standing   Therapeutic exercise  Seated L upper trunk rotation isometrics to promote gentle R lumbar rotation to promote more neutral posture 10x5 seconds   Improved exercise technique, movement at target joints, use of target muscles after mod verbal, visual, tactile cues  Response to treatment Fair tolerance to today's session.    Clinical Impression Pt is a 42 year old female who came to physical therapy secondary to chronic low back and thoracic pain. She also presents with altered posture, decreased trunk and B hip strength, reproduction of back pain with lumbar AROM all planes, TTP, and difficulty performing tasks which involve bending over, and standing. Pt will benefit from skilled physical therapy services to address the aforementioned deficits.                     PT Short Term Goals - 09/06/22 1218       PT SHORT TERM GOAL #1   Title Pt will be independent with her initial HEP to decrease pain, improve strength and ability to perform tasks which involve bending over, and standing more comfortably for her back.    Baseline Pt has not yet started  her initial HEP (09/06/2022)    Time 3  Period Weeks    Status New    Target Date 09/29/22               PT Long Term Goals - 09/06/22 1220       PT LONG TERM GOAL #1   Title Pt will have a decrease in low back pain to 5/10 or less at worst to promote ability to perform tasks which involve bending over, and standing more comfortably for her back.    Baseline 10+/10 low back pain at worst for the past 3 months (09/06/2022)    Time 8    Period Weeks    Status New    Target Date 11/03/22      PT LONG TERM GOAL #2   Title Pt will have a decrease in upper thoracic back pain to 5/10 or less at worst to promote ability to perform tasks which involve bending over, and standing more comfortably for her back.    Baseline 10+/10 upper thoracic back pain at worst for the past 3 months (09/06/2022)    Time 8    Period Weeks    Status New    Target Date 11/03/22      PT LONG TERM GOAL #3   Title Pt will improve B hip flexion, extension, and abduction strength by at least 1/2 MMT to promote ability to perform standing tasks with less back pain.    Baseline hip flexion 4/5 R and L, hip extension 3+/5 R and L, hip abduction 4-/5 R, and L (09/06/2022)    Time 8    Period Weeks    Status New    Target Date 11/03/22      PT LONG TERM GOAL #4   Title Pt will improve her lumbar spine FOTO score by at least 10 points as a demonstration of improved function.    Baseline lumbar spine FOTO 42 (09/06/2022)    Time 8    Period Weeks    Status New    Target Date 11/03/22                    Plan - 09/06/22 1230     Clinical Impression Statement Pt is a 41 year old female who came to physical therapy secondary to chronic low back and thoracic pain. She also presents with altered posture, decreased trunk and B hip strength, reproduction of back pain with lumbar AROM all planes, TTP, and difficulty performing tasks which involve bending over, and standing. Pt will benefit from skilled  physical therapy services to address the aforementioned deficits.    Personal Factors and Comorbidities Comorbidity 3+;Time since onset of injury/illness/exacerbation;Fitness    Comorbidities Epilepsy, scoliosis, tachycardia    Examination-Activity Limitations Stand;Bend;Lift;Sleep;Carry    Stability/Clinical Decision Making Stable/Uncomplicated    Clinical Decision Making Low    Rehab Potential Fair    PT Frequency 2x / week    PT Duration 8 weeks    PT Treatment/Interventions Therapeutic exercise;Therapeutic activities;Neuromuscular re-education;Patient/family education;Manual techniques;Dry needling;Spinal Manipulations;Joint Manipulations;Aquatic Therapy;Electrical Stimulation;Iontophoresis 4mg /ml Dexamethasone;Traction    PT Next Visit Plan Posture, scapular, trunk, hip strengthening, manual techniques, modalities PRN    Consulted and Agree with Plan of Care Patient             Patient will benefit from skilled therapeutic intervention in order to improve the following deficits and impairments:  Pain, Postural dysfunction, Improper body mechanics  Visit Diagnosis: Other low back pain - Plan: PT plan of care cert/re-cert  Pain in  thoracic spine - Plan: PT plan of care cert/re-cert     Problem List Patient Active Problem List   Diagnosis Date Noted   NSVD (normal spontaneous vaginal delivery) 12/03/2020   S/P tubal ligation 12/03/2020   Elevated blood-pressure reading without diagnosis of hypertension 12/26/2019   Encounter for supervision of high risk pregnancy due to advanced maternal age in primigravida 07/03/2019   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 [place], [address] Phone: [phone]   Fax:  [phone]  Name: Alexandra Heath MRN: [id number] Date of Birth: 06-16-1980

## 2022-09-12 ENCOUNTER — Ambulatory Visit: Payer: Medicaid Other

## 2022-09-12 DIAGNOSIS — M546 Pain in thoracic spine: Secondary | ICD-10-CM

## 2022-09-12 DIAGNOSIS — M5459 Other low back pain: Secondary | ICD-10-CM

## 2022-09-12 NOTE — Therapy (Signed)
[facility] [address], [place], [address] Phone: [phone]   Fax:  [phone]  Physical Therapy Treatment  Patient Details  Name: Alexandra Heath MRN: [id number] Date of Birth: 28-Dec-1980 Referring Provider (PT): Elijah Birk, MD   Encounter Date: 09/12/2022   PT End of Session - 09/12/22 1304     Visit Number 2    Number of Visits 17    Date for PT Re-Evaluation 11/03/22    PT Start Time 1305    PT Stop Time 1344    PT Time Calculation (min) 39 min    Activity Tolerance Patient tolerated treatment well    Behavior During Therapy [facility] for tasks assessed/performed              Past Medical History:  Diagnosis Date   Epilepsy (HCC)    Fibroids 05/14/2015   Rapid heart beat    Scoliosis    Scoliosis    Tachycardia     Past Surgical History:  Procedure Laterality Date   CHOLECYSTECTOMY     GALLBLADDER SURGERY     TUBAL LIGATION N/A 12/02/2020   Procedure: POST PARTUM TUBAL LIGATION;  Surgeon: Suzy Bouchard, MD;  Location: [facility] ORS;  Service: Gynecology;  Laterality: N/A;    There were no vitals filed for this visit.                      Objective measurements completed on examination: See above findings.    PCP: Marguarite Arbour, MD   REFERRING PROVIDER: Elijah Birk, MD   REFERRING DIAG: LBP/M54.42,G89.29   THERAPY DIAG:  Other low back pain  Pain in thoracic spine  Rationale for Evaluation and Treatment: Rehabilitation  ONSET DATE: 08/03/22 ( date PT referral signed)     Rationale for Evaluation and Treatment Rehabilitation  PERTINENT HISTORY: LBP and upper thoracic spine has been bothering her. Pain has been chronic. Had PT for her back before a while ago which did not help. Last 8 months, the upper thoracic spine has been hurting more. Denies loss of bowel or bladder control or LE paresthesia. Pt has stayed the same since recent  onset.   PRECAUTIONS: tachycardia, diastasis recti  SUBJECTIVE:   SUBJECTIVE STATEMENT: Back is ok today. No pain today.       PAIN:  Are you having pain? See subjective   TODAY'S TREATMENT:                                                                                                                                         DATE: 09/12/2022   No latex allergies  No BP problems.   HR has been normal per pt.   HR 89 BPM    B cervical paraspinal muscle tension L > R, B upper trap muscle tension, B lumbar paraspinal muscle tension.  TTP L L4> L3> L2 TP (pain gradually eases with rest)  No improvement with R to L pressure to R lumbar convexity in standing   Therapeutic exercise  Reclined  Hooklying  transversus abdominis contraction 10x5 seconds for 3 sets    Then with hip fallout 10x2    Demonstrates L pelvic rotation during L hip fallout    HR 81 BPM     Then with march 10x2 each LE      L posterior hip symptoms with L hip flexion, eases with rest  Bridge 2x L low back discomfort  Sitting with upright posture in neutral   With R to L pressure by PT to R thoracolumbar convexity    Decreased L low back pain   Transversus abdomis and glute max muscle contraction 10x5 seconds for 3 sets    With B scapular retraction 10x5 seconds for 3 sets   Improved exercise technique, movement at target joints, use of target muscles after mod verbal, visual, tactile cues  Response to treatment Pt tolerated session well without aggravation of symptoms.    Clinical Impression Worked on trunk strength secondary to weakness to help decrease stress to her low back. Decreased L low back discomfort with decreasing R thoracolumbar convexity. Pt will benefit from continued skilled physical therapy services to decrease pain, improve strength and function.       PATIENT EDUCATION: Education details: there-ex, HEP Person educated: Patient Education method: Explanation,  Demonstration, Tactile cues, and Verbal cues, handout Education comprehension: verbalized understanding and returned demonstration  HOME EXERCISE PROGRAM: Access Code: [facility] URL: [email or web address] Date: 09/12/2022 Prepared by: Loralyn Freshwater  Exercises - Supine Transversus Abdominis Bracing - Hands on Stomach  - 3 x daily - 7 x weekly - 3 sets - 10 reps - 5 seconds hold  - Bent Knee Fallouts  - 3 x daily - 7 x weekly - 3 sets - 10 reps                PT Short Term Goals - 09/06/22 1218       PT SHORT TERM GOAL #1   Title Pt will be independent with her initial HEP to decrease pain, improve strength and ability to perform tasks which involve bending over, and standing more comfortably for her back.    Baseline Pt has not yet started her initial HEP (09/06/2022)    Time 3    Period Weeks    Status New    Target Date 09/29/22               PT Long Term Goals - 09/06/22 1220       PT LONG TERM GOAL #1   Title Pt will have a decrease in low back pain to 5/10 or less at worst to promote ability to perform tasks which involve bending over, and standing more comfortably for her back.    Baseline 10+/10 low back pain at worst for the past 3 months (09/06/2022)    Time 8    Period Weeks    Status New    Target Date 11/03/22      PT LONG TERM GOAL #2   Title Pt will have a decrease in upper thoracic back pain to 5/10 or less at worst to promote ability to perform tasks which involve bending over, and standing more comfortably for her back.    Baseline 10+/10 upper thoracic back pain at worst for the past 3 months (09/06/2022)    Time 8  Period Weeks    Status New    Target Date 11/03/22      PT LONG TERM GOAL #3   Title Pt will improve B hip flexion, extension, and abduction strength by at least 1/2 MMT to promote ability to perform standing tasks with less back pain.    Baseline hip flexion 4/5 R and L, hip extension 3+/5 R and L, hip  abduction 4-/5 R, and L (09/06/2022)    Time 8    Period Weeks    Status New    Target Date 11/03/22      PT LONG TERM GOAL #4   Title Pt will improve her lumbar spine FOTO score by at least 10 points as a demonstration of improved function.    Baseline lumbar spine FOTO 42 (09/06/2022)    Time 8    Period Weeks    Status New    Target Date 11/03/22                    Plan - 09/12/22 1258     Clinical Impression Statement Worked on trunk strength secondary to weakness to help decrease stress to her low back. Decreased L low back discomfort with decreasing R thoracolumbar convexity. Pt will benefit from continued skilled physical therapy services to decrease pain, improve strength and function.    Personal Factors and Comorbidities Comorbidity 3+;Time since onset of injury/illness/exacerbation;Fitness    Comorbidities Epilepsy, scoliosis, tachycardia    Examination-Activity Limitations Stand;Bend;Lift;Sleep;Carry    Stability/Clinical Decision Making Stable/Uncomplicated    Rehab Potential Fair    PT Frequency 2x / week    PT Duration 8 weeks    PT Treatment/Interventions Therapeutic exercise;Therapeutic activities;Neuromuscular re-education;Patient/family education;Manual techniques;Dry needling;Spinal Manipulations;Joint Manipulations;Aquatic Therapy;Electrical Stimulation;Iontophoresis 4mg /ml Dexamethasone;Traction    PT Next Visit Plan Posture, scapular, trunk, hip strengthening, manual techniques, modalities PRN    Consulted and Agree with Plan of Care Patient             Patient will benefit from skilled therapeutic intervention in order to improve the following deficits and impairments:  Pain, Postural dysfunction, Improper body mechanics  Visit Diagnosis: Other low back pain  Pain in thoracic spine     Problem List Patient Active Problem List   Diagnosis Date Noted   NSVD (normal spontaneous vaginal delivery) 12/03/2020   S/P tubal ligation 12/03/2020    Elevated blood-pressure reading without diagnosis of hypertension 12/26/2019   Encounter for supervision of high risk pregnancy due to advanced maternal age in primigravida 07/03/2019   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 [place], [address] Phone: [phone]   Fax:  [phone]  Name: RAJ WHITMER MRN: [id number] Date of Birth: [birth year]

## 2022-09-14 ENCOUNTER — Ambulatory Visit: Payer: Medicaid Other

## 2022-09-18 ENCOUNTER — Ambulatory Visit: Payer: Medicaid Other

## 2022-09-20 ENCOUNTER — Ambulatory Visit: Payer: Medicaid Other | Attending: Physical Medicine & Rehabilitation

## 2022-09-20 DIAGNOSIS — M5459 Other low back pain: Secondary | ICD-10-CM | POA: Insufficient documentation

## 2022-09-20 DIAGNOSIS — M546 Pain in thoracic spine: Secondary | ICD-10-CM | POA: Insufficient documentation
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
## 2022-09-27 ENCOUNTER — Ambulatory Visit: Payer: Medicaid Other

## 2022-09-27 DIAGNOSIS — M5459 Other low back pain: Secondary | ICD-10-CM

## 2022-09-27 DIAGNOSIS — M546 Pain in thoracic spine: Secondary | ICD-10-CM

## 2022-09-27 NOTE — Therapy (Signed)
[facility] [address], [place], [address] Phone: [phone]   Fax:  [phone]  Physical Therapy Treatment  Patient Details  Name: Alexandra Heath MRN: [id number] Date of Birth: 1980/04/01 Referring Provider (PT): Elijah Birk, MD   Encounter Date: 09/27/2022   PT End of Session - 09/27/22 1053     Visit Number 4    Number of Visits 17    Date for PT Re-Evaluation 11/03/22    PT Start Time 1115    PT Stop Time 1157    PT Time Calculation (min) 42 min    Activity Tolerance Patient tolerated treatment well    Behavior During Therapy [facility] for tasks assessed/performed                Past Medical History:  Diagnosis Date   Epilepsy (HCC)    Fibroids 05/14/2015   Rapid heart beat    Scoliosis    Scoliosis    Tachycardia     Past Surgical History:  Procedure Laterality Date   CHOLECYSTECTOMY     GALLBLADDER SURGERY     TUBAL LIGATION N/A 12/02/2020   Procedure: POST PARTUM TUBAL LIGATION;  Surgeon: Suzy Bouchard, MD;  Location: [facility] ORS;  Service: Gynecology;  Laterality: N/A;    There were no vitals filed for this visit.  Objective measurements completed on examination: See above findings.    PCP: Marguarite Arbour, MD   REFERRING PROVIDER: Elijah Birk, MD   REFERRING DIAG: LBP/M54.42,G89.29   THERAPY DIAG:  Other low back pain  Pain in thoracic spine  Rationale for Evaluation and Treatment: Rehabilitation  ONSET DATE: 08/03/22 ( date PT referral signed)     Rationale for Evaluation and Treatment Rehabilitation  PERTINENT HISTORY: LBP and upper thoracic spine has been bothering her. Pain has been chronic. Had PT for her back before a while ago which did not help. Last 8 months, the upper thoracic spine has been hurting more. Denies loss of bowel or bladder control or LE paresthesia. Pt has stayed the same since recent onset.   PRECAUTIONS: tachycardia,  diastasis recti  SUBJECTIVE:   SUBJECTIVE STATEMENT: Patient reports 5/10 back pain on arrival. Reports has been completing her HEP       PAIN:  Are you having pain? See subjective   TODAY'S TREATMENT:                                                                                                                                         DATE: 09/20/2022   No latex allergies  No BP problems.   HR has been normal per pt.   HR 89 BPM    B cervical paraspinal muscle tension L > R, B upper trap muscle tension, B lumbar paraspinal muscle tension. TTP L L4> L3> L2 TP (pain gradually eases with rest)  No improvement with R to L pressure to R lumbar convexity in standing   Therapeutic exercise   Nustep level 2  x 5 minutes to improve lumbar mobility and pain modulation.     Seated B UT stretch with hand anchored under thigh 2 x 30 seconds each side, handout provided to patient  OMEGA seated row 15# 3 x 10  OMEGA lat pull down 15# 3 x 10  OMEGA paloff press 5# x 10 each direction, cues for core activation   Manual:  STM B UT in supine, thoracic and lumbar paraspinals in prone  - (+) trigger points B UT  Supine B UT stretch with overpressure at GHJ 3 x 30 seconds each side   Improved exercise technique, movement at target joints, use of target muscles after mod verbal, visual, tactile cues  Response to treatment Pt tolerated session well without aggravation of symptoms.    Clinical Impression  Patient arrives to treatment session with complaints of upper and lower back pain. Session focused on manual stretching/STM and scapular strengthening. Continued to complain of L UT soreness and pain at end of session. Patient will benefit from continued skilled physical therapy services to decrease pain, improve strength and function.    PATIENT EDUCATION: Education details: there-ex, HEP Person educated: Patient Education method: Explanation, Demonstration, Tactile cues, and  Verbal cues, handout Education comprehension: verbalized understanding and returned demonstration  HOME EXERCISE PROGRAM: Access Code: [facility] URL: [email or web address] Date: 09/12/2022 Prepared by: Loralyn Freshwater  Exercises - Supine Transversus Abdominis Bracing - Hands on Stomach  - 3 x daily - 7 x weekly - 3 sets - 10 reps - 5 seconds hold  - Bent Knee Fallouts  - 3 x daily - 7 x weekly - 3 sets - 10 reps  Access Code: [email or web address] URL: [email or web address] Date: 09/27/2022 Prepared by: Maylon Peppers  Exercises - Seated Upper Trapezius Stretch  - 2-3 x daily - 5-7 x weekly - 3-4 reps - 30 second hold     PT Short Term Goals - 09/06/22 1218       PT SHORT TERM GOAL #1   Title Pt will be independent with her initial HEP to decrease pain, improve strength and ability to perform tasks which involve bending over, and standing more comfortably for her back.    Baseline Pt has not yet started her initial HEP (09/06/2022)    Time 3    Period Weeks    Status New    Target Date 09/29/22               PT Long Term Goals - 09/06/22 1220       PT LONG TERM GOAL #1   Title Pt will have a decrease in low back pain to 5/10 or less at worst to promote ability to perform tasks which involve bending over, and standing more comfortably for her back.    Baseline 10+/10 low back pain at worst for the past 3 months (09/06/2022)    Time 8    Period Weeks    Status New    Target Date 11/03/22      PT LONG TERM GOAL #2   Title Pt will have a decrease in upper thoracic back pain to 5/10 or less at worst to promote ability to perform tasks which involve bending over, and standing more comfortably for her back.    Baseline 10+/10 upper thoracic back pain at worst for the past 3 months (09/06/2022)    Time 8  Period Weeks    Status New    Target Date 11/03/22      PT LONG TERM GOAL #3   Title Pt will improve B hip flexion, extension, and abduction strength  by at least 1/2 MMT to promote ability to perform standing tasks with less back pain.    Baseline hip flexion 4/5 R and L, hip extension 3+/5 R and L, hip abduction 4-/5 R, and L (09/06/2022)    Time 8    Period Weeks    Status New    Target Date 11/03/22      PT LONG TERM GOAL #4   Title Pt will improve her lumbar spine FOTO score by at least 10 points as a demonstration of improved function.    Baseline lumbar spine FOTO 42 (09/06/2022)    Time 8    Period Weeks    Status New    Target Date 11/03/22               Patient will benefit from skilled therapeutic intervention in order to improve the following deficits and impairments:     Visit Diagnosis: Other low back pain  Pain in thoracic spine     Problem List Patient Active Problem List   Diagnosis Date Noted   NSVD (normal spontaneous vaginal delivery) 12/03/2020   S/P tubal ligation 12/03/2020   Elevated blood-pressure reading without diagnosis of hypertension 12/26/2019   Encounter for supervision of high risk pregnancy due to advanced maternal age in primigravida 07/03/2019   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 [place], [address] Phone: [phone]   Fax:  [phone]  Name: Alexandra Heath MRN: [id number] Date of Birth: [birth year]

## 2022-09-30 DIAGNOSIS — G40909 Epilepsy, unspecified, not intractable, without status epilepticus: Secondary | ICD-10-CM | POA: Diagnosis not present

## 2022-09-30 DIAGNOSIS — G250 Essential tremor: Secondary | ICD-10-CM | POA: Diagnosis not present

## 2022-10-04 ENCOUNTER — Ambulatory Visit: Payer: Medicaid Other

## 2022-10-19 DIAGNOSIS — E782 Mixed hyperlipidemia: Secondary | ICD-10-CM | POA: Diagnosis not present

## 2022-10-19 DIAGNOSIS — R7309 Other abnormal glucose: Secondary | ICD-10-CM | POA: Diagnosis not present

## 2022-10-19 DIAGNOSIS — Z79899 Other long term (current) drug therapy: Secondary | ICD-10-CM | POA: Diagnosis not present

## 2022-10-21 ENCOUNTER — Other Ambulatory Visit: Payer: Self-pay

## 2022-10-21 ENCOUNTER — Emergency Department
Admission: EM | Admit: 2022-10-21 | Discharge: 2022-10-21 | Disposition: A | Discharge status: Home / Self Care | Payer: Medicaid Other | Attending: Student in an Organized Health Care Education/Training Program | Admitting: Student in an Organized Health Care Education/Training Program

## 2022-10-21 DIAGNOSIS — R202 Paresthesia of skin: Secondary | ICD-10-CM | POA: Diagnosis not present

## 2022-10-21 DIAGNOSIS — R9431 Abnormal electrocardiogram [ECG] [EKG]: Secondary | ICD-10-CM | POA: Diagnosis not present

## 2022-10-21 DIAGNOSIS — T7840XA Allergy, unspecified, initial encounter: Secondary | ICD-10-CM | POA: Insufficient documentation

## 2022-10-21 DIAGNOSIS — R0689 Other abnormalities of breathing: Secondary | ICD-10-CM | POA: Diagnosis not present

## 2022-10-21 DIAGNOSIS — I959 Hypotension, unspecified: Secondary | ICD-10-CM | POA: Diagnosis not present

## 2022-10-21 DIAGNOSIS — R609 Edema, unspecified: Secondary | ICD-10-CM | POA: Diagnosis not present

## 2022-10-21 DIAGNOSIS — R000 Tachycardia, unspecified: Secondary | ICD-10-CM | POA: Diagnosis not present

## 2022-10-21 LAB — CBC WITH DIFFERENTIAL/PLATELET

## 2022-10-21 LAB — BASIC METABOLIC PANEL
Anion gap: 10 (ref 5–15)
BUN: 14 mg/dL (ref 6–20)
CO2: 23 mmol/L (ref 22–32)
Calcium: 9.5 mg/dL (ref 8.9–10.3)
Chloride: 102 mmol/L (ref 98–111)
Creatinine, Ser: 1.31 mg/dL — ABNORMAL HIGH (ref 0.44–1.00)
GFR, Estimated: 52 mL/min — ABNORMAL LOW (ref >60)
Glucose, Bld: 97 mg/dL (ref 70–99)
Potassium: 4.2 mmol/L (ref 3.5–5.1)
Sodium: 135 mmol/L (ref 135–145)

## 2022-10-21 MED ORDER — PREDNISONE 20 MG PO TABS: 40.0000 mg | ORAL_TABLET | Freq: Every day | ORAL | 0 refills | Status: AC

## 2022-10-21 MED ORDER — ACETAMINOPHEN 325 MG PO TABS
650.0000 mg | ORAL_TABLET | Freq: Once | ORAL | Status: AC
  Administered 2022-10-21: 650 mg via ORAL
  Filled 2022-10-21: qty 2

## 2022-10-21 MED ORDER — PREDNISONE 20 MG PO TABS
40.0000 mg | ORAL_TABLET | Freq: Once | ORAL | Status: AC
  Administered 2022-10-21: 40 mg via ORAL
  Filled 2022-10-21: qty 2

## 2022-10-21 NOTE — ED Notes (Signed)
Patient given discharge instructions including prescriptions x1 and importance of follow up appt as needed with stated understanding. INT removed, cannula intact, pressure dressing applied. Patient stable and ambulatory with steady even gait on dispo.

## 2022-10-21 NOTE — ED Provider Notes (Addendum)
[facility] Provider Note    Event Date/Time   First MD Initiated Contact with Patient 10/21/22 1252     (approximate)   History   Allergic Reaction   HPI  Alexandra Heath is a 42 y.o. female history of palpitations anxiety presents to the ER for evaluation of reaction that occurred after she was eating grapes around lunch today.  Felt some tingling sensation in her tongue and throat and felt like they were swelling.  EMS was called given Benadryl with improvement in symptoms.  She now feels like her symptoms have resolved.  No history of allergic reaction or anaphylaxis in the past.  No urticaria.     Physical Exam   Triage Vital Signs: ED Triage Vitals  Encounter Vitals Group     BP 10/21/22 1225 138/81     Systolic BP Percentile --      Diastolic BP Percentile --      Pulse Rate 10/21/22 1225 (!) 123     Resp 10/21/22 1225 20     Temp 10/21/22 1225 98.6 F (37 C)     Temp src --      SpO2 10/21/22 1225 96 %     Weight --      Height --      Head Circumference --      Peak Flow --      Pain Score 10/21/22 1223 3     Pain Loc --      Pain Education --      Exclude from Growth Chart --     Most recent vital signs: Vitals:   10/21/22 1225  BP: 138/81  Pulse: (!) 123  Resp: 20  Temp: 98.6 F (37 C)  SpO2: 96%     Constitutional: Alert  Eyes: Conjunctivae are normal.  Head: Atraumatic. Nose: No congestion/rhinnorhea. Mouth/Throat: Mucous membranes are moist.  No uvular edema no tonsillar swelling.  No angioedema. Neck: Painless ROM.  Cardiovascular:   Good peripheral circulation. Respiratory: Normal respiratory effort.  No retractions.  Gastrointestinal: Soft and nontender.  Musculoskeletal:  no deformity Neurologic:  MAE spontaneously. No gross focal neurologic deficits are appreciated.  Skin:  Skin is warm, dry and intact. No rash noted. Psychiatric: Mood and affect are normal. Speech and behavior are normal.    ED  Results / Procedures / Treatments   Labs (all labs ordered are listed, but only abnormal results are displayed) Labs Reviewed  BASIC METABOLIC PANEL - Abnormal; Notable for the following components:      Result Value   Creatinine, Ser 1.31 (*)    GFR, Estimated 52 (*)    All other components within normal limits  CBC WITH DIFFERENTIAL/PLATELET  CBC WITH DIFFERENTIAL/PLATELET     EKG  ED ECG REPORT I, Willy Eddy, the attending physician, personally viewed and interpreted this ECG.   Date: 10/21/2022  EKG Time: 13:45  Rate: 90  Rhythm: sinus  Axis: normal  Intervals: normal qt  ST&T Change: no stemi, no depressions    RADIOLOGY    PROCEDURES:  Critical Care performed:   Procedures   MEDICATIONS ORDERED IN ED: Medications  predniSONE (DELTASONE) tablet 40 mg (40 mg Oral Given 10/21/22 1328)  acetaminophen (TYLENOL) tablet 650 mg (650 mg Oral Given 10/21/22 1328)     IMPRESSION / MDM / ASSESSMENT AND PLAN / ED COURSE  I reviewed the triage vital signs and the nursing notes.  Differential diagnosis includes, but is not limited to, allergic reaction, urticaria, angioedema, uvulitis  Patient presenting to the ER for evaluation of symptoms as described above.  Based on symptoms, risk factors and considered above differential, this presenting complaint could reflect a potentially life-threatening illness therefore the patient will be placed on continuous pulse oximetry and telemetry for monitoring.  Laboratory evaluation will be sent to evaluate for the above complaints.      Clinical Course as of 10/21/22 1504  Sat Oct 21, 2022  1440 Patient reassessed.  Feeling significantly improved.  There was an issue with the lab on CBC test tube not being able to be run.  Based on her presentation feel at this point as she is asymptomatic would provide little utility do not feel that additional lab sticks indicated at this time.  Does appear  stable and appropriate for outpatient follow-up. [PR]    Clinical Course User Index [PR] Willy Eddy, MD     FINAL CLINICAL IMPRESSION(S) / ED DIAGNOSES   Final diagnoses:  Allergic reaction, initial encounter     Rx / DC Orders   ED Discharge Orders          Ordered    predniSONE (DELTASONE) 20 MG tablet  Daily        10/21/22 1459             Note:  This document was prepared using Dragon voice recognition software and may include unintentional dictation errors.    Willy Eddy, MD 10/21/22 1504    Willy Eddy, MD 10/21/22 1520

## 2022-10-21 NOTE — ED Triage Notes (Signed)
[facility] reports pt coming from home. Pt ate some red grapes at home, throat is feeling scratchy, states the back of tongue feels swollen.

## 2022-10-23 DIAGNOSIS — G40309 Generalized idiopathic epilepsy and epileptic syndromes, not intractable, without status epilepticus: Secondary | ICD-10-CM | POA: Diagnosis not present

## 2022-10-23 DIAGNOSIS — G43019 Migraine without aura, intractable, without status migrainosus: Secondary | ICD-10-CM | POA: Diagnosis not present

## 2022-10-23 DIAGNOSIS — F411 Generalized anxiety disorder: Secondary | ICD-10-CM | POA: Diagnosis not present

## 2022-10-23 NOTE — Group Note (Deleted)

## 2022-10-26 ENCOUNTER — Encounter: Payer: Medicaid Other | Attending: Psychology | Admitting: Psychology

## 2022-10-26 ENCOUNTER — Encounter: Payer: Self-pay | Admitting: Psychology

## 2022-10-26 DIAGNOSIS — G40319 Generalized idiopathic epilepsy and epileptic syndromes, intractable, without status epilepticus: Secondary | ICD-10-CM | POA: Insufficient documentation

## 2022-10-26 DIAGNOSIS — Z599 Problem related to housing and economic circumstances, unspecified: Secondary | ICD-10-CM | POA: Diagnosis not present

## 2022-10-26 DIAGNOSIS — G43111 Migraine with aura, intractable, with status migrainosus: Secondary | ICD-10-CM | POA: Diagnosis not present

## 2022-10-26 DIAGNOSIS — G3184 Mild cognitive impairment, so stated: Secondary | ICD-10-CM | POA: Insufficient documentation

## 2022-10-26 DIAGNOSIS — E782 Mixed hyperlipidemia: Secondary | ICD-10-CM | POA: Diagnosis not present

## 2022-10-26 DIAGNOSIS — F411 Generalized anxiety disorder: Secondary | ICD-10-CM | POA: Insufficient documentation

## 2022-10-26 DIAGNOSIS — G629 Polyneuropathy, unspecified: Secondary | ICD-10-CM | POA: Diagnosis not present

## 2022-10-26 DIAGNOSIS — R002 Palpitations: Secondary | ICD-10-CM | POA: Diagnosis not present

## 2022-10-26 DIAGNOSIS — G40309 Generalized idiopathic epilepsy and epileptic syndromes, not intractable, without status epilepticus: Secondary | ICD-10-CM | POA: Diagnosis not present
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
## 2022-11-01 ENCOUNTER — Encounter: Payer: Medicaid Other | Admitting: Psychology

## 2022-11-14 ENCOUNTER — Encounter: Payer: Medicaid Other | Attending: Psychology | Admitting: Psychology

## 2022-11-14 DIAGNOSIS — F411 Generalized anxiety disorder: Secondary | ICD-10-CM | POA: Insufficient documentation

## 2022-11-14 DIAGNOSIS — G3184 Mild cognitive impairment, so stated: Secondary | ICD-10-CM | POA: Insufficient documentation

## 2022-11-14 DIAGNOSIS — G629 Polyneuropathy, unspecified: Secondary | ICD-10-CM | POA: Insufficient documentation

## 2022-11-14 DIAGNOSIS — G40319 Generalized idiopathic epilepsy and epileptic syndromes, intractable, without status epilepticus: Secondary | ICD-10-CM | POA: Insufficient documentation

## 2022-11-14 DIAGNOSIS — G43111 Migraine with aura, intractable, with status migrainosus: Secondary | ICD-10-CM | POA: Insufficient documentation

## 2023-01-25 DIAGNOSIS — G40309 Generalized idiopathic epilepsy and epileptic syndromes, not intractable, without status epilepticus: Secondary | ICD-10-CM | POA: Diagnosis not present

## 2023-01-25 DIAGNOSIS — E782 Mixed hyperlipidemia: Secondary | ICD-10-CM | POA: Diagnosis not present

## 2023-01-25 DIAGNOSIS — R002 Palpitations: Secondary | ICD-10-CM | POA: Diagnosis not present

## 2023-04-24 DIAGNOSIS — Z79899 Other long term (current) drug therapy: Secondary | ICD-10-CM | POA: Diagnosis not present

## 2023-04-24 DIAGNOSIS — G43019 Migraine without aura, intractable, without status migrainosus: Secondary | ICD-10-CM | POA: Diagnosis not present

## 2023-04-24 DIAGNOSIS — G40309 Generalized idiopathic epilepsy and epileptic syndromes, not intractable, without status epilepticus: Secondary | ICD-10-CM | POA: Diagnosis not present

## 2023-04-26 ENCOUNTER — Other Ambulatory Visit: Payer: Self-pay | Admitting: Internal Medicine

## 2023-04-26 DIAGNOSIS — Z79899 Other long term (current) drug therapy: Secondary | ICD-10-CM | POA: Diagnosis not present

## 2023-04-26 DIAGNOSIS — K429 Umbilical hernia without obstruction or gangrene: Secondary | ICD-10-CM | POA: Diagnosis not present

## 2023-04-26 DIAGNOSIS — R7309 Other abnormal glucose: Secondary | ICD-10-CM | POA: Diagnosis not present

## 2023-04-26 DIAGNOSIS — Z1231 Encounter for screening mammogram for malignant neoplasm of breast: Secondary | ICD-10-CM

## 2023-04-26 DIAGNOSIS — M4125 Other idiopathic scoliosis, thoracolumbar region: Secondary | ICD-10-CM | POA: Diagnosis not present

## 2023-04-26 DIAGNOSIS — E782 Mixed hyperlipidemia: Secondary | ICD-10-CM | POA: Diagnosis not present

## 2023-04-26 DIAGNOSIS — G40309 Generalized idiopathic epilepsy and epileptic syndromes, not intractable, without status epilepticus: Secondary | ICD-10-CM | POA: Diagnosis not present

## 2023-04-26 DIAGNOSIS — R002 Palpitations: Secondary | ICD-10-CM | POA: Diagnosis not present

## 2023-05-02 DIAGNOSIS — M6208 Separation of muscle (nontraumatic), other site: Secondary | ICD-10-CM | POA: Diagnosis not present

## 2023-05-02 DIAGNOSIS — K429 Umbilical hernia without obstruction or gangrene: Secondary | ICD-10-CM | POA: Diagnosis not present

## 2023-05-15 ENCOUNTER — Telehealth: Payer: Self-pay | Admitting: Physical Therapy

## 2023-05-15 NOTE — Telephone Encounter (Signed)
 Called pt to inquire about whether she wanted to move up sooner. Pt did not respond so left VM offering pt earlier slots.

## 2023-05-16 ENCOUNTER — Ambulatory Visit: Attending: Surgery | Admitting: Physical Therapy

## 2023-05-16 DIAGNOSIS — M5459 Other low back pain: Secondary | ICD-10-CM | POA: Insufficient documentation

## 2023-05-16 DIAGNOSIS — M546 Pain in thoracic spine: Secondary | ICD-10-CM | POA: Diagnosis not present

## 2023-05-16 DIAGNOSIS — M6281 Muscle weakness (generalized): Secondary | ICD-10-CM | POA: Diagnosis not present

## 2023-05-16 DIAGNOSIS — M6208 Separation of muscle (nontraumatic), other site: Secondary | ICD-10-CM | POA: Insufficient documentation

## 2023-05-16 NOTE — Therapy (Unsigned)
 OUTPATIENT PHYSICAL THERAPY THORACOLUMBAR EVALUATION   Patient Name: Alexandra Heath MRN: [id number] DOB:10-18-1980, 43 y.o., female Today's Date: 05/17/2023  END OF SESSION:      PT End of Session - 05/16/23 1600     Visit Number 1    Number of Visits 20    Date for PT Re-Evaluation 07/26/23    Authorization Type [place] [employer]    Authorization - Visit Number 1    Authorization - Number of Visits 20    Progress Note Due on Visit 10    PT Start Time 1600    PT Stop Time 1645    PT Time Calculation (min) 45 min    Activity Tolerance Patient tolerated treatment well    Behavior During Therapy [facility] for tasks assessed/performed             Past Medical History:  Diagnosis Date   Epilepsy (HCC)    Fibroids 05/14/2015   Rapid heart beat    Scoliosis    Scoliosis    Tachycardia    Past Surgical History:  Procedure Laterality Date   CHOLECYSTECTOMY     GALLBLADDER SURGERY     TUBAL LIGATION N/A 12/02/2020   Procedure: POST PARTUM TUBAL LIGATION;  Surgeon: Suzy Bouchard, MD;  Location: [facility] ORS;  Service: Gynecology;  Laterality: N/A;   Patient Active Problem List   Diagnosis Date Noted   NSVD (normal spontaneous vaginal delivery) 12/03/2020   S/P tubal ligation 12/03/2020   Elevated blood-pressure reading without diagnosis of hypertension 12/26/2019   Encounter for supervision of high risk pregnancy due to advanced maternal age in primigravida 07/03/2019   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 Osborne Oman   REFERRING PROVIDER: Dr. Sung Amabile   REFERRING DIAG: M62.08 (ICD-10-CM) - Separation of muscle  (nontraumatic), other site  Rationale for Evaluation and Treatment: Rehabilitation  THERAPY DIAG:  Muscle weakness (generalized)  Diastasis recti  ONSET DATE: 10+ years ago   SUBJECTIVE:                                                                                                                                                                                           SUBJECTIVE STATEMENT: See pertinent history   PERTINENT HISTORY:  Pt started experiencing diastasis recti around her third child in 2010. Since  then she has been managing it on her own. She also has a umbilical hernia that was confirmed by Dr. Sung Amabile on March 19th, 2025. He gave her permission to initiate PT before exploring further interventions. She has not experienced increased pain or discomfort because of the hernia or diastasis recti.   PAIN:  Are you having pain? No  PRECAUTIONS: Avoid positions that exacerbate dooming in area inside diastasis recti like crunches or leg lifts.   RED FLAGS: None   WEIGHT BEARING RESTRICTIONS: No  FALLS:  Has patient fallen in last 6 months? No  LIVING ENVIRONMENT: Lives with: lives with their family Lives in: Did not ask  Stairs: Did not ask  Has following equipment at home: None  OCCUPATION: Did not ask   PLOF: Independent  PATIENT GOALS: To reduce diastasis recti    NEXT MD VISIT: Not sure   OBJECTIVE:  Note: Objective measures were completed at Evaluation unless otherwise noted.  VITALS BP 118/74 HR 80 SpO2 100   DIAGNOSTIC FINDINGS:  None listed   PATIENT SURVEYS:  None performed   COGNITION: Overall cognitive status: Within functional limits for tasks assessed     SENSATION: WFL  MUSCLE LENGTH: Hamstrings: Right NT deg; Left NT deg Thomas test: Not tested         Ely's Test: Positive Bilaterally   POSTURE: No Significant postural limitations  PALPATION: No areas tender to palpate    LOWER EXTREMITY MMT:    MMT Right eval  Left eval  Hip flexion 4+ 4+  Hip extension 4- 4-  Hip abduction 4 4  Hip adduction 4- 4-  Hip internal rotation    Hip external rotation    Knee flexion 4+ 4+  Knee extension 4+ 4+  Ankle dorsiflexion    Ankle plantarflexion    Ankle inversion    Ankle eversion     (Blank rows = not tested)   FUNCTIONAL TESTS:  Sahrmann Test: Level 4       TREATMENT DATE:   05/16/23:                                                                                                                               THEREX   90/90 Supine Heel Touches 2 x 10  -dooming in diastasis recti with no symptoms Bird Dog 2 x 10    PATIENT EDUCATION:  Education details: Form and technique for appropriate exercise  Person educated: Patient Education method: Explanation, Demonstration, Verbal cues, and Handouts Education comprehension: verbalized understanding, returned demonstration, and verbal cues required  HOME EXERCISE PROGRAM: Access Code: [email or web address] URL: [email or web address] Date: 05/16/2023 Prepared by: Ellin Goodie  Exercises - Bird Dog  - 3-4 x weekly - 3 sets - 10 reps - Squat with Chair Touch  - 3-4 x weekly - 3 sets - 10 reps  ASSESSMENT:  CLINICAL IMPRESSION: Patient is a 43 y.o. AA female who was seen today for physical therapy evaluation  and treatment for abdominal weakness in the setting of recently diagnosed umbilical hernia and ongoing diastasis recti that has occurred 10 years ago. Pt demonstrates deficits that include decreased hip and abdominal strength and hip flexibility.  She will benefit from skilled PT to address these aforementioned deficits to lift and carry her children without risk of injury to low back or worsening diastasis recti.   OBJECTIVE IMPAIRMENTS: decreased strength, impaired flexibility, and obesity.   ACTIVITY LIMITATIONS: carrying, lifting, bending, squatting, and caring for others  PARTICIPATION LIMITATIONS: cleaning, community activity,  and yard work  PERSONAL FACTORS: 3+ comorbidities: Depression, anxiety and HTN  are also affecting patient's functional outcome.   REHAB POTENTIAL: Good  CLINICAL DECISION MAKING: Stable/uncomplicated  EVALUATION COMPLEXITY: Low   GOALS: Goals reviewed with patient? No  SHORT TERM GOALS: Target date: 05/31/2023  Patient will demonstrate undestanding of home exercise plan by performing exercises correctly with evidence of good carry over with min to no verbal or tactile cues .   Baseline: NT  Goal status: INITIAL  2.  Patient will be able to demonstrate understanding of transverse abdominis activation by performing posterior pelvic tilt in sitting with no cueing and without holding breath.   Baseline: NT  Goal status: INITIAL    LONG TERM GOALS: Target date: 07/26/2023  Patient will demonstrate an improvement in SF-36 score by >=10% indicating a decrease in disability and improvement in overall function.  Baseline: NT  Goal status: INITIAL  2.  Patient will improve abdominal strength by 1 level on Sahrmann test as evidence of improved core stability and mitigation of diastasis recti.  Baseline: Level 4   Goal status: INITIAL  3.  Patient will demonstrate understanding of how to utilize core stability and proper hip hinge technique while lifting objects that weight similarly to her 79 and 84 year old children or >=40 lbs from floor into standing.  Baseline: NT  Goal status: INITIAL   4.  Patient will improve hip strength by >=1/3  grade (ie 4 to 4-) for improved lumbar and pelvic stability   Baseline: Hip Ext R/L 4-/4-, Hip Abd R/L 4/4, Hip Add R/L 4-/4-  Goal status: INITIAL   PLAN:  PT FREQUENCY: 1-2x/week  PT DURATION: 10 weeks  PLANNED INTERVENTIONS: 97164- PT Re-evaluation, 97110-Therapeutic exercises, 97530- Therapeutic activity, 97112- Neuromuscular re-education, 97535- Self Care, 40981- Manual therapy, [phone]- Gait training, [phone]- Orthotic Fit/training, [phone]-  Aquatic Therapy, Patient/Family education, Balance training, Stair training, Dry Needling, Joint mobilization, Joint manipulation, Spinal manipulation, Spinal mobilization, Vestibular training, DME instructions, Cryotherapy, and Moist heat.  PLAN FOR NEXT SESSION: Inquire about continence issues,Take SF-36, perform dead lift test using #45 lb kettlebell. Modify bird dog to include plantigrade position, side planks, and Palloff press, likely work on hip hinge for improved biomechanics when lifting. TA activation with diaphragmatic breathing.   Ellin Goodie PT, DPT  [facility] [address] [place], [address] Phone: [phone]   Fax:  [phone]

## 2023-05-21 ENCOUNTER — Ambulatory Visit: Admitting: Physical Therapy

## 2023-05-21 DIAGNOSIS — M6281 Muscle weakness (generalized): Secondary | ICD-10-CM | POA: Diagnosis not present

## 2023-05-21 DIAGNOSIS — M5459 Other low back pain: Secondary | ICD-10-CM | POA: Diagnosis not present

## 2023-05-21 DIAGNOSIS — M6208 Separation of muscle (nontraumatic), other site: Secondary | ICD-10-CM

## 2023-05-21 DIAGNOSIS — M546 Pain in thoracic spine: Secondary | ICD-10-CM | POA: Diagnosis not present

## 2023-05-21 NOTE — Therapy (Signed)
 OUTPATIENT PHYSICAL THERAPY THORACOLUMBAR EVALUATION   Patient Name: Alexandra Heath MRN: [id number] DOB:[birth year], 43 y.o., female Today's Date: 05/21/2023  END OF SESSION:      PT End of Session - 05/21/23 1610     Visit Number 2    Number of Visits 20    Date for PT Re-Evaluation 07/26/23    Authorization Type [place] [employer]    Authorization - Visit Number 2    Authorization - Number of Visits 20    Progress Note Due on Visit 10    PT Start Time 1605    PT Stop Time 1645    PT Time Calculation (min) 40 min    Activity Tolerance Patient tolerated treatment well    Behavior During Therapy [facility] for tasks assessed/performed             Past Medical History:  Diagnosis Date   Epilepsy (HCC)    Fibroids 05/14/2015   Rapid heart beat    Scoliosis    Scoliosis    Tachycardia    Past Surgical History:  Procedure Laterality Date   CHOLECYSTECTOMY     GALLBLADDER SURGERY     TUBAL LIGATION N/A 12/02/2020   Procedure: POST PARTUM TUBAL LIGATION;  Surgeon: Suzy Bouchard, MD;  Location: [facility] ORS;  Service: Gynecology;  Laterality: N/A;   Patient Active Problem List   Diagnosis Date Noted   NSVD (normal spontaneous vaginal delivery) 12/03/2020   S/P tubal ligation 12/03/2020   Elevated blood-pressure reading without diagnosis of hypertension 12/26/2019   Encounter for supervision of high risk pregnancy due to advanced maternal age in primigravida 07/03/2019   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 Osborne Oman   REFERRING PROVIDER: Dr. Sung Amabile   REFERRING DIAG: M62.08 (ICD-10-CM) - Separation of muscle  (nontraumatic), other site  Rationale for Evaluation and Treatment: Rehabilitation  THERAPY DIAG:  Muscle weakness (generalized)  Diastasis recti  Other low back pain  ONSET DATE: 10+ years ago   SUBJECTIVE:                                                                                                                                                                                           SUBJECTIVE STATEMENT: Pt states that she is feeling increased low back pain at the beginning of the  session and she explained that he low back pain is common and she has learned how to live with it.   PERTINENT HISTORY:  Pt started experiencing diastasis recti around her third child in 2010. Since then she has been managing it on her own. She also has a umbilical hernia that was confirmed by Dr. Sung Amabile on March 19th, 2025. He gave her permission to initiate PT before exploring further interventions. She has not experienced increased pain or discomfort because of the hernia or diastasis recti.   PAIN:  Are you having pain? No  PRECAUTIONS: Avoid positions that exacerbate dooming in area inside diastasis recti like crunches or leg lifts.   RED FLAGS: None   WEIGHT BEARING RESTRICTIONS: No  FALLS:  Has patient fallen in last 6 months? No  LIVING ENVIRONMENT: Lives with: lives with their family Lives in: Did not ask  Stairs: Did not ask  Has following equipment at home: None  OCCUPATION: Did not ask   PLOF: Independent  PATIENT GOALS: To reduce diastasis recti    NEXT MD VISIT: Not sure   OBJECTIVE:  Note: Objective measures were completed at Evaluation unless otherwise noted.  VITALS BP 118/74 HR 80 SpO2 100   DIAGNOSTIC FINDINGS:  None listed   PATIENT SURVEYS:  None performed   COGNITION: Overall cognitive status: Within functional limits for tasks assessed     SENSATION: WFL  MUSCLE LENGTH: Hamstrings: Right NT deg; Left NT deg Thomas test: Not tested          Ely's Test: Positive Bilaterally   POSTURE: No Significant postural limitations  PALPATION: No areas tender to palpate    LOWER EXTREMITY MMT:    MMT Right eval Left eval  Hip flexion 4+ 4+  Hip extension 4- 4-  Hip abduction 4 4  Hip adduction 4- 4-  Hip internal rotation    Hip external rotation    Knee flexion 4+ 4+  Knee extension 4+ 4+  Ankle dorsiflexion    Ankle plantarflexion    Ankle inversion    Ankle eversion     (Blank rows = not tested)   FUNCTIONAL TESTS:  Sahrmann Test: Level 4       TREATMENT DATE:   05/21/23: THEREX  TM Warm up 1.0 mph for 5 min  OMEGA Palloff Press #10 3 x 10  Bird Dog against counter 2 x 10  -min VC to reach forward instead of out to side  Seated Anterior Pelvic Tilt with 2 sec hold 2 x 10   Physical Performance  SF-36 Assessment:  -Physical Functioning: 20% -Role Limitations due to physical health: 50% -Role Limitations due to emotional health: 33% -Energy/fatigue: 10% -Emotional well being: 32% -Social Functioning: 13% -Pain: 58% -General Health: 40% -Health Change: 50%    05/16/23:  THEREX   90/90 Supine Heel Touches 2 x 10  -dooming in diastasis recti with no symptoms Bird Dog 2 x 10    PATIENT EDUCATION:  Education details: Form and technique for appropriate exercise  Person educated: Patient Education method: Explanation, Demonstration, Verbal cues, and Handouts Education comprehension: verbalized understanding, returned demonstration, and verbal cues required  HOME EXERCISE PROGRAM: Access Code: [email or web address] URL: [email or web address] Date: 05/21/2023 Prepared by: Ellin Goodie  Exercises - Squat with Chair Touch  - 3-4 x weekly - 3 sets - 10 reps - Seated Posterior Pelvic Tilt  - 1 x daily - 7 x weekly - 2 sets - 10 reps - 2 sec  hold - Bird Dog on Counter  - 3-4  x weekly - 3 sets - 10 reps  ASSESSMENT:  CLINICAL IMPRESSION: Pt presents for initial treatment for abdominal weakness in the setting of diastasis recti. She demonstrates significant deficits with social functioning, energy/fatigue, and physical functioning. She was able to perform all the exercises without an increased in her low back pain and without having increased and noticeable doming of abdominal musculature. She will benefit from skilled PT to address these aforementioned deficits to lift and carry her children without risk of injury to low back or worsening diastasis recti.   OBJECTIVE IMPAIRMENTS: decreased strength, impaired flexibility, and obesity.   ACTIVITY LIMITATIONS: carrying, lifting, bending, squatting, and caring for others  PARTICIPATION LIMITATIONS: cleaning, community activity, and yard work  PERSONAL FACTORS: 3+ comorbidities: Depression, anxiety and HTN  are also affecting patient's functional outcome.   REHAB POTENTIAL: Good  CLINICAL DECISION MAKING: Stable/uncomplicated  EVALUATION COMPLEXITY: Low   GOALS: Goals reviewed with patient? No  SHORT TERM GOALS: Target date: 05/31/2023  Patient will demonstrate undestanding of home exercise plan by performing exercises correctly with evidence of good carry over with min to no verbal or tactile cues .   Baseline: NT 05/21/23: Able to perform independently  Goal status: ONGOING   2.  Patient will be able to demonstrate understanding of transverse abdominis activation by performing posterior pelvic tilt in sitting with no cueing and without holding breath.   Baseline: Able to perform posterior pelvic tilt but requires holding breath Goal status: ONGOING     LONG TERM GOALS: Target date: 07/26/2023  Patient will demonstrate an improvement in SF-36 score by >=10% indicating a decrease in disability and improvement in overall function.  Baseline:  SF-36 -Physical Functioning: 20% -Role Limitations due to  physical health: 50% -Role Limitations due to emotional health: 33% -Energy/fatigue: 10% -Emotional well being: 32% -Social Functioning: 13% -Pain: 58% -General Health: 40% -Health Change: 50% Goal status: ONGOING   2.  Patient will improve abdominal strength by 1 level on Sahrmann test as evidence of improved core stability and mitigation of diastasis recti.  Baseline: Level 4   Goal status: ONGOING   3.  Patient will demonstrate understanding of how to utilize core stability and proper hip hinge technique while lifting objects that weight similarly to her 66 and 57 year old children or >=40 lbs from floor into standing.  Baseline: #40 KB lift increased lordosis with dead lift  Goal status: ONGOING    4.  Patient will improve hip strength by >=1/3  grade (ie 4 to 4-) for improved lumbar and pelvic stability   Baseline: Hip Ext R/L 4-/4-, Hip Abd R/L 4/4, Hip Add R/L 4-/4-  Goal status: ONGOING    PLAN:  PT FREQUENCY: 1-2x/week  PT DURATION: 10 weeks  PLANNED  INTERVENTIONS: 97164- PT Re-evaluation, 97110-Therapeutic exercises, 97530- Therapeutic activity, 97112- Neuromuscular re-education, [phone]- Self Care, 60454- Manual therapy, [phone]- Gait training, [phone]- Orthotic Fit/training, [phone]- Aquatic Therapy, Patient/Family education, Balance training, Stair training, Dry Needling, Joint mobilization, Joint manipulation, Spinal manipulation, Spinal mobilization, Vestibular training, DME instructions, Cryotherapy, and Moist heat.  PLAN FOR NEXT SESSION:  side planks on forearms and knee with hip elevation. hip hinge for improved biomechanics when lifting. TA activation with diaphragmatic breathing. Wall Sit while holding weight out front for increased TA activation.   Ellin Goodie PT, DPT  [facility] [address] [place], [address] Phone: [phone]   Fax:  [phone]

## 2023-05-23 ENCOUNTER — Ambulatory Visit: Admitting: Physical Therapy

## 2023-05-23 ENCOUNTER — Ambulatory Visit

## 2023-05-23 ENCOUNTER — Encounter

## 2023-05-23 DIAGNOSIS — M6208 Separation of muscle (nontraumatic), other site: Secondary | ICD-10-CM

## 2023-05-23 DIAGNOSIS — M546 Pain in thoracic spine: Secondary | ICD-10-CM

## 2023-05-23 DIAGNOSIS — M6281 Muscle weakness (generalized): Secondary | ICD-10-CM

## 2023-05-23 DIAGNOSIS — M5459 Other low back pain: Secondary | ICD-10-CM | POA: Diagnosis not present

## 2023-05-23 NOTE — Therapy (Signed)
 OUTPATIENT PHYSICAL THERAPY THORACOLUMBAR TREATMENT   Patient Name: Alexandra Heath MRN: [id number] DOB:10-22-1980, 43 y.o., female Today's Date: 05/23/2023  END OF SESSION:      PT End of Session - 05/23/23 1808     Visit Number 3    Number of Visits 20    Date for PT Re-Evaluation 07/26/23    Authorization Type [place] [employer] MCD    Authorization - Number of Visits 20    Progress Note Due on Visit 10    PT Start Time 1810    PT Stop Time 1850    PT Time Calculation (min) 40 min    Activity Tolerance Patient tolerated treatment well    Behavior During Therapy [facility] for tasks assessed/performed             Past Medical History:  Diagnosis Date   Epilepsy (HCC)    Fibroids 05/14/2015   Rapid heart beat    Scoliosis    Scoliosis    Tachycardia    Past Surgical History:  Procedure Laterality Date   CHOLECYSTECTOMY     GALLBLADDER SURGERY     TUBAL LIGATION N/A 12/02/2020   Procedure: POST PARTUM TUBAL LIGATION;  Surgeon: Suzy Bouchard, MD;  Location: [facility] ORS;  Service: Gynecology;  Laterality: N/A;   Patient Active Problem List   Diagnosis Date Noted   NSVD (normal spontaneous vaginal delivery) 12/03/2020   S/P tubal ligation 12/03/2020   Elevated blood-pressure reading without diagnosis of hypertension 12/26/2019   Encounter for supervision of high risk pregnancy due to advanced maternal age in primigravida 07/03/2019   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 Osborne Oman   REFERRING PROVIDER: Dr. Sung Amabile   REFERRING DIAG: M62.08 (ICD-10-CM) - Separation of muscle (nontraumatic), other site  Rationale for  Evaluation and Treatment: Rehabilitation  THERAPY DIAG:  Muscle weakness (generalized)  Diastasis recti  Other low back pain  Pain in thoracic spine  ONSET DATE: 10+ years ago   SUBJECTIVE:                                                                                                                                                                                           SUBJECTIVE STATEMENT: Patient reports continued pain in the lower back and left shoulder. 4/10 on NPS. No questions or concerns.  PERTINENT HISTORY:  Pt started experiencing diastasis recti around her third child in 2010. Since then she has been managing it on her own. She also has a umbilical hernia that was confirmed by Dr. Sung Amabile on March 19th, 2025. He gave her permission to initiate PT before exploring further interventions. She has not experienced increased pain or discomfort because of the hernia or diastasis recti.   PAIN:  Are you having pain? No  PRECAUTIONS: Avoid positions that exacerbate dooming in area inside diastasis recti like crunches or leg lifts.   RED FLAGS: None   WEIGHT BEARING RESTRICTIONS: No  FALLS:  Has patient fallen in last 6 months? No  LIVING ENVIRONMENT: Lives with: lives with their family Lives in: Did not ask  Stairs: Did not ask  Has following equipment at home: None  OCCUPATION: Did not ask   PLOF: Independent  PATIENT GOALS: To reduce diastasis recti    NEXT MD VISIT: Not sure   OBJECTIVE:  Note: Objective measures were completed at Evaluation unless otherwise noted.  VITALS BP 118/74 HR 80 SpO2 100   DIAGNOSTIC FINDINGS:  None listed   PATIENT SURVEYS:  None performed   COGNITION: Overall cognitive status: Within functional limits for tasks assessed     SENSATION: WFL  MUSCLE LENGTH: Hamstrings: Right NT deg; Left NT deg Thomas test: Not tested         Ely's Test: Positive Bilaterally   POSTURE: No Significant postural  limitations  PALPATION: No areas tender to palpate    LOWER EXTREMITY MMT:    MMT Right eval Left eval  Hip flexion 4+ 4+  Hip extension 4- 4-  Hip abduction 4 4  Hip adduction 4- 4-  Hip internal rotation    Hip external rotation    Knee flexion 4+ 4+  Knee extension 4+ 4+  Ankle dorsiflexion    Ankle plantarflexion    Ankle inversion    Ankle eversion     (Blank rows = not tested)   FUNCTIONAL TESTS:  Sahrmann Test: Level 4       TREATMENT DATE:   05/23/2023:   There.ex: Supine Bridge   2 x 10; VC for TrA activation with hip extension   Side Plank on Forearm and Knees:   2 x 5 x 10s hold  Bird Dog Supported on [employer] on mat table   1 x 10; VC for hip extension and less hip rotation  Initial Trial of Wall Sits with TrA activation:   2 x 10s; moderate fatigue in LE  There.act:  Standing Hip Flexion March Progression in order to improve core stability when navigating crowded floor and upward reaching. 1 x 10 with 1 Kg at chest 1 x 10 with 1 Kg Chest Press 1 x 10 with 3 Kg Alternating Shoulder Press   PT demo with pt return demo on proper hip hinge mechanics:  - Utilized Foam roller on thighs, BUE slide foam roller down to knees while maintaining neutral spine and knee flexion  1 x 10 with TrA activation  2 x 10 10# KB Squat and Reach in order to simulate picking up heavier object   05/21/23: THEREX  TM Warm up 1.0 mph for 5 min  OMEGA Palloff Press #10 3 x 10  Bird Dog against counter 2 x 10  -min VC to reach forward instead of out to side  Seated Anterior Pelvic Tilt with 2 sec hold 2 x 10   Physical Performance  SF-36 Assessment:  -  Physical Functioning: 20% -Role Limitations due to physical health: 50% -Role Limitations due to emotional health: 33% -Energy/fatigue: 10% -Emotional well being: 32% -Social Functioning: 13% -Pain: 58% -General Health: 40% -Health Change: 50%    05/16/23:                                                                                                                                THEREX   90/90 Supine Heel Touches 2 x 10  -dooming in diastasis recti with no symptoms Bird Dog 2 x 10    PATIENT EDUCATION:  Education details: Form and technique for appropriate exercise  Person educated: Patient Education method: Explanation, Demonstration, Verbal cues, and Handouts Education comprehension: verbalized understanding, returned demonstration, and verbal cues required  HOME EXERCISE PROGRAM: Access Code: [email or web address] URL: [email or web address] Date: 05/21/2023 Prepared by: Ellin Goodie  Exercises - Squat with Chair Touch  - 3-4 x weekly - 3 sets - 10 reps - Seated Posterior Pelvic Tilt  - 1 x daily - 7 x weekly - 2 sets - 10 reps - 2 sec  hold - Bird Dog on Counter  - 3-4 x weekly - 3 sets - 10 reps  ASSESSMENT:  CLINICAL IMPRESSION:  Pt presents to OPPT with continued focus on abdominal strengthening and core stabilization. Highly motivated and engaged throughout session. Pt tolerated all exercises without report of additional pain in lumbar spine. She performed proper hip hinge with minimal cueing from PT and was able to perform weighted squats without pain. Pt still has deficits in core stabilization, weakness in abdominal musculature and pain in lumbar spine. Based on today's performance the patient will benefit from skilled PT in order to address her deficits and facilitate lower risk with worsening diastasis recti.   OBJECTIVE IMPAIRMENTS: decreased strength, impaired flexibility, and obesity.   ACTIVITY LIMITATIONS: carrying, lifting, bending, squatting, and caring for others  PARTICIPATION LIMITATIONS: cleaning, community activity, and yard work  PERSONAL FACTORS: 3+ comorbidities: Depression, anxiety and HTN  are also affecting patient's functional outcome.   REHAB POTENTIAL: Good  CLINICAL DECISION MAKING: Stable/uncomplicated  EVALUATION COMPLEXITY:  Low   GOALS: Goals reviewed with patient? No  SHORT TERM GOALS: Target date: 05/31/2023  Patient will demonstrate undestanding of home exercise plan by performing exercises correctly with evidence of good carry over with min to no verbal or tactile cues .   Baseline: NT 05/21/23: Able to perform independently  Goal status: ONGOING   2.  Patient will be able to demonstrate understanding of transverse abdominis activation by performing posterior pelvic tilt in sitting with no cueing and without holding breath.   Baseline: Able to perform posterior pelvic tilt but requires holding breath Goal status: ONGOING     LONG TERM GOALS: Target date: 07/26/2023  Patient will demonstrate an improvement in SF-36 score by >=10% indicating a decrease in disability and improvement in overall function.  Baseline:  SF-36 -Physical Functioning: 20% -Role Limitations due to  physical health: 50% -Role Limitations due to emotional health: 33% -Energy/fatigue: 10% -Emotional well being: 32% -Social Functioning: 13% -Pain: 58% -General Health: 40% -Health Change: 50% Goal status: ONGOING   2.  Patient will improve abdominal strength by 1 level on Sahrmann test as evidence of improved core stability and mitigation of diastasis recti.  Baseline: Level 4   Goal status: ONGOING   3.  Patient will demonstrate understanding of how to utilize core stability and proper hip hinge technique while lifting objects that weight similarly to her 38 and 69 year old children or >=40 lbs from floor into standing.  Baseline: #40 KB lift increased lordosis with dead lift  Goal status: ONGOING    4.  Patient will improve hip strength by >=1/3  grade (ie 4 to 4-) for improved lumbar and pelvic stability   Baseline: Hip Ext R/L 4-/4-, Hip Abd R/L 4/4, Hip Add R/L 4-/4-  Goal status: ONGOING    PLAN:  PT FREQUENCY: 1-2x/week  PT DURATION: 10 weeks  PLANNED INTERVENTIONS: 97164- PT Re-evaluation, 97110-Therapeutic  exercises, 97530- Therapeutic activity, 97112- Neuromuscular re-education, 97535- Self Care, 78295- Manual therapy, [phone]- Gait training, [phone]- Orthotic Fit/training, [phone]- Aquatic Therapy, Patient/Family education, Balance training, Stair training, Dry Needling, Joint mobilization, Joint manipulation, Spinal manipulation, Spinal mobilization, Vestibular training, DME instructions, Cryotherapy, and Moist heat.  PLAN FOR NEXT SESSION:    Continue training hip hinge for improved biomechanics when lifting. Loading with proper hip hinge. TA activation with diaphragmatic breathing. Seated Yoga ball in order to activate TrA.    Christene Lye PT, DPT [facility] [address] [place], [address] Phone: [phone]   Fax:  [phone]

## 2023-05-29 ENCOUNTER — Ambulatory Visit

## 2023-05-29 DIAGNOSIS — M5459 Other low back pain: Secondary | ICD-10-CM | POA: Diagnosis not present

## 2023-05-29 DIAGNOSIS — M6208 Separation of muscle (nontraumatic), other site: Secondary | ICD-10-CM

## 2023-05-29 DIAGNOSIS — M6281 Muscle weakness (generalized): Secondary | ICD-10-CM | POA: Diagnosis not present

## 2023-05-29 DIAGNOSIS — M546 Pain in thoracic spine: Secondary | ICD-10-CM | POA: Diagnosis not present

## 2023-05-29 NOTE — Therapy (Signed)
 OUTPATIENT PHYSICAL THERAPY THORACOLUMBAR TREATMENT   Patient Name: Alexandra Heath MRN: [id number] DOB:29-Jun-1980, 43 y.o., female Today's Date: 05/29/2023  END OF SESSION:      PT End of Session - 05/29/23 1646     Visit Number 4    Number of Visits 20    Date for PT Re-Evaluation 07/26/23    Authorization Type [place] [employer] M[employer]    Authorization - Number of Visits 20    Progress Note Due on Visit 10    PT Start Time 1646    PT Stop Time 1730    PT Time Calculation (min) 44 min    Activity Tolerance Patient tolerated treatment well    Behavior During Therapy [facility] for tasks assessed/performed             Past Medical History:  Diagnosis Date   Epilepsy (HCC)    Fibroids 05/14/2015   Rapid heart beat    Scoliosis    Scoliosis    Tachycardia    Past Surgical History:  Procedure Laterality Date   CHOLECYSTECTOMY     GALLBLADDER SURGERY     TUBAL LIGATION N/A 12/02/2020   Procedure: POST PARTUM TUBAL LIGATION;  Surgeon: Carolynn Citrin, MD;  Location: [facility] ORS;  Service: Gynecology;  Laterality: N/A;   Patient Active Problem List   Diagnosis Date Noted   NSVD (normal spontaneous vaginal delivery) 12/03/2020   S/P tubal ligation 12/03/2020   Elevated blood-pressure reading without diagnosis of hypertension 12/26/2019   Encounter for supervision of high risk pregnancy due to advanced maternal age in primigravida 07/03/2019   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 Shaunna Delaware   REFERRING PROVIDER: Dr. Conrado Delay   REFERRING DIAG: M62.08 (ICD-10-CM) - Separation of muscle (nontraumatic), other site  Rationale for  Evaluation and Treatment: Rehabilitation  THERAPY DIAG:  Muscle weakness (generalized)  Diastasis recti  ONSET DATE: 10+ years ago   SUBJECTIVE:                                                                                                                                                                                           SUBJECTIVE STATEMENT: Patient reports continued pain in the lower back. 4/10 on NPS. No questions or concerns.   PERTINENT HISTORY:  Pt started experiencing diastasis recti around her third  child in 2010. Since then she has been managing it on her own. She also has a umbilical hernia that was confirmed by Dr. Conrado Delay on March 19th, 2025. He gave her permission to initiate PT before exploring further interventions. She has not experienced increased pain or discomfort because of the hernia or diastasis recti.   PAIN:  Are you having pain? No  PRECAUTIONS: Avoid positions that exacerbate dooming in area inside diastasis recti like crunches or leg lifts.   RED FLAGS: None   WEIGHT BEARING RESTRICTIONS: No  FALLS:  Has patient fallen in last 6 months? No  LIVING ENVIRONMENT: Lives with: lives with their family Lives in: Did not ask  Stairs: Did not ask  Has following equipment at home: None  OCCUPATION: Did not ask   PLOF: Independent  PATIENT GOALS: To reduce diastasis recti    NEXT MD VISIT: Not sure   OBJECTIVE:  Note: Objective measures were completed at Evaluation unless otherwise noted.  VITALS BP 118/74 HR 80 SpO2 100   DIAGNOSTIC FINDINGS:  None listed   PATIENT SURVEYS:  None performed   COGNITION: Overall cognitive status: Within functional limits for tasks assessed     SENSATION: WFL  MUSCLE LENGTH: Hamstrings: Right NT deg; Left NT deg Thomas test: Not tested         Ely's Test: Positive Bilaterally   POSTURE: No Significant postural limitations  PALPATION: No areas tender to palpate    LOWER EXTREMITY MMT:     MMT Right eval Left eval  Hip flexion 4+ 4+  Hip extension 4- 4-  Hip abduction 4 4  Hip adduction 4- 4-  Hip internal rotation    Hip external rotation    Knee flexion 4+ 4+  Knee extension 4+ 4+  Ankle dorsiflexion    Ankle plantarflexion    Ankle inversion    Ankle eversion     (Blank rows = not tested)   FUNCTIONAL TESTS:  Sahrmann Test: Level 4       TREATMENT DATE:   05/29/2023:   There.ex: Supine Bridge    2 x 10 x 3s hold; multimodal cue for hip extension and TA activation  Neutral Curl up With LLE straight  6 x 10s;  VC for TrA activation  Side Plank on knee (L sidelying):  4 x 10s/bout - VC for knee position  Standing pallof Press against Blue TB   1 x 10 PT added external perturbations with final 3 reps  1 x 10 Tall Kneeling Position   1 x 10 on airex   Time spent modifying and providing pt education on updated HEP with new exercises.   There.act (in order to simulate household activities with proper core stabilization and lifting mechanics):   Farmer's Carry: 2 x 16' with 20# KB in ea UE 2 x 16' with 30# KB in ea UE  Deep Squat with KB (Good carryover from last session):   1 x 10 10# KB  1 x 10 20# KB   - Seated rest break in b/t sets   Wall Sit in order to improve core stabilization:  4 x 10s against red yoga ball  - Seated rest break in b/t sets   Runners Pose with LLE into Hip flexion:   2 x 10   PT added external perturbations at wrist with final 3 repetitions   05/21/23: THEREX  TM Warm up 1.0 mph for 5 min  OMEGA Palloff Press #10 3 x 10  Bird  Dog against counter 2 x 10  -min VC to reach forward instead of out to side  Seated Anterior Pelvic Tilt with 2 sec hold 2 x 10   Physical Performance  SF-36 Assessment:  -Physical Functioning: 20% -Role Limitations due to physical health: 50% -Role Limitations due to emotional health: 33% -Energy/fatigue: 10% -Emotional well being: 32% -Social Functioning: 13% -Pain:  58% -General Health: 40% -Health Change: 50%    05/16/23:                                                                                                                               THEREX   90/90 Supine Heel Touches 2 x 10  -dooming in diastasis recti with no symptoms Bird Dog 2 x 10    PATIENT EDUCATION:  Education details: Form and technique for appropriate exercise, HEP  Person educated: Patient Education method: Programmer, multimedia, Demonstration, Verbal cues, and Handouts Education comprehension: verbalized understanding, returned demonstration, and verbal cues required  HOME EXERCISE PROGRAM: Access Code: [email or web address] URL: [email or web address] Date: 05/29/2023 Prepared by: Satira Curet  Exercises - Squat with Chair Touch  - 3-4 x weekly - 3 sets - 10 reps - Seated Posterior Pelvic Tilt  - 1 x daily - 7 x weekly - 2 sets - 10 reps - 2 sec  hold - Bird Dog on Counter  - 3-4 x weekly - 3 sets - 10 reps - Supine Bridge  - 1 x daily - 7 x weekly - 2-3 sets - 10 reps - Side Plank on Knees  - 1 x daily - 7 x weekly - 2-3 sets - 10 reps  Access Code: [email or web address] URL: [email or web address] Date: 05/21/2023 Prepared by: Marge Shed  Exercises - Squat with Chair Touch  - 3-4 x weekly - 3 sets - 10 reps - Seated Posterior Pelvic Tilt  - 1 x daily - 7 x weekly - 2 sets - 10 reps - 2 sec  hold - Bird Dog on Counter  - 3-4 x weekly - 3 sets - 10 reps  ASSESSMENT:  CLINICAL IMPRESSION: Pt arrived to OPPT with continued focus on abdominal stabilization and functional strengthening. Pt daughter present throughout session. She tolerated increase in intensity targeting on improving core stabilization without lumbar spine. No notable doming with neutral curl up exercises. Pt had good carryover from last session with hip hinge mechanics and able to perform proper squat with minimal cues from PT. Pt still has deficits in weakness in abdominal musculature and pain in  lumbar spine. Based on today's performance th patient will continue to benefit from skilled PT in order to address her current deficits and facilitate lower risk with worsening diastasis recti.   OBJECTIVE IMPAIRMENTS: decreased strength, impaired flexibility, and obesity.   ACTIVITY LIMITATIONS: carrying, lifting, bending, squatting, and caring for others  PARTICIPATION LIMITATIONS: cleaning, community activity, and yard work  PERSONAL FACTORS: 3+ comorbidities: Depression, anxiety and HTN  are also affecting patient's functional  outcome.   REHAB POTENTIAL: Good  CLINICAL DECISION MAKING: Stable/uncomplicated  EVALUATION COMPLEXITY: Low   GOALS: Goals reviewed with patient? No  SHORT TERM GOALS: Target date: 05/31/2023  Patient will demonstrate undestanding of home exercise plan by performing exercises correctly with evidence of good carry over with min to no verbal or tactile cues .   Baseline: NT 05/21/23: Able to perform independently  Goal status: ONGOING   2.  Patient will be able to demonstrate understanding of transverse abdominis activation by performing posterior pelvic tilt in sitting with no cueing and without holding breath.   Baseline: Able to perform posterior pelvic tilt but requires holding breath Goal status: ONGOING     LONG TERM GOALS: Target date: 07/26/2023  Patient will demonstrate an improvement in SF-36 score by >=10% indicating a decrease in disability and improvement in overall function.  Baseline:  SF-36 -Physical Functioning: 20% -Role Limitations due to physical health: 50% -Role Limitations due to emotional health: 33% -Energy/fatigue: 10% -Emotional well being: 32% -Social Functioning: 13% -Pain: 58% -General Health: 40% -Health Change: 50% Goal status: ONGOING   2.  Patient will improve abdominal strength by 1 level on Sahrmann test as evidence of improved core stability and mitigation of diastasis recti.  Baseline: Level 4   Goal status:  ONGOING   3.  Patient will demonstrate understanding of how to utilize core stability and proper hip hinge technique while lifting objects that weight similarly to her 66 and 47 year old children or >=40 lbs from floor into standing.  Baseline: #40 KB lift increased lordosis with dead lift  Goal status: ONGOING    4.  Patient will improve hip strength by >=1/3  grade (ie 4 to 4-) for improved lumbar and pelvic stability   Baseline: Hip Ext R/L 4-/4-, Hip Abd R/L 4/4, Hip Add R/L 4-/4-  Goal status: ONGOING    PLAN:  PT FREQUENCY: 1-2x/week  PT DURATION: 10 weeks  PLANNED INTERVENTIONS: 97164- PT Re-evaluation, 97110-Therapeutic exercises, 97530- Therapeutic activity, 97112- Neuromuscular re-education, 97535- Self Care, 16109- Manual therapy, [phone]- Gait training, [phone]- Orthotic Fit/training, [phone]- Aquatic Therapy, Patient/Family education, Balance training, Stair training, Dry Needling, Joint mobilization, Joint manipulation, Spinal manipulation, Spinal mobilization, Vestibular training, DME instructions, Cryotherapy, and Moist heat.  PLAN FOR NEXT SESSION:    Continue training hip hinge for improved biomechanics when lifting. Loading with proper hip hinge. TA activation with diaphragmatic breathing. Seated Yoga ball in order to activate TrA.    Satira Curet PT, DPT [facility] [address], [place], [address] Phone: [phone]   Fax:  [phone]

## 2023-05-30 IMAGING — MG MM DIGITAL SCREENING BILAT W/ TOMO AND CAD
8 series · 8 of 24 positions shown · non-contrast
Comparison: Previous exam(s).

CLINICAL DATA: Screening.

EXAM:
DIGITAL SCREENING BILATERAL MAMMOGRAM WITH TOMOSYNTHESIS AND CAD
TECHNIQUE: Bilateral screening digital craniocaudal and mediolateral oblique
mammograms were obtained. Bilateral screening digital breast
tomosynthesis was performed. The images were evaluated with
computer-aided detection.

[L MLO synth-2D]
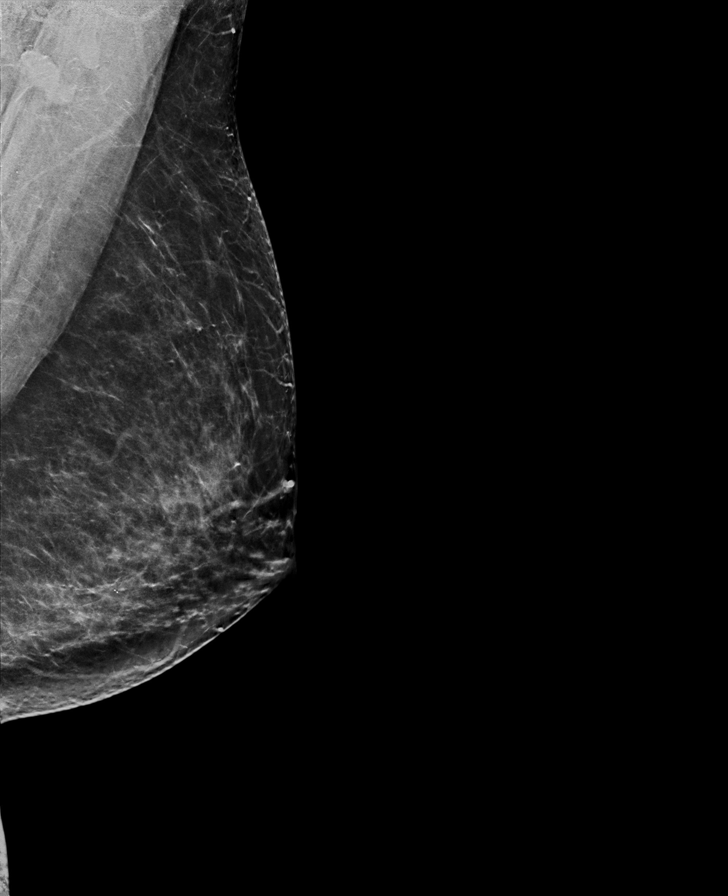

[R CC synth-2D]
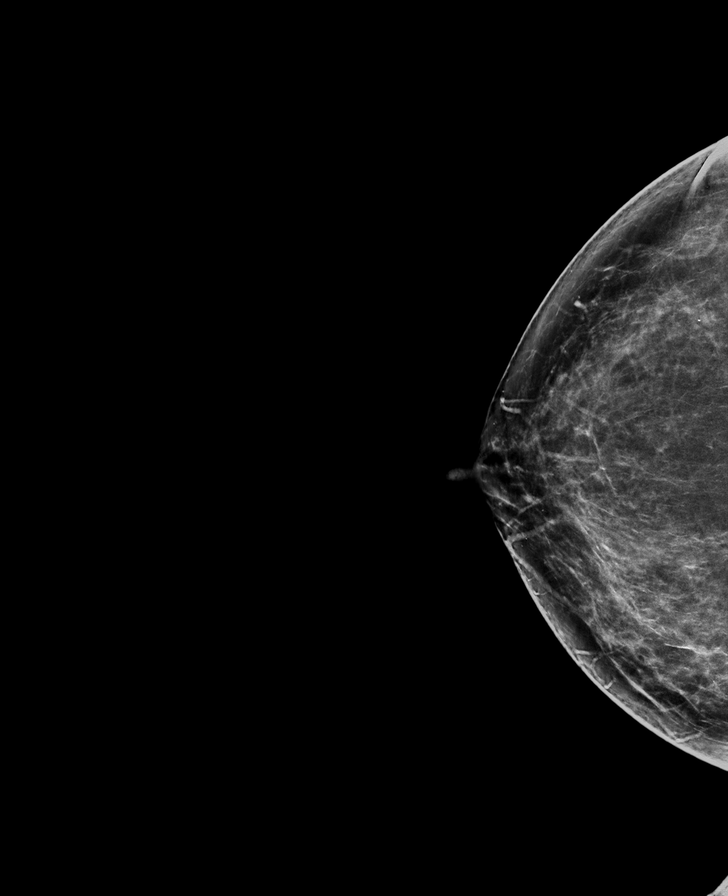

[R MLO synth-2D]
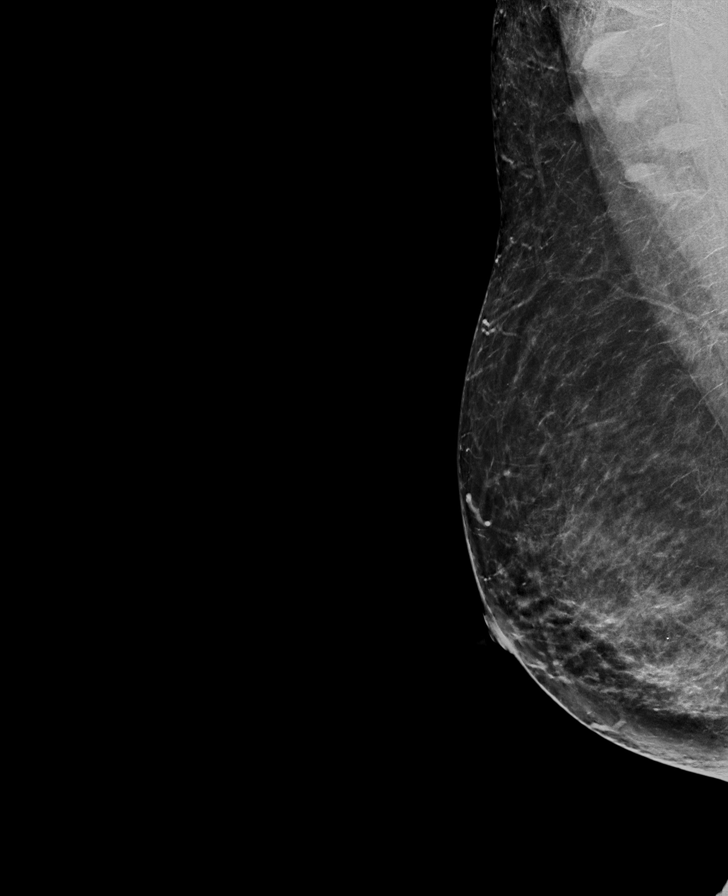

[L CC synth-2D]
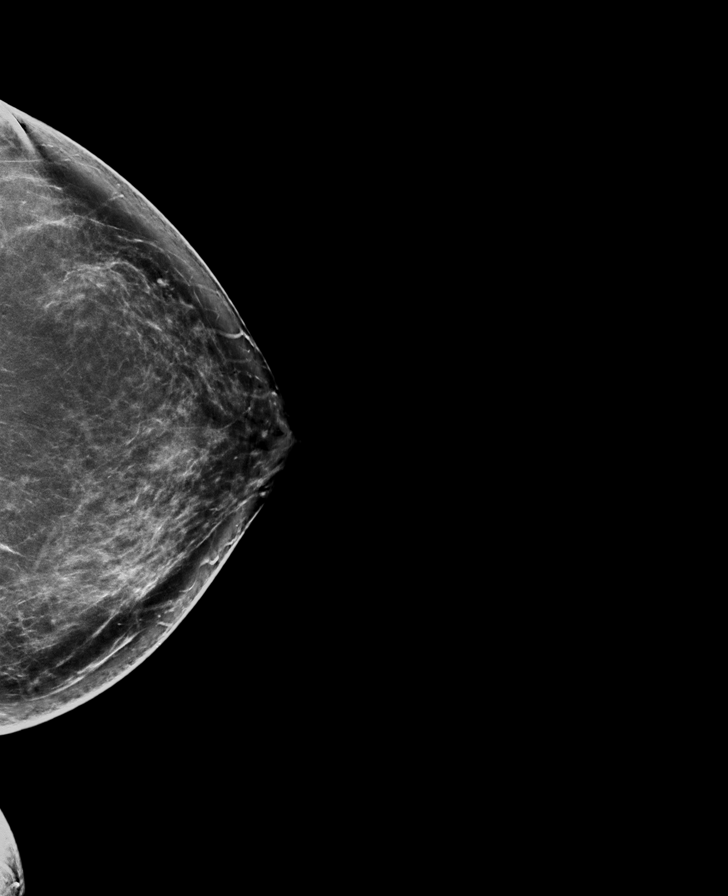

[L CC tomo · tomo slice 44/87.0]
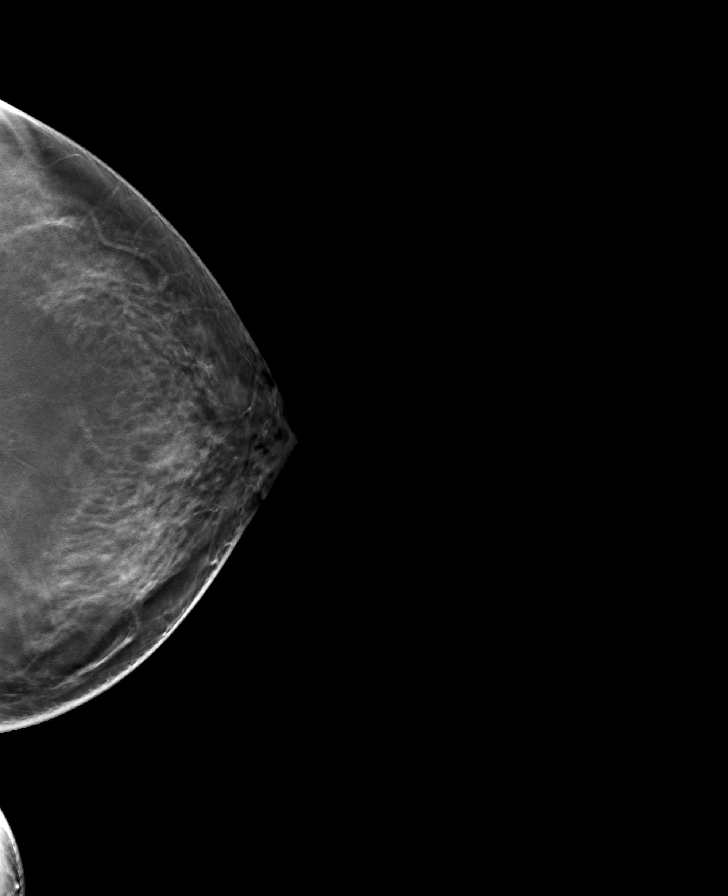

[R MLO tomo · tomo slice 44/87.0]
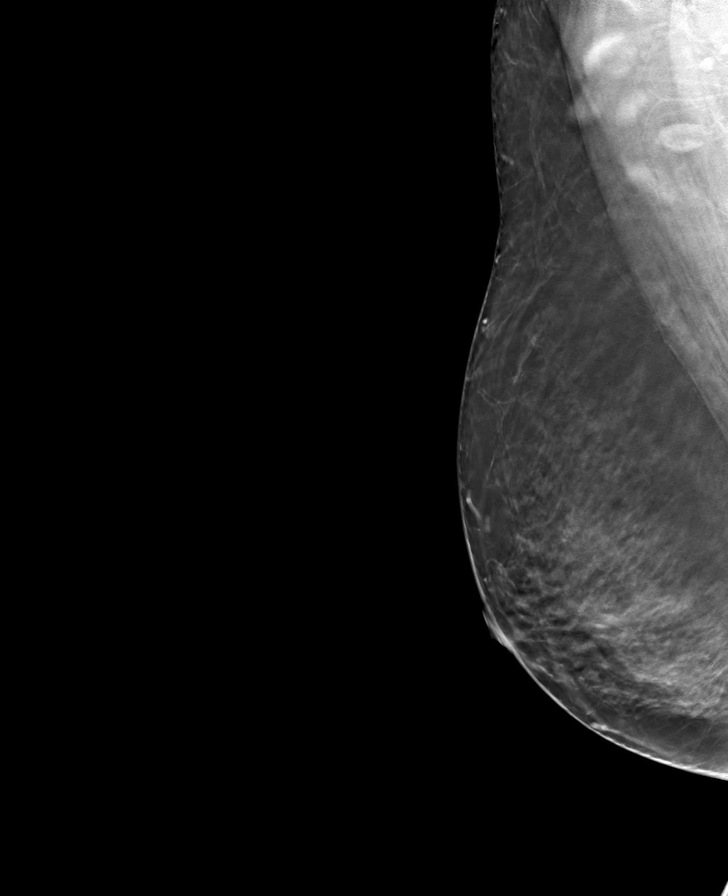

[L MLO tomo · tomo slice 41/82.0]
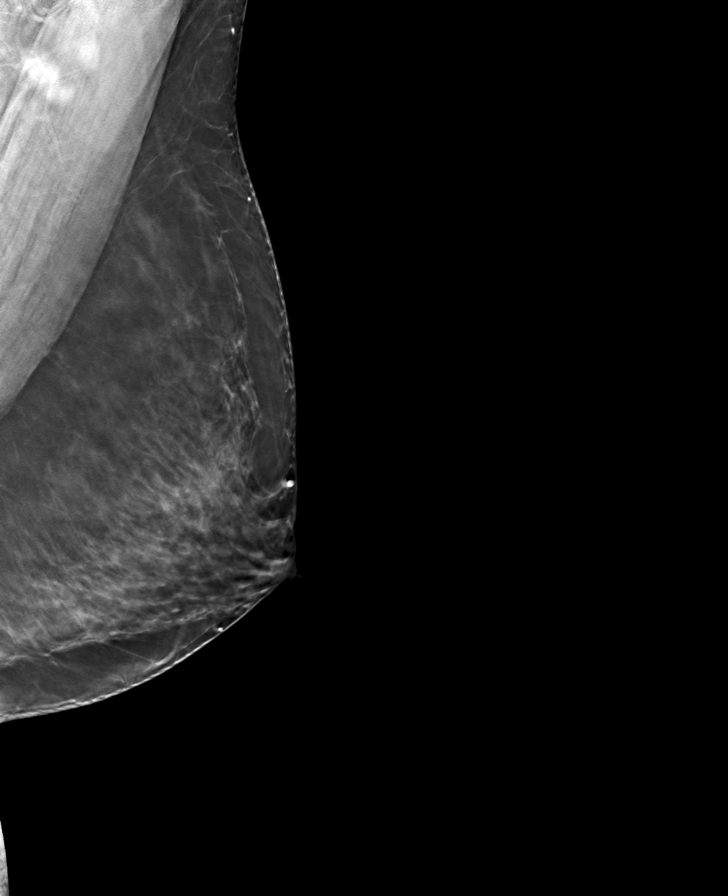

[R CC tomo · tomo slice 41/80.0]
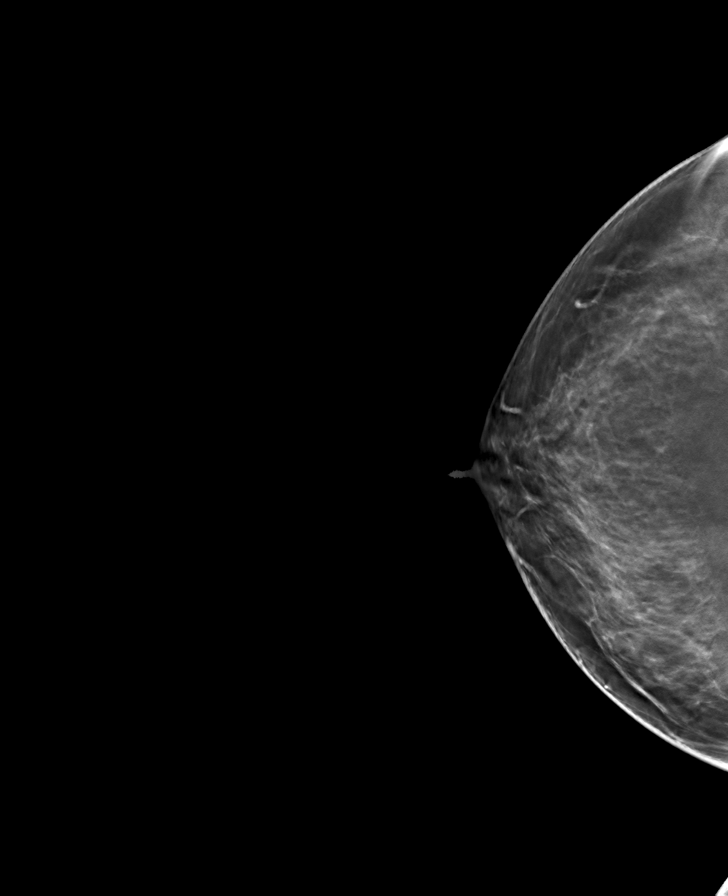

[8 of 24 positions shown; findings below may reference images not displayed]

ACR Breast Density Category c: The breast tissue is heterogeneously
dense, which may obscure small masses.
FINDINGS: There are no findings suspicious for malignancy.
IMPRESSION: No mammographic evidence of malignancy. A result letter of this
screening mammogram will be mailed directly to the patient.

RECOMMENDATION:
Screening mammogram in one year. (Code:Q3-W-BC3)

BI-RADS CATEGORY  1: Negative.

## 2023-05-31 ENCOUNTER — Ambulatory Visit

## 2023-05-31 ENCOUNTER — Encounter: Admitting: Physical Therapy

## 2023-06-05 ENCOUNTER — Encounter: Admitting: Physical Therapy

## 2023-06-12 ENCOUNTER — Ambulatory Visit

## 2023-06-12 DIAGNOSIS — M5459 Other low back pain: Secondary | ICD-10-CM

## 2023-06-12 DIAGNOSIS — M6208 Separation of muscle (nontraumatic), other site: Secondary | ICD-10-CM | POA: Diagnosis not present

## 2023-06-12 DIAGNOSIS — M6281 Muscle weakness (generalized): Secondary | ICD-10-CM

## 2023-06-12 DIAGNOSIS — M546 Pain in thoracic spine: Secondary | ICD-10-CM | POA: Diagnosis not present

## 2023-06-12 NOTE — Therapy (Signed)
 OUTPATIENT PHYSICAL THERAPY THORACOLUMBAR TREATMENT   Patient Name: Alexandra Heath MRN: [id number] DOB:1980/04/18, 43 y.o., female Today's Date: 06/12/2023  END OF SESSION:      PT End of Session - 06/12/23 1731     Visit Number 5    Number of Visits 20    Date for PT Re-Evaluation 07/26/23    Authorization Type [place] [employer] MCD    Authorization - Visit Number 5    Authorization - Number of Visits 20    Progress Note Due on Visit 10    PT Start Time 1730    PT Stop Time 1810    PT Time Calculation (min) 40 min    Activity Tolerance Patient tolerated treatment well    Behavior During Therapy [facility] for tasks assessed/performed             Past Medical History:  Diagnosis Date   Epilepsy (HCC)    Fibroids 05/14/2015   Rapid heart beat    Scoliosis    Scoliosis    Tachycardia    Past Surgical History:  Procedure Laterality Date   CHOLECYSTECTOMY     GALLBLADDER SURGERY     TUBAL LIGATION N/A 12/02/2020   Procedure: POST PARTUM TUBAL LIGATION;  Surgeon: Carolynn Citrin, MD;  Location: [facility] ORS;  Service: Gynecology;  Laterality: N/A;   Patient Active Problem List   Diagnosis Date Noted   NSVD (normal spontaneous vaginal delivery) 12/03/2020   S/P tubal ligation 12/03/2020   Elevated blood-pressure reading without diagnosis of hypertension 12/26/2019   Encounter for supervision of high risk pregnancy due to advanced maternal age in primigravida 07/03/2019   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 Shaunna [place]   REFERRING PROVIDER: Dr. Conrado Delay   REFERRING DIAG: M62.08 (ICD-10-CM) - Separation of muscle  (nontraumatic), other site  Rationale for Evaluation and Treatment: Rehabilitation  THERAPY DIAG:  Muscle weakness (generalized)  Diastasis recti  Other low back pain  Pain in thoracic spine  ONSET DATE: 10+ years ago   SUBJECTIVE:                                                                                                                                                                                           SUBJECTIVE STATEMENT: Patient reports continued pain in the lower back. 4/10 on  NPS. No questions or concerns.   PERTINENT HISTORY:  Pt started experiencing diastasis recti around her third child in 2010. Since then she has been managing it on her own. She also has a umbilical hernia that was confirmed by Dr. Conrado Delay on March 19th, 2025. He gave her permission to initiate PT before exploring further interventions. She has not experienced increased pain or discomfort because of the hernia or diastasis recti.   PAIN:  Are you having pain? No  PRECAUTIONS: Avoid positions that exacerbate dooming in area inside diastasis recti like crunches or leg lifts.   RED FLAGS: None   WEIGHT BEARING RESTRICTIONS: No  FALLS:  Has patient fallen in last 6 months? No  LIVING ENVIRONMENT: Lives with: lives with their family Lives in: Did not ask  Stairs: Did not ask  Has following equipment at home: None  OCCUPATION: Did not ask   PLOF: Independent  PATIENT GOALS: To reduce diastasis recti    NEXT MD VISIT: Not sure   OBJECTIVE:  Note: Objective measures were completed at Evaluation unless otherwise noted.  VITALS BP 118/74 HR 80 SpO2 100   DIAGNOSTIC FINDINGS:  None listed   PATIENT SURVEYS:  None performed   COGNITION: Overall cognitive status: Within functional limits for tasks assessed     SENSATION: WFL  MUSCLE LENGTH: Hamstrings: Right NT deg; Left NT deg Thomas test: Not tested         Ely's Test: Positive Bilaterally   POSTURE: No Significant  postural limitations  PALPATION: No areas tender to palpate    LOWER EXTREMITY MMT:    MMT Right eval Left eval  Hip flexion 4+ 4+  Hip extension 4- 4-  Hip abduction 4 4  Hip adduction 4- 4-  Hip internal rotation    Hip external rotation    Knee flexion 4+ 4+  Knee extension 4+ 4+  Ankle dorsiflexion    Ankle plantarflexion    Ankle inversion    Ankle eversion     (Blank rows = not tested)   FUNCTIONAL TESTS:  Sahrmann Test: Level 4       TREATMENT DATE:   06/12/2023:   There.ex: Supine Bridge Progression   1 x 10   1 x 10 with ball squeeze (green ball)   1 x 10 with anti-rotation (red TB resistance from R)   Side Plank on knee (R sidelying):  4 x 10s/bout - VC for knee position  1 x 10s/ bout with clamshell   Standing Pallof Press at [employer] machine   2 x 10 with 10#   Dead Bug   Static Position: 3 x 15s   Alternating LE: 1 x 10    There.act (in order to simulate household activities with proper core stabilization and lifting mechanics):   Standing Marches with Kettlebell in Single UE 2 x 10 20# RUE 2 x 10 30# RUE  Forward March with 20# KB in RUE (for optimizing core stabilization with lifting objects and walking)  2 x 16'    Deep Squat with KB (Good carryover from last session) (for optimizing core and proper technique in picking up children and heavier objects):   2 x 10 30#   1 x 10 20#    UE Diagonal with 2kg KB with hip flexion for improving core stabilization when performing dynamic lifting tasks at home.  1 x 10 UE R bottom > L top & R Hip Flexion  1 x 10 UE L bottom > R  Top & L Hip Flexion    05/21/23: THEREX  TM Warm up 1.0 mph for 5 min  OMEGA Palloff Press #10 3 x 10  Bird Dog against counter 2 x 10  -min VC to reach forward instead of out to side  Seated Anterior Pelvic Tilt with 2 sec hold 2 x 10   Physical Performance  SF-36 Assessment:  -Physical Functioning: 20% -Role Limitations due to physical health:  50% -Role Limitations due to emotional health: 33% -Energy/fatigue: 10% -Emotional well being: 32% -Social Functioning: 13% -Pain: 58% -General Health: 40% -Health Change: 50%    05/16/23:                                                                                                                               THEREX   90/90 Supine Heel Touches 2 x 10  -dooming in diastasis recti with no symptoms Bird Dog 2 x 10    PATIENT EDUCATION:  Education details: Form and technique for appropriate exercise, HEP  Person educated: Patient Education method: Programmer, multimedia, Demonstration, Verbal cues, and Handouts Education comprehension: verbalized understanding, returned demonstration, and verbal cues required  HOME EXERCISE PROGRAM: Access Code: [email or web address] URL: [email or web address] Date: 06/12/2023 Prepared by: Satira Curet  Exercises - Seated Posterior Pelvic Tilt  - 1 x daily - 7 x weekly - 2 sets - 10 reps - 2 sec  hold - Bird Dog on Counter  - 3-4 x weekly - 3 sets - 10 reps - Supine Bridge  - 1 x daily - 3-4 x weekly - 2-3 sets - 10 reps - Side Plank on Knees  - 1 x daily - 3-4 x weekly - 2-3 sets - 10 reps - Mini Squat  - 1 x daily - 3-4 x weekly - 2-3 sets - 10-12 reps - Supine Dead Bug with Leg Extension  - 1 x daily - 3-4 x weekly - 2-3 sets - 10-12 reps  Access Code: [email or web address] URL: [email or web address] Date: 05/29/2023 Prepared by: Satira Curet  Exercises - Squat with Chair Touch  - 3-4 x weekly - 3 sets - 10 reps - Seated Posterior Pelvic Tilt  - 1 x daily - 7 x weekly - 2 sets - 10 reps - 2 sec  hold - Bird Dog on Counter  - 3-4 x weekly - 3 sets - 10 reps - Supine Bridge  - 1 x daily - 7 x weekly - 2-3 sets - 10 reps - Side Plank on Knees  - 1 x daily - 7 x weekly - 2-3 sets - 10 reps  Access Code: [email or web address] URL: [email or web address] Date: 05/21/2023 Prepared by: Marge Shed  Exercises - Squat with Chair Touch  -  3-4 x weekly - 3 sets - 10 reps - Seated Posterior Pelvic Tilt  - 1 x daily - 7 x weekly - 2 sets - 10 reps - 2 sec  hold - Bird Dog on Counter  - 3-4  x weekly - 3 sets - 10 reps  ASSESSMENT:  CLINICAL IMPRESSION: Pt arrived to OPPT with continued focus on abdominal stabilization and functional strengthening. Great participation from patient with good carryover of bridge technique from last session. No notable doming throughout session with all core exercises. Additionally pt able to maintain core stabilization while performing SLS; minimal cues from PT for TrA activation and lower extremity tempo. Today Pt able to perform proper squat without cues from PT for hip hinge; tolerated lifting 30# KB with no low back or abdominal pain. Pt still has deficits in abdominal strength and intermittent low back pain. Based on her deficits, pt will continue to benefit from skilled PT in order to facilitate lower risk with worsening diastais recti.   OBJECTIVE IMPAIRMENTS: decreased strength, impaired flexibility, and obesity.   ACTIVITY LIMITATIONS: carrying, lifting, bending, squatting, and caring for others  PARTICIPATION LIMITATIONS: cleaning, community activity, and yard work  PERSONAL FACTORS: 3+ comorbidities: Depression, anxiety and HTN  are also affecting patient's functional outcome.   REHAB POTENTIAL: Good  CLINICAL DECISION MAKING: Stable/uncomplicated  EVALUATION COMPLEXITY: Low   GOALS: Goals reviewed with patient? No  SHORT TERM GOALS: Target date: 05/31/2023  Patient will demonstrate undestanding of home exercise plan by performing exercises correctly with evidence of good carry over with min to no verbal or tactile cues .   Baseline: NT 05/21/23: Able to perform independently  Goal status: ONGOING   2.  Patient will be able to demonstrate understanding of transverse abdominis activation by performing posterior pelvic tilt in sitting with no cueing and without holding breath.    Baseline: Able to perform posterior pelvic tilt but requires holding breath Goal status: ONGOING     LONG TERM GOALS: Target date: 07/26/2023  Patient will demonstrate an improvement in SF-36 score by >=10% indicating a decrease in disability and improvement in overall function.  Baseline:  SF-36 -Physical Functioning: 20% -Role Limitations due to physical health: 50% -Role Limitations due to emotional health: 33% -Energy/fatigue: 10% -Emotional well being: 32% -Social Functioning: 13% -Pain: 58% -General Health: 40% -Health Change: 50% Goal status: ONGOING   2.  Patient will improve abdominal strength by 1 level on Sahrmann test as evidence of improved core stability and mitigation of diastasis recti.  Baseline: Level 4   Goal status: ONGOING   3.  Patient will demonstrate understanding of how to utilize core stability and proper hip hinge technique while lifting objects that weight similarly to her 24 and 40 year old children or >=40 lbs from floor into standing.  Baseline: #40 KB lift increased lordosis with dead lift  Goal status: ONGOING    4.  Patient will improve hip strength by >=1/3  grade (ie 4 to 4-) for improved lumbar and pelvic stability   Baseline: Hip Ext R/L 4-/4-, Hip Abd R/L 4/4, Hip Add R/L 4-/4-  Goal status: ONGOING    PLAN:  PT FREQUENCY: 1-2x/week  PT DURATION: 10 weeks  PLANNED INTERVENTIONS: 97164- PT Re-evaluation, 97110-Therapeutic exercises, 97530- Therapeutic activity, 97112- Neuromuscular re-education, 97535- Self Care, [address]- Manual therapy, [phone]- Gait training, [phone]- Orthotic Fit/training, [phone]- Aquatic Therapy, Patient/Family education, Balance training, Stair training, Dry Needling, Joint mobilization, Joint manipulation, Spinal manipulation, Spinal mobilization, Vestibular training, DME instructions, Cryotherapy, and Moist heat.  PLAN FOR NEXT SESSION:    Continue training hip hinge for improved biomechanics when lifting. Loading with  proper hip hinge. TA activation with diaphragmatic breathing. Seated Yoga ball in order to activate TrA.  Satira Curet PT, DPT [facility] [address], [place], [address] Phone: [phone]   Fax:  [phone]

## 2023-06-14 ENCOUNTER — Encounter: Admitting: Physical Therapy

## 2023-06-19 ENCOUNTER — Ambulatory Visit: Attending: Surgery

## 2023-06-19 DIAGNOSIS — M6281 Muscle weakness (generalized): Secondary | ICD-10-CM | POA: Insufficient documentation

## 2023-06-19 DIAGNOSIS — M6208 Separation of muscle (nontraumatic), other site: Secondary | ICD-10-CM | POA: Diagnosis not present

## 2023-06-19 DIAGNOSIS — M5459 Other low back pain: Secondary | ICD-10-CM | POA: Insufficient documentation

## 2023-06-19 NOTE — Therapy (Signed)
 OUTPATIENT PHYSICAL THERAPY THORACOLUMBAR TREATMENT   Patient Name: Alexandra Heath MRN: [id number] DOB:1981-01-16, 43 y.o., female Today's Date: 06/19/2023  END OF SESSION:      PT End of Session - 06/19/23 1725     Visit Number 6    Number of Visits 20    Date for PT Re-Evaluation 07/26/23    Authorization Type [place] [employer] M[employer]    Authorization - Visit Number 6    Authorization - Number of Visits 20    Progress Note Due on Visit 10    PT Start Time 1725    PT Stop Time 1809    PT Time Calculation (min) 44 min    Activity Tolerance Patient tolerated treatment well    Behavior During Therapy [facility] for tasks assessed/performed             Past Medical History:  Diagnosis Date   Epilepsy (HCC)    Fibroids 05/14/2015   Rapid heart beat    Scoliosis    Scoliosis    Tachycardia    Past Surgical History:  Procedure Laterality Date   CHOLECYSTECTOMY     GALLBLADDER SURGERY     TUBAL LIGATION N/A 12/02/2020   Procedure: POST PARTUM TUBAL LIGATION;  Surgeon: Carolynn Citrin, MD;  Location: [facility] ORS;  Service: Gynecology;  Laterality: N/A;   Patient Active Problem List   Diagnosis Date Noted   NSVD (normal spontaneous vaginal delivery) 12/03/2020   S/P tubal ligation 12/03/2020   Elevated blood-pressure reading without diagnosis of hypertension 12/26/2019   Encounter for supervision of high risk pregnancy due to advanced maternal age in primigravida 07/03/2019   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 Shaunna [place]   REFERRING PROVIDER: Dr. Conrado Delay   REFERRING DIAG: M62.08 (ICD-10-CM) - Separation of muscle  (nontraumatic), other site  Rationale for Evaluation and Treatment: Rehabilitation  THERAPY DIAG:  Muscle weakness (generalized)  Other low back pain  Diastasis recti  ONSET DATE: 10+ years ago   SUBJECTIVE:                                                                                                                                                                                           SUBJECTIVE STATEMENT: Patient report 5/10 in abdominal on NPS. Pain since yesterday, she reports that she had  to pick up child.   No questions or concerns.   PERTINENT HISTORY:  Pt started experiencing diastasis recti around her third child in 2010. Since then she has been managing it on her own. She also has a umbilical hernia that was confirmed by Dr. Conrado Delay on March 19th, 2025. He gave her permission to initiate PT before exploring further interventions. She has not experienced increased pain or discomfort because of the hernia or diastasis recti.   PAIN:  Are you having pain? No  PRECAUTIONS: Avoid positions that exacerbate dooming in area inside diastasis recti like crunches or leg lifts.   RED FLAGS: None   WEIGHT BEARING RESTRICTIONS: No  FALLS:  Has patient fallen in last 6 months? No  LIVING ENVIRONMENT: Lives with: lives with their family Lives in: Did not ask  Stairs: Did not ask  Has following equipment at home: None  OCCUPATION: Did not ask   PLOF: Independent  PATIENT GOALS: To reduce diastasis recti    NEXT MD VISIT: Not sure   OBJECTIVE:  Note: Objective measures were completed at Evaluation unless otherwise noted.  VITALS BP 118/74 HR 80 SpO2 100   DIAGNOSTIC FINDINGS:  None listed   PATIENT SURVEYS:  None performed   COGNITION: Overall cognitive status: Within functional limits for tasks assessed     SENSATION: WFL  MUSCLE LENGTH: Hamstrings: Right NT deg; Left NT deg Thomas test: Not tested         Ely's Test: Positive Bilaterally    POSTURE: No Significant postural limitations  PALPATION: No areas tender to palpate    LOWER EXTREMITY MMT:    MMT Right eval Left eval  Hip flexion 4+ 4+  Hip extension 4- 4-  Hip abduction 4 4  Hip adduction 4- 4-  Hip internal rotation    Hip external rotation    Knee flexion 4+ 4+  Knee extension 4+ 4+  Ankle dorsiflexion    Ankle plantarflexion    Ankle inversion    Ankle eversion     (Blank rows = not tested)   FUNCTIONAL TESTS:  Sahrmann Test: Level 4       TREATMENT DATE:   06/19/2023   There.ex: Supine Bridge Progression;   1 x 10 x 5s  2 x 10 with anti-rotation (green TB resistance from L)   Side Plank on knee (L sidelying):  4 x 10s/bout   Dead Bug   Static Position: 3 x 15s   Alternating LE against PT resistance (at foot): 3 x 10   There.act (in order to simulate household activities with proper core stabilization and lifting mechanics):   Standing Alternating Marches with Kettlebell    1 x 10 10# RUE  2 x 10 10# BUE  (Seated Rest Break in b/t sets)  [employer] with [employer]  2 x 25' 30#  2 x 25' 40#  (Seated Rest Break in b/t sets)   Wall Sit with Anti Rotation (Resistance from L) 3 x 15s, Blue TB    Standing on Reverse Bosu with Pallof Press  3 x 15 reps, Blue TB   Deep Squat with KB (Good carryover from last session) (for optimizing core and proper technique in picking up children and heavier objects):   2 x 10 30#   (Seated Rest Break in b/t sets)  05/21/23: THEREX  TM Warm up 1.0 mph for 5 min  OMEGA Palloff Press #10 3 x 10  Bird Dog against counter 2 x 10  -min  VC to reach forward instead of out to side  Seated Anterior Pelvic Tilt with 2 sec hold 2 x 10   Physical Performance  SF-36 Assessment:  -Physical Functioning: 20% -Role Limitations due to physical health: 50% -Role Limitations due to emotional health: 33% -Energy/fatigue: 10% -Emotional well being: 32% -Social Functioning: 13% -Pain:  58% -General Health: 40% -Health Change: 50%    05/16/23:                                                                                                                               THEREX   90/90 Supine Heel Touches 2 x 10  -dooming in diastasis recti with no symptoms Bird Dog 2 x 10    PATIENT EDUCATION:  Education details: Form and technique for appropriate exercise, HEP  Person educated: Patient Education method: Programmer, multimedia, Demonstration, Verbal cues, and Handouts Education comprehension: verbalized understanding, returned demonstration, and verbal cues required  HOME EXERCISE PROGRAM: Access Code: [email or web address] URL: [email or web address] Date: 06/12/2023 Prepared by: Satira Curet  Exercises - Seated Posterior Pelvic Tilt  - 1 x daily - 7 x weekly - 2 sets - 10 reps - 2 sec  hold - Bird Dog on Counter  - 3-4 x weekly - 3 sets - 10 reps - Supine Bridge  - 1 x daily - 3-4 x weekly - 2-3 sets - 10 reps - Side Plank on Knees  - 1 x daily - 3-4 x weekly - 2-3 sets - 10 reps - Mini Squat  - 1 x daily - 3-4 x weekly - 2-3 sets - 10-12 reps - Supine Dead Bug with Leg Extension  - 1 x daily - 3-4 x weekly - 2-3 sets - 10-12 reps  Access Code: [email or web address] URL: [email or web address] Date: 05/29/2023 Prepared by: Satira Curet  Exercises - Squat with Chair Touch  - 3-4 x weekly - 3 sets - 10 reps - Seated Posterior Pelvic Tilt  - 1 x daily - 7 x weekly - 2 sets - 10 reps - 2 sec  hold - Bird Dog on Counter  - 3-4 x weekly - 3 sets - 10 reps - Supine Bridge  - 1 x daily - 7 x weekly - 2-3 sets - 10 reps - Side Plank on Knees  - 1 x daily - 7 x weekly - 2-3 sets - 10 reps  Access Code: [email or web address] URL: [email or web address] Date: 05/21/2023 Prepared by: Marge Shed  Exercises - Squat with Chair Touch  - 3-4 x weekly - 3 sets - 10 reps - Seated Posterior Pelvic Tilt  - 1 x daily - 7 x weekly - 2 sets - 10 reps - 2 sec  hold - Bird Dog  on Counter  - 3-4 x weekly - 3 sets - 10 reps  ASSESSMENT:  CLINICAL IMPRESSION: Pt arrived to OPPT with family and continued focus on abdominal stabilization and functional strengthening. Great participation throughout session with no notable doming or  exacerbation of abdominal pain. PT increased intensity with interventions; added antirotational component with exercises in order for additional abdominal engagement. Today she demonstrated ability to lift 40# kettlebell with proper hip hinge and squat mechanics; no lower back pain. Pt still has deficits in abdominal strength and intermittent low back pain. Based on her deficits, pt will continue to benefit from skilled PT in order to facilitate lower risk with worsening diastais recti.   OBJECTIVE IMPAIRMENTS: decreased strength, impaired flexibility, and obesity.   ACTIVITY LIMITATIONS: carrying, lifting, bending, squatting, and caring for others  PARTICIPATION LIMITATIONS: cleaning, community activity, and yard work  PERSONAL FACTORS: 3+ comorbidities: Depression, anxiety and HTN  are also affecting patient's functional outcome.   REHAB POTENTIAL: Good  CLINICAL DECISION MAKING: Stable/uncomplicated  EVALUATION COMPLEXITY: Low   GOALS: Goals reviewed with patient? No  SHORT TERM GOALS: Target date: 05/31/2023  Patient will demonstrate undestanding of home exercise plan by performing exercises correctly with evidence of good carry over with min to no verbal or tactile cues .   Baseline: NT 05/21/23: Able to perform independently  Goal status: ONGOING   2.  Patient will be able to demonstrate understanding of transverse abdominis activation by performing posterior pelvic tilt in sitting with no cueing and without holding breath.   Baseline: Able to perform posterior pelvic tilt but requires holding breath Goal status: ONGOING     LONG TERM GOALS: Target date: 07/26/2023  Patient will demonstrate an improvement in SF-36 score by  >=10% indicating a decrease in disability and improvement in overall function.  Baseline:  SF-36 -Physical Functioning: 20% -Role Limitations due to physical health: 50% -Role Limitations due to emotional health: 33% -Energy/fatigue: 10% -Emotional well being: 32% -Social Functioning: 13% -Pain: 58% -General Health: 40% -Health Change: 50% Goal status: ONGOING   2.  Patient will improve abdominal strength by 1 level on Sahrmann test as evidence of improved core stability and mitigation of diastasis recti.  Baseline: Level 4   Goal status: ONGOING   3.  Patient will demonstrate understanding of how to utilize core stability and proper hip hinge technique while lifting objects that weight similarly to her 71 and 39 year old children or >=40 lbs from floor into standing.  Baseline: #40 KB lift increased lordosis with dead lift  Goal status: ONGOING    4.  Patient will improve hip strength by >=1/3  grade (ie 4 to 4-) for improved lumbar and pelvic stability   Baseline: Hip Ext R/L 4-/4-, Hip Abd R/L 4/4, Hip Add R/L 4-/4-  Goal status: ONGOING    PLAN:  PT FREQUENCY: 1-2x/week  PT DURATION: 10 weeks  PLANNED INTERVENTIONS: 97164- PT Re-evaluation, 97110-Therapeutic exercises, 97530- Therapeutic activity, 97112- Neuromuscular re-education, 97535- Self Care, 04540- Manual therapy, [phone]- Gait training, [phone]- Orthotic Fit/training, [phone]- Aquatic Therapy, Patient/Family education, Balance training, Stair training, Dry Needling, Joint mobilization, Joint manipulation, Spinal manipulation, Spinal mobilization, Vestibular training, DME instructions, Cryotherapy, and Moist heat.  PLAN FOR NEXT SESSION:    Continue training hip hinge for improved biomechanics when lifting. Loading with proper hip hinge. TA activation with diaphragmatic breathing. Seated Yoga ball in order to activate TrA.    Satira Curet PT, DPT [facility] [address], [place], [address] Phone: [phone]   Fax:  [phone]

## 2023-06-21 ENCOUNTER — Ambulatory Visit

## 2023-06-21 DIAGNOSIS — M6208 Separation of muscle (nontraumatic), other site: Secondary | ICD-10-CM

## 2023-06-21 DIAGNOSIS — M6281 Muscle weakness (generalized): Secondary | ICD-10-CM | POA: Diagnosis not present

## 2023-06-21 DIAGNOSIS — M5459 Other low back pain: Secondary | ICD-10-CM

## 2023-06-21 NOTE — Therapy (Signed)
 OUTPATIENT PHYSICAL THERAPY THORACOLUMBAR TREATMENT   Patient Name: Alexandra Heath MRN: [id number] DOB:Mar 02, 1980, 43 y.o., female Today's Date: 06/21/2023  END OF SESSION:      PT End of Session - 06/21/23 1730     Visit Number 7    Number of Visits 20    Date for PT Re-Evaluation 07/26/23    Authorization Type [place] [employer]    Authorization - Number of Visits 20    Progress Note Due on Visit 10    PT Start Time 1730    PT Stop Time 1810    PT Time Calculation (min) 40 min    Activity Tolerance Patient tolerated treatment well    Behavior During Therapy [facility] for tasks assessed/performed             Past Medical History:  Diagnosis Date   Epilepsy (HCC)    Fibroids 05/14/2015   Rapid heart beat    Scoliosis    Scoliosis    Tachycardia    Past Surgical History:  Procedure Laterality Date   CHOLECYSTECTOMY     GALLBLADDER SURGERY     TUBAL LIGATION N/A 12/02/2020   Procedure: POST PARTUM TUBAL LIGATION;  Surgeon: Carolynn Citrin, MD;  Location: [facility] ORS;  Service: Gynecology;  Laterality: N/A;   Patient Active Problem List   Diagnosis Date Noted   NSVD (normal spontaneous vaginal delivery) 12/03/2020   S/P tubal ligation 12/03/2020   Elevated blood-pressure reading without diagnosis of hypertension 12/26/2019   Encounter for supervision of high risk pregnancy due to advanced maternal age in primigravida 07/03/2019   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 Shaunna [place]   REFERRING PROVIDER: Dr. Conrado Delay   REFERRING DIAG: M62.08 (ICD-10-CM) - Separation of muscle (nontraumatic), other site  Rationale for  Evaluation and Treatment: Rehabilitation  THERAPY DIAG:  Muscle weakness (generalized)  Other low back pain  Diastasis recti  ONSET DATE: 10+ years ago   SUBJECTIVE:                                                                                                                                                                                           SUBJECTIVE STATEMENT: Patient reports 0/10 pain; pt endorses loss of 6lbs in total weight.  No questions or concerns.   PERTINENT HISTORY:  Pt  started experiencing diastasis recti around her third child in 2010. Since then she has been managing it on her own. She also has a umbilical hernia that was confirmed by Dr. Conrado Delay on March 19th, 2025. He gave her permission to initiate PT before exploring further interventions. She has not experienced increased pain or discomfort because of the hernia or diastasis recti.   PAIN:  Are you having pain? No  PRECAUTIONS: Avoid positions that exacerbate dooming in area inside diastasis recti like crunches or leg lifts.   RED FLAGS: None   WEIGHT BEARING RESTRICTIONS: No  FALLS:  Has patient fallen in last 6 months? No  LIVING ENVIRONMENT: Lives with: lives with their family Lives in: Did not ask  Stairs: Did not ask  Has following equipment at home: None  OCCUPATION: Did not ask   PLOF: Independent  PATIENT GOALS: To reduce diastasis recti    NEXT MD VISIT: Not sure   OBJECTIVE:  Note: Objective measures were completed at Evaluation unless otherwise noted.  VITALS BP 118/74 HR 80 SpO2 100   DIAGNOSTIC FINDINGS:  None listed   PATIENT SURVEYS:  None performed   COGNITION: Overall cognitive status: Within functional limits for tasks assessed     SENSATION: WFL  MUSCLE LENGTH: Hamstrings: Right NT deg; Left NT deg Thomas test: Not tested         Ely's Test: Positive Bilaterally   POSTURE: No Significant postural limitations  PALPATION: No areas tender to palpate     LOWER EXTREMITY MMT:    MMT Right eval Left eval  Hip flexion 4+ 4+  Hip extension 4- 4-  Hip abduction 4 4  Hip adduction 4- 4-  Hip internal rotation    Hip external rotation    Knee flexion 4+ 4+  Knee extension 4+ 4+  Ankle dorsiflexion    Ankle plantarflexion    Ankle inversion    Ankle eversion     (Blank rows = not tested)   FUNCTIONAL TESTS:  Sahrmann Test: Level 4       TREATMENT DATE: 06/21/2023   Thereapeutic Exercise (Focused on core stabilization):  Pallof Press while seated on White Yoga Ball  3 x 10 reps with Green TB  Supine Bridge Progression;   3 x 10 with anti-rotation (green TB resistance from R)   Dead Bug for core strengthening  Static Position: 3 x 30 s   Overhead B shoulder Flex against Red TB   3 x 10   Standing on Reverse Bosu with Ball Catch/Toss from PT  1 x 10 Ball Toss (green ball)   1 x 10 2Kg Med Ball  1 x 10 3Kg Med Ball    - Notable Hip Righting Reactions; able to maintain balance on reverse bosu while catching object from different directions     There.act (in order to simulate household activities with proper core stabilization and lifting mechanics):   Sit to Stand with Dumbbell (BUE support)  2 x 10 4#, Red TB around Knees to reduce knee valgus  1 x 10 5# plate Red TB around Knees to reduce knee valgus  Hip Flexion March with Overhead Press (5# Plate)  1 x 10 Red TB around Feet  2 x 10 Green TB around Feet   Farmers Carry with Kettlebell  2 x 33'  40#  2 x 33'  40# added 3 hurdles (Small)    05/21/23: THEREX  TM Warm up 1.0 mph for 5 min  OMEGA Palloff Press #  10 3 x 10  Bird Dog against counter 2 x 10  -min VC to reach forward instead of out to side  Seated Anterior Pelvic Tilt with 2 sec hold 2 x 10   Physical Performance  SF-36 Assessment:  -Physical Functioning: 20% -Role Limitations due to physical health: 50% -Role Limitations due to emotional health: 33% -Energy/fatigue: 10% -Emotional well  being: 32% -Social Functioning: 13% -Pain: 58% -General Health: 40% -Health Change: 50%    05/16/23:                                                                                                                               THEREX   90/90 Supine Heel Touches 2 x 10  -dooming in diastasis recti with no symptoms Bird Dog 2 x 10    PATIENT EDUCATION:  Education details: Form and technique for appropriate exercise, HEP  Person educated: Patient Education method: Programmer, multimedia, Demonstration, Verbal cues, and Handouts Education comprehension: verbalized understanding, returned demonstration, and verbal cues required  HOME EXERCISE PROGRAM: Access Code: [email or web address] URL: [email or web address] Date: 06/12/2023 Prepared by: Satira Curet  Exercises - Seated Posterior Pelvic Tilt  - 1 x daily - 7 x weekly - 2 sets - 10 reps - 2 sec  hold - Bird Dog on Counter  - 3-4 x weekly - 3 sets - 10 reps - Supine Bridge  - 1 x daily - 3-4 x weekly - 2-3 sets - 10 reps - Side Plank on Knees  - 1 x daily - 3-4 x weekly - 2-3 sets - 10 reps - Mini Squat  - 1 x daily - 3-4 x weekly - 2-3 sets - 10-12 reps - Supine Dead Bug with Leg Extension  - 1 x daily - 3-4 x weekly - 2-3 sets - 10-12 reps  Access Code: [email or web address] URL: [email or web address] Date: 05/29/2023 Prepared by: Satira Curet  Exercises - Squat with Chair Touch  - 3-4 x weekly - 3 sets - 10 reps - Seated Posterior Pelvic Tilt  - 1 x daily - 7 x weekly - 2 sets - 10 reps - 2 sec  hold - Bird Dog on Counter  - 3-4 x weekly - 3 sets - 10 reps - Supine Bridge  - 1 x daily - 7 x weekly - 2-3 sets - 10 reps - Side Plank on Knees  - 1 x daily - 7 x weekly - 2-3 sets - 10 reps  Access Code: [email or web address] URL: [email or web address] Date: 05/21/2023 Prepared by: Marge Shed  Exercises - Squat with Chair Touch  - 3-4 x weekly - 3 sets - 10 reps - Seated Posterior Pelvic Tilt  - 1 x daily - 7 x weekly -  2 sets - 10 reps - 2 sec  hold - Bird Dog on Counter  - 3-4 x weekly - 3 sets - 10 reps  ASSESSMENT:  CLINICAL IMPRESSION: Continued PT POC with focus on abdominal stabilization and functional  strength. Great participation throughout session; no reported abdominal or low back pain with interventions. PT challenged core stability with additional balance exercises using reverse bosu. Continued to progress functional strength with 40# KB for longer distances. She presented with minor knee valgus and poor carryover of squatting mechanics from last session. Mechanics improved with multimodal cues from PT. Pt still reports intermittent lumbar/abdominal pain and presents with deficits in abdominal strength. Based on her deficits, pt will continue to benefit from skilled PT in order to facilitate lower risk with worsen diastasis recti.   OBJECTIVE IMPAIRMENTS: decreased strength, impaired flexibility, and obesity.   ACTIVITY LIMITATIONS: carrying, lifting, bending, squatting, and caring for others  PARTICIPATION LIMITATIONS: cleaning, community activity, and yard work  PERSONAL FACTORS: 3+ comorbidities: Depression, anxiety and HTN are also affecting patient's functional outcome.   REHAB POTENTIAL: Good  CLINICAL DECISION MAKING: Stable/uncomplicated  EVALUATION COMPLEXITY: Low   GOALS: Goals reviewed with patient? No  SHORT TERM GOALS: Target date: 05/31/2023  Patient will demonstrate undestanding of home exercise plan by performing exercises correctly with evidence of good carry over with min to no verbal or tactile cues .   Baseline: NT 05/21/23: Able to perform independently  Goal status: ONGOING   2.  Patient will be able to demonstrate understanding of transverse abdominis activation by performing posterior pelvic tilt in sitting with no cueing and without holding breath.   Baseline: Able to perform posterior pelvic tilt but requires holding breath Goal status: ONGOING     LONG TERM  GOALS: Target date: 07/26/2023  Patient will demonstrate an improvement in SF-36 score by >=10% indicating a decrease in disability and improvement in overall function.  Baseline:  SF-36 -Physical Functioning: 20% -Role Limitations due to physical health: 50% -Role Limitations due to emotional health: 33% -Energy/fatigue: 10% -Emotional well being: 32% -Social Functioning: 13% -Pain: 58% -General Health: 40% -Health Change: 50% Goal status: ONGOING   2.  Patient will improve abdominal strength by 1 level on Sahrmann test as evidence of improved core stability and mitigation of diastasis recti.  Baseline: Level 4   Goal status: ONGOING   3.  Patient will demonstrate understanding of how to utilize core stability and proper hip hinge technique while lifting objects that weight similarly to her 14 and 89 year old children or >=40 lbs from floor into standing.  Baseline: #40 KB lift increased lordosis with dead lift  Goal status: ONGOING    4.  Patient will improve hip strength by >=1/3  grade (ie 4 to 4-) for improved lumbar and pelvic stability   Baseline: Hip Ext R/L 4-/4-, Hip Abd R/L 4/4, Hip Add R/L 4-/4-  Goal status: ONGOING    PLAN:  PT FREQUENCY: 1-2x/week  PT DURATION: 10 weeks  PLANNED INTERVENTIONS: 97164- PT Re-evaluation, 97110-Therapeutic exercises, 97530- Therapeutic activity, 97112- Neuromuscular re-education, 97535- Self Care, 14782- Manual therapy, [phone]- Gait training, [phone]- Orthotic Fit/training, [phone]- Aquatic Therapy, Patient/Family education, Balance training, Stair training, Dry Needling, Joint mobilization, Joint manipulation, Spinal manipulation, Spinal mobilization, Vestibular training, DME instructions, Cryotherapy, and Moist heat.  PLAN FOR NEXT SESSION:    Continue training hip hinge for improved biomechanics when lifting. Loading with proper hip hinge. TA activation with diaphragmatic breathing. Seated Yoga ball in order to activate TrA.     Satira Curet PT, DPT [facility] [address] [place], [address] Phone: [phone]   Fax:  [phone]

## 2023-06-26 ENCOUNTER — Ambulatory Visit

## 2023-06-26 ENCOUNTER — Telehealth: Payer: Self-pay

## 2023-06-26 ENCOUNTER — Encounter: Admitting: Physical Therapy

## 2023-06-26 NOTE — Telephone Encounter (Signed)
 Called patient about missed appt. Pt reports mixed up in schedule times; thought it was 530pm. PT advised of future appointments and [facility] no show policy. No questions at end of call

## 2023-06-28 ENCOUNTER — Ambulatory Visit

## 2023-06-28 DIAGNOSIS — M5459 Other low back pain: Secondary | ICD-10-CM

## 2023-06-28 DIAGNOSIS — M6208 Separation of muscle (nontraumatic), other site: Secondary | ICD-10-CM | POA: Diagnosis not present

## 2023-06-28 DIAGNOSIS — M6281 Muscle weakness (generalized): Secondary | ICD-10-CM | POA: Diagnosis not present

## 2023-06-28 NOTE — Therapy (Signed)
 OUTPATIENT PHYSICAL THERAPY THORACOLUMBAR TREATMENT   Patient Name: Alexandra Heath MRN: [id number] DOB:05/07/1980, 43 y.o., female Today's Date: 06/28/2023  END OF SESSION:      PT End of Session - 06/28/23 1645     Visit Number 8    Number of Visits 20    Date for PT Re-Evaluation 07/26/23    Authorization Type [place] [employer] M[employer]    Authorization - Number of Visits 20    Progress Note Due on Visit 10    PT Start Time 1645    PT Stop Time 1725    PT Time Calculation (min) 40 min    Activity Tolerance Patient tolerated treatment well    Behavior During Therapy [facility] for tasks assessed/performed             Past Medical History:  Diagnosis Date   Epilepsy (HCC)    Fibroids 05/14/2015   Rapid heart beat    Scoliosis    Scoliosis    Tachycardia    Past Surgical History:  Procedure Laterality Date   CHOLECYSTECTOMY     GALLBLADDER SURGERY     TUBAL LIGATION N/A 12/02/2020   Procedure: POST PARTUM TUBAL LIGATION;  Surgeon: Carolynn Citrin, MD;  Location: [facility] ORS;  Service: Gynecology;  Laterality: N/A;   Patient Active Problem List   Diagnosis Date Noted   NSVD (normal spontaneous vaginal delivery) 12/03/2020   S/P tubal ligation 12/03/2020   Elevated blood-pressure reading without diagnosis of hypertension 12/26/2019   Encounter for supervision of high risk pregnancy due to advanced maternal age in primigravida 07/03/2019   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 Shaunna [place]   REFERRING PROVIDER: Dr. Conrado Delay   REFERRING DIAG: M62.08 (ICD-10-CM) - Separation of muscle (nontraumatic), other site  Rationale for  Evaluation and Treatment: Rehabilitation  THERAPY DIAG:  Muscle weakness (generalized)  Other low back pain  Diastasis recti  ONSET DATE: 10+ years ago   SUBJECTIVE:                                                                                                                                                                                           SUBJECTIVE STATEMENT: Patient reports 0/10 pain; pt endorses loss of 6lbs in total weight.  No questions or concerns.   PERTINENT HISTORY:  Pt  started experiencing diastasis recti around her third child in 2010. Since then she has been managing it on her own. She also has a umbilical hernia that was confirmed by Dr. Conrado Delay on March 19th, 2025. He gave her permission to initiate PT before exploring further interventions. She has not experienced increased pain or discomfort because of the hernia or diastasis recti.   PAIN:  Are you having pain? No  PRECAUTIONS: Avoid positions that exacerbate dooming in area inside diastasis recti like crunches or leg lifts.   RED FLAGS: None   WEIGHT BEARING RESTRICTIONS: No  FALLS:  Has patient fallen in last 6 months? No  LIVING ENVIRONMENT: Lives with: lives with their family Lives in: Did not ask  Stairs: Did not ask  Has following equipment at home: None  OCCUPATION: Did not ask   PLOF: Independent  PATIENT GOALS: To reduce diastasis recti    NEXT MD VISIT: Not sure   OBJECTIVE:  Note: Objective measures were completed at Evaluation unless otherwise noted.  VITALS BP 118/74 HR 80 SpO2 100   DIAGNOSTIC FINDINGS:  None listed   PATIENT SURVEYS:  None performed   COGNITION: Overall cognitive status: Within functional limits for tasks assessed     SENSATION: WFL  MUSCLE LENGTH: Hamstrings: Right NT deg; Left NT deg Thomas test: Not tested         Ely's Test: Positive Bilaterally   POSTURE: No Significant postural limitations  PALPATION: No areas tender to palpate     LOWER EXTREMITY MMT:    MMT Right eval Left eval  Hip flexion 4+ 4+  Hip extension 4- 4-  Hip abduction 4 4  Hip adduction 4- 4-  Hip internal rotation    Hip external rotation    Knee flexion 4+ 4+  Knee extension 4+ 4+  Ankle dorsiflexion    Ankle plantarflexion    Ankle inversion    Ankle eversion     (Blank rows = not tested)   FUNCTIONAL TESTS:  Sahrmann Test: Level 4       TREATMENT DATE: 06/28/2023   Thereapeutic Exercise (Focused on core stabilization):  Pallof Press while seated on White Yoga Ball  2 x 15 reps with Green TB  Dead Bug against [employer] manager Position: 2 x 15s    Alternating LE: 2 x 6    Single Leg Bridge  2 x 10 Alternating LE   Single Leg Bridge with hip Abduction   R: 2 x 5 Red TB   L: 2 x 5 Red TB  There.act (focused on core stabilization, LE strengthening and functional strength for optimizing lifting mechanics and TrA activation with dynamic activities):   Farmer's Carry  1 x 36' with 30# KB in RUE  1 x 52' with 30# KB in RUE   Runners Pose onto 6" step (No UE support)   R SLS: 1 x 10   L SLS: 1 x 10   Partial Squats on Reverse Bosu for core stabilization, LE strength and squat mechanics   2 x 10, BUE Support from PT  [employer]   2 x 10 30# KB   Standing on Reverse Bosu with Ball Catch/Toss from PT  1 x 10 Forward Toss   1 x 5 R/L Rotational Toss    - Notable Hip Righting Reactions; able to maintain balance on reverse bosu while catching object from different directions    05/21/23: THEREX  TM Warm up 1.0 mph for 5  min  OMEGA Palloff Press #10 3 x 10  Bird Dog against counter 2 x 10  -min VC to reach forward instead of out to side  Seated Anterior Pelvic Tilt with 2 sec hold 2 x 10   Physical Performance  SF-36 Assessment:  -Physical Functioning: 20% -Role Limitations due to physical health: 50% -Role Limitations due to emotional health: 33% -Energy/fatigue: 10% -Emotional well being:  32% -Social Functioning: 13% -Pain: 58% -General Health: 40% -Health Change: 50%    05/16/23:                                                                                                                               THEREX   90/90 Supine Heel Touches 2 x 10  -dooming in diastasis recti with no symptoms Bird Dog 2 x 10    PATIENT EDUCATION:  Education details: Form and technique for appropriate exercise, HEP  Person educated: Patient Education method: Programmer, multimedia, Demonstration, Verbal cues, and Handouts Education comprehension: verbalized understanding, returned demonstration, and verbal cues required  HOME EXERCISE PROGRAM: Access Code: [email or web address] URL: [email or web address] Date: 06/28/2023 Prepared by: Satira Curet  Exercises - Supine Bridge  - 1 x daily - 3-4 x weekly - 2-3 sets - 10-12 reps - Alternating Single Leg Bridge  - 1 x daily - 3-4 x weekly - 2-3 sets - 10-12 reps - Mini Squat  - 1 x daily - 3-4 x weekly - 2-3 sets - 10-12 reps - Supine Dead Bug with Leg Extension  - 1 x daily - 3-4 x weekly - 2-3 sets - 10-12 reps - Single-Leg Anti-Rotation Press With Anchored Resistance  - 1 x daily - 3-4 x weekly - 2-3 sets - 10-12 reps  Access Code: [email or web address] URL: [email or web address] Date: 06/12/2023 Prepared by: Satira Curet  Exercises - Seated Posterior Pelvic Tilt  - 1 x daily - 7 x weekly - 2 sets - 10 reps - 2 sec  hold - Bird Dog on Counter  - 3-4 x weekly - 3 sets - 10 reps - Supine Bridge  - 1 x daily - 3-4 x weekly - 2-3 sets - 10 reps - Side Plank on Knees  - 1 x daily - 3-4 x weekly - 2-3 sets - 10 reps - Mini Squat  - 1 x daily - 3-4 x weekly - 2-3 sets - 10-12 reps - Supine Dead Bug with Leg Extension  - 1 x daily - 3-4 x weekly - 2-3 sets - 10-12 reps  Access Code: [email or web address] URL: [email or web address] Date: 05/29/2023 Prepared by: Satira Curet  Exercises - Squat with Chair Touch  - 3-4 x weekly - 3 sets  - 10 reps - Seated Posterior Pelvic Tilt  - 1 x daily - 7 x weekly - 2 sets - 10 reps - 2 sec  hold - Bird Dog on Counter  - 3-4 x weekly - 3 sets - 10 reps - Supine Bridge  -  1 x daily - 7 x weekly - 2-3 sets - 10 reps - Side Plank on Knees  - 1 x daily - 7 x weekly - 2-3 sets - 10 reps  Access Code: [email or web address] URL: [email or web address] Date: 05/21/2023 Prepared by: Marge Shed  Exercises - Squat with Chair Touch  - 3-4 x weekly - 3 sets - 10 reps - Seated Posterior Pelvic Tilt  - 1 x daily - 7 x weekly - 2 sets - 10 reps - 2 sec  hold - Bird Dog on Counter  - 3-4 x weekly - 3 sets - 10 reps  ASSESSMENT:  CLINICAL IMPRESSION: Contined PT POC with focus on abdominal stabilization and functional strength. Intermittent periods of notable doming with lifting kettlebell; no report of pain from the patient. Pt tolerated increase in intensity with stabilization exercises. Pt still required cues for proper lifting techniques; PT cued for wide BoS and TrA activation prior to lifting heavy object.  Pt still reports intermittent lumbar/abdominal pain and presents with deficits in abdominal strength. Based on her deficits, pt will continue to benefit from skilled PT in order to facilitate lower risk with worsen diastasis recti.   OBJECTIVE IMPAIRMENTS: decreased strength, impaired flexibility, and obesity.   ACTIVITY LIMITATIONS: carrying, lifting, bending, squatting, and caring for others  PARTICIPATION LIMITATIONS: cleaning, community activity, and yard work  PERSONAL FACTORS: 3+ comorbidities: Depression, anxiety and HTN are also affecting patient's functional outcome.   REHAB POTENTIAL: Good  CLINICAL DECISION MAKING: Stable/uncomplicated  EVALUATION COMPLEXITY: Low   GOALS: Goals reviewed with patient? No  SHORT TERM GOALS: Target date: 05/31/2023  Patient will demonstrate undestanding of home exercise plan by performing exercises correctly with evidence of good  carry over with min to no verbal or tactile cues .   Baseline: NT 05/21/23: Able to perform independently  Goal status: ONGOING   2.  Patient will be able to demonstrate understanding of transverse abdominis activation by performing posterior pelvic tilt in sitting with no cueing and without holding breath.   Baseline: Able to perform posterior pelvic tilt but requires holding breath Goal status: ONGOING     LONG TERM GOALS: Target date: 07/26/2023  Patient will demonstrate an improvement in SF-36 score by >=10% indicating a decrease in disability and improvement in overall function.  Baseline:  SF-36 -Physical Functioning: 20% -Role Limitations due to physical health: 50% -Role Limitations due to emotional health: 33% -Energy/fatigue: 10% -Emotional well being: 32% -Social Functioning: 13% -Pain: 58% -General Health: 40% -Health Change: 50% Goal status: ONGOING   2.  Patient will improve abdominal strength by 1 level on Sahrmann test as evidence of improved core stability and mitigation of diastasis recti.  Baseline: Level 4   Goal status: ONGOING   3.  Patient will demonstrate understanding of how to utilize core stability and proper hip hinge technique while lifting objects that weight similarly to her 35 and 3 year old children or >=40 lbs from floor into standing.  Baseline: #40 KB lift increased lordosis with dead lift  Goal status: ONGOING    4.  Patient will improve hip strength by >=1/3  grade (ie 4 to 4-) for improved lumbar and pelvic stability   Baseline: Hip Ext R/L 4-/4-, Hip Abd R/L 4/4, Hip Add R/L 4-/4-  Goal status: ONGOING    PLAN:  PT FREQUENCY: 1-2x/week  PT DURATION: 10 weeks  PLANNED INTERVENTIONS: 97164- PT Re-evaluation, 97110-Therapeutic exercises, 97530- Therapeutic activity, [id number]- Neuromuscular re-education, 97535- Self Care, 78295- Manual therapy, 62130-  Gait training, 47829- Orthotic Fit/training, 56213- Aquatic Therapy, Patient/Family  education, Balance training, Stair training, Dry Needling, Joint mobilization, Joint manipulation, Spinal manipulation, Spinal mobilization, Vestibular training, DME instructions, Cryotherapy, and Moist heat.  PLAN FOR NEXT SESSION:    Continue training hip hinge for improved biomechanics when lifting. Loading with proper hip hinge. TA activation with diaphragmatic breathing. Seated Yoga ball in order to activate TrA.    Satira Curet PT, DPT [facility] [address] [place], [address] Phone: [phone]   Fax:  [phone]

## 2023-07-02 ENCOUNTER — Encounter: Admitting: Physical Therapy

## 2023-07-03 ENCOUNTER — Ambulatory Visit

## 2023-07-03 DIAGNOSIS — M5459 Other low back pain: Secondary | ICD-10-CM

## 2023-07-03 DIAGNOSIS — M6281 Muscle weakness (generalized): Secondary | ICD-10-CM | POA: Diagnosis not present

## 2023-07-03 DIAGNOSIS — M6208 Separation of muscle (nontraumatic), other site: Secondary | ICD-10-CM

## 2023-07-03 NOTE — Therapy (Signed)
 OUTPATIENT PHYSICAL THERAPY THORACOLUMBAR TREATMENT   Patient Name: Alexandra Heath MRN: [id number] DOB:06/17/1980, 43 y.o., female Today's Date: 07/03/2023  END OF SESSION:      PT End of Session - 07/03/23 1646     Visit Number 9    Number of Visits 20    Date for PT Re-Evaluation 07/26/23    Authorization Type [place] [employer]    Authorization - Number of Visits 20    Progress Note Due on Visit 10    PT Start Time 1645    PT Stop Time 1725    PT Time Calculation (min) 40 min    Activity Tolerance Patient tolerated treatment well    Behavior During Therapy [facility] for tasks assessed/performed             Past Medical History:  Diagnosis Date   Epilepsy (HCC)    Fibroids 05/14/2015   Rapid heart beat    Scoliosis    Scoliosis    Tachycardia    Past Surgical History:  Procedure Laterality Date   CHOLECYSTECTOMY     GALLBLADDER SURGERY     TUBAL LIGATION N/A 12/02/2020   Procedure: POST PARTUM TUBAL LIGATION;  Surgeon: Carolynn Citrin, MD;  Location: [facility] ORS;  Service: Gynecology;  Laterality: N/A;   Patient Active Problem List   Diagnosis Date Noted   NSVD (normal spontaneous vaginal delivery) 12/03/2020   S/P tubal ligation 12/03/2020   Elevated blood-pressure reading without diagnosis of hypertension 12/26/2019   Encounter for supervision of high risk pregnancy due to advanced maternal age in primigravida 07/03/2019   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 Shaunna [place]   REFERRING PROVIDER: Dr. Conrado Delay   REFERRING DIAG: M62.08 (ICD-10-CM) - Separation of muscle (nontraumatic), other site  Rationale for  Evaluation and Treatment: Rehabilitation  THERAPY DIAG:  Muscle weakness (generalized)  Other low back pain  Diastasis recti  ONSET DATE: 10+ years ago   SUBJECTIVE:                                                                                                                                                                                           SUBJECTIVE STATEMENT: Patient reports fatigue due to daughter having sickness. 0/10 NPS. Patient reports 8/10 pain in abdominal pain performing household activities. No questions or  concerns.   PERTINENT HISTORY:  Pt started experiencing diastasis recti around her third child in 2010. Since then she has been managing it on her own. She also has a umbilical hernia that was confirmed by Dr. Conrado Delay on March 19th, 2025. He gave her permission to initiate PT before exploring further interventions. She has not experienced increased pain or discomfort because of the hernia or diastasis recti.   PAIN:  Are you having pain? No  PRECAUTIONS: Avoid positions that exacerbate dooming in area inside diastasis recti like crunches or leg lifts.   RED FLAGS: None   WEIGHT BEARING RESTRICTIONS: No  FALLS:  Has patient fallen in last 6 months? No  LIVING ENVIRONMENT: Lives with: lives with their family Lives in: Did not ask  Stairs: Did not ask  Has following equipment at home: None  OCCUPATION: Did not ask   PLOF: Independent  PATIENT GOALS: To reduce diastasis recti    NEXT MD VISIT: Not sure   OBJECTIVE:  Note: Objective measures were completed at Evaluation unless otherwise noted.  VITALS BP 118/74 HR 80 SpO2 100   DIAGNOSTIC FINDINGS:  None listed   PATIENT SURVEYS:  None performed   COGNITION: Overall cognitive status: Within functional limits for tasks assessed     SENSATION: WFL  MUSCLE LENGTH: Hamstrings: Right NT deg; Left NT deg Thomas test: Not tested         Ely's Test: Positive Bilaterally   POSTURE: No  Significant postural limitations  PALPATION: No areas tender to palpate    LOWER EXTREMITY MMT:    MMT Right eval Left eval  Hip flexion 4+ 4+  Hip extension 4- 4-  Hip abduction 4 4  Hip adduction 4- 4-  Hip internal rotation    Hip external rotation    Knee flexion 4+ 4+  Knee extension 4+ 4+  Ankle dorsiflexion    Ankle plantarflexion    Ankle inversion    Ankle eversion     (Blank rows = not tested)   FUNCTIONAL TESTS:  Sahrmann Test: Level 4       TREATMENT DATE: 07/03/2023   Thereapeutic Exercise (Focused on core stabilization):  Single Leg Bridge LLE Straight:   3 x 10   Dead Bug with anti rotation   2 x 10, green TB with alternating LE   Side Plank for core stabilization   1 x 10s   3 x 10s, elbow on balance blue balance pad   Seated Pallof Press for core stabilization  1 x 12 Black TB   2 x 12 Blue TB   There.act (focused on core stabilization, LE strengthening and functional strength for optimizing lifting mechanics and TrA activation with dynamic activities):   Static Partial Squat on Reverse Bosu with Pallof  2 x 10, Red TB   - PT added perturbations at Bosu on second set   Kettlebell Squat x5 --> Kettlebell Farmer's Carry (RUE) --> Kettlebell Squat x5 (to simulate patient carrying heavier objects throughout the house)   4 Laps, 20# KB   Slow Marches with weight in UE   1 x 10 alternating LE, 3Kg in BUE   2 x 10 alternating LE, 4# DB RUE   Reverse Lunge with Anti Rotation   R/L Reverse: 1 x 10 with Green TB     05/21/23: THEREX  TM Warm up 1.0 mph for 5 min  OMEGA Palloff Press #10 3 x 10  Bird Dog against counter 2 x 10  -min  VC to reach forward instead of out to side  Seated Anterior Pelvic Tilt with 2 sec hold 2 x 10   Physical Performance  SF-36 Assessment:  -Physical Functioning: 20% -Role Limitations due to physical health: 50% -Role Limitations due to emotional health: 33% -Energy/fatigue: 10% -Emotional well being:  32% -Social Functioning: 13% -Pain: 58% -General Health: 40% -Health Change: 50%    05/16/23:                                                                                                                               THEREX   90/90 Supine Heel Touches 2 x 10  -dooming in diastasis recti with no symptoms Bird Dog 2 x 10    PATIENT EDUCATION:  Education details: Form and technique for appropriate exercise, HEP  Person educated: Patient Education method: Programmer, multimedia, Demonstration, Verbal cues, and Handouts Education comprehension: verbalized understanding, returned demonstration, and verbal cues required  HOME EXERCISE PROGRAM: Access Code: [email or web address] URL: [email or web address] Date: 06/28/2023 Prepared by: Satira Curet  Exercises - Supine Bridge  - 1 x daily - 3-4 x weekly - 2-3 sets - 10-12 reps - Alternating Single Leg Bridge  - 1 x daily - 3-4 x weekly - 2-3 sets - 10-12 reps - Mini Squat  - 1 x daily - 3-4 x weekly - 2-3 sets - 10-12 reps - Supine Dead Bug with Leg Extension  - 1 x daily - 3-4 x weekly - 2-3 sets - 10-12 reps - Single-Leg Anti-Rotation Press With Anchored Resistance  - 1 x daily - 3-4 x weekly - 2-3 sets - 10-12 reps  Access Code: [email or web address] URL: [email or web address] Date: 06/12/2023 Prepared by: Satira Curet  Exercises - Seated Posterior Pelvic Tilt  - 1 x daily - 7 x weekly - 2 sets - 10 reps - 2 sec  hold - Bird Dog on Counter  - 3-4 x weekly - 3 sets - 10 reps - Supine Bridge  - 1 x daily - 3-4 x weekly - 2-3 sets - 10 reps - Side Plank on Knees  - 1 x daily - 3-4 x weekly - 2-3 sets - 10 reps - Mini Squat  - 1 x daily - 3-4 x weekly - 2-3 sets - 10-12 reps - Supine Dead Bug with Leg Extension  - 1 x daily - 3-4 x weekly - 2-3 sets - 10-12 reps  Access Code: [email or web address] URL: [email or web address] Date: 05/29/2023 Prepared by: Satira Curet  Exercises - Squat with Chair Touch  - 3-4 x weekly - 3 sets  - 10 reps - Seated Posterior Pelvic Tilt  - 1 x daily - 7 x weekly - 2 sets - 10 reps - 2 sec  hold - Bird Dog on Counter  - 3-4 x weekly - 3 sets - 10 reps - Supine Bridge  - 1 x daily - 7 x weekly - 2-3 sets - 10 reps - Side Plank on Knees  -  1 x daily - 7 x weekly - 2-3 sets - 10 reps  Access Code: [email or web address] URL: [email or web address] Date: 05/21/2023 Prepared by: Marge Shed  Exercises - Squat with Chair Touch  - 3-4 x weekly - 3 sets - 10 reps - Seated Posterior Pelvic Tilt  - 1 x daily - 7 x weekly - 2 sets - 10 reps - 2 sec  hold - Bird Dog on Counter  - 3-4 x weekly - 3 sets - 10 reps  ASSESSMENT:  CLINICAL IMPRESSION: Continued PT POC with focus on core stabilization and functional strengthening. Pt tolerated increase in intensity with core strengthening exercises. Good carryover from last session with deep squat mechanics (utilized wide BoS and proper hip hinge). Patient demonstrated good ability to balance on unlevel surfaces while engaging abdominal musculature. Notable doming intermittently throughout session however pt didn't report pain along abdominal musculature. PT plans to reassess patients progress towards PT goals and personal goals. She still presents with decreased functional strength and intermittent pain in her abdominal muscles. These deficits limit her from full participation in household and recreational activities. Based on today's performance pt will continue to benefit from skilled physical therapy in order to maximize return to PLOF and prevent further injury to diastasis recti.      OBJECTIVE IMPAIRMENTS: decreased strength, impaired flexibility, and obesity.   ACTIVITY LIMITATIONS: carrying, lifting, bending, squatting, and caring for others  PARTICIPATION LIMITATIONS: cleaning, community activity, and yard work  PERSONAL FACTORS: 3+ comorbidities: Depression, anxiety and HTN are also affecting patient's functional outcome.   REHAB  POTENTIAL: Good  CLINICAL DECISION MAKING: Stable/uncomplicated  EVALUATION COMPLEXITY: Low   GOALS: Goals reviewed with patient? No  SHORT TERM GOALS: Target date: 05/31/2023  Patient will demonstrate undestanding of home exercise plan by performing exercises correctly with evidence of good carry over with min to no verbal or tactile cues .   Baseline: NT 05/21/23: Able to perform independently  Goal status: ONGOING   2.  Patient will be able to demonstrate understanding of transverse abdominis activation by performing posterior pelvic tilt in sitting with no cueing and without holding breath.   Baseline: Able to perform posterior pelvic tilt but requires holding breath Goal status: ONGOING     LONG TERM GOALS: Target date: 07/26/2023  Patient will demonstrate an improvement in SF-36 score by >=10% indicating a decrease in disability and improvement in overall function.  Baseline:  SF-36 -Physical Functioning: 20% -Role Limitations due to physical health: 50% -Role Limitations due to emotional health: 33% -Energy/fatigue: 10% -Emotional well being: 32% -Social Functioning: 13% -Pain: 58% -General Health: 40% -Health Change: 50% Goal status: ONGOING   2.  Patient will improve abdominal strength by 1 level on Sahrmann test as evidence of improved core stability and mitigation of diastasis recti.  Baseline: Level 4   Goal status: ONGOING   3.  Patient will demonstrate understanding of how to utilize core stability and proper hip hinge technique while lifting objects that weight similarly to her 74 and 30 year old children or >=40 lbs from floor into standing.  Baseline: #40 KB lift increased lordosis with dead lift  Goal status: ONGOING    4.  Patient will improve hip strength by >=1/3  grade (ie 4 to 4-) for improved lumbar and pelvic stability   Baseline: Hip Ext R/L 4-/4-, Hip Abd R/L 4/4, Hip Add R/L 4-/4-  Goal status: ONGOING    PLAN:  PT FREQUENCY: 1-2x/week  PT  DURATION: 10 weeks  PLANNED INTERVENTIONS: 97164- PT Re-evaluation, 97110-Therapeutic exercises, 97530- Therapeutic activity, 97112- Neuromuscular re-education, [phone]- Self Care, 19147- Manual therapy, [phone]- Gait training, [phone]- Orthotic Fit/training, [phone]- Aquatic Therapy, Patient/Family education, Balance training, Stair training, Dry Needling, Joint mobilization, Joint manipulation, Spinal manipulation, Spinal mobilization, Vestibular training, DME instructions, Cryotherapy, and Moist heat.  PLAN FOR NEXT SESSION:  Progress Note. Continue training hip hinge for improved biomechanics when lifting. TA activation with diaphragmatic breathing. Seated Yoga ball in order to activate TrA.    Satira Curet PT, DPT [facility] [address] [place], [address] Phone: [phone]   Fax:  [phone]

## 2023-07-04 ENCOUNTER — Encounter: Admitting: Physical Therapy

## 2023-07-05 ENCOUNTER — Ambulatory Visit

## 2023-07-10 ENCOUNTER — Ambulatory Visit

## 2023-07-10 DIAGNOSIS — M6208 Separation of muscle (nontraumatic), other site: Secondary | ICD-10-CM | POA: Diagnosis not present

## 2023-07-10 DIAGNOSIS — M6281 Muscle weakness (generalized): Secondary | ICD-10-CM

## 2023-07-10 DIAGNOSIS — M5459 Other low back pain: Secondary | ICD-10-CM | POA: Diagnosis not present

## 2023-07-10 NOTE — Therapy (Signed)
 OUTPATIENT PHYSICAL THERAPY THORACOLUMBAR TREATMENT/PROGRESS NOTE   Dates of reporting period  05/16/2023   to   07/10/2023    Patient Name: Alexandra Heath MRN: [id number] DOB:January 02, 1981, 43 y.o., female Today's Date: 07/10/2023  END OF SESSION:      PT End of Session - 07/10/23 1646     Visit Number 10    Number of Visits 20    Date for PT Re-Evaluation 07/26/23    Authorization Type [place] [employer]    Authorization - Number of Visits 20    Progress Note Due on Visit 10    PT Start Time 1646    PT Stop Time 1728    PT Time Calculation (min) 42 min    Activity Tolerance Patient tolerated treatment well    Behavior During Therapy [facility] for tasks assessed/performed             Past Medical History:  Diagnosis Date   Epilepsy (HCC)    Fibroids 05/14/2015   Rapid heart beat    Scoliosis    Scoliosis    Tachycardia    Past Surgical History:  Procedure Laterality Date   CHOLECYSTECTOMY     GALLBLADDER SURGERY     TUBAL LIGATION N/A 12/02/2020   Procedure: POST PARTUM TUBAL LIGATION;  Surgeon: Carolynn Citrin, MD;  Location: [facility] ORS;  Service: Gynecology;  Laterality: N/A;   Patient Active Problem List   Diagnosis Date Noted   NSVD (normal spontaneous vaginal delivery) 12/03/2020   S/P tubal ligation 12/03/2020   Elevated blood-pressure reading without diagnosis of hypertension 12/26/2019   Encounter for supervision of high risk pregnancy due to advanced maternal age in primigravida 07/03/2019   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 Shaunna [place]   REFERRING PROVIDER: Dr. Conrado Delay   REFERRING DIAG: M62.08  (ICD-10-CM) - Separation of muscle (nontraumatic), other site  Rationale for Evaluation and Treatment: Rehabilitation  THERAPY DIAG:  Muscle weakness (generalized)  Other low back pain  Diastasis recti  ONSET DATE: 10+ years ago   SUBJECTIVE:                                                                                                                                                                                           SUBJECTIVE STATEMENT: Patient reports vertigo symptoms; aural fullness, minor  dizziness. Patient reports minor HA but willing to participate in PT. No questions or concerns.   PERTINENT HISTORY:  Pt started experiencing diastasis recti around her third child in 2010. Since then she has been managing it on her own. She also has a umbilical hernia that was confirmed by Dr. Conrado Delay on March 19th, 2025. He gave her permission to initiate PT before exploring further interventions. She has not experienced increased pain or discomfort because of the hernia or diastasis recti.   PAIN:  Are you having pain? No  PRECAUTIONS: Avoid positions that exacerbate dooming in area inside diastasis recti like crunches or leg lifts.   RED FLAGS: None   WEIGHT BEARING RESTRICTIONS: No  FALLS:  Has patient fallen in last 6 months? No  LIVING ENVIRONMENT: Lives with: lives with their family Lives in: Did not ask  Stairs: Did not ask  Has following equipment at home: None  OCCUPATION: Did not ask   PLOF: Independent  PATIENT GOALS: To reduce diastasis recti    NEXT MD VISIT: Not sure   OBJECTIVE:  Note: Objective measures were completed at Evaluation unless otherwise noted.  VITALS BP 118/74 HR 80 SpO2 100   DIAGNOSTIC FINDINGS:  None listed   PATIENT SURVEYS:  None performed   COGNITION: Overall cognitive status: Within functional limits for tasks assessed     SENSATION: WFL  MUSCLE LENGTH: Hamstrings: Right NT deg; Left NT deg Thomas test: Not  tested         Ely's Test: Positive Bilaterally   POSTURE: No Significant postural limitations  PALPATION: No areas tender to palpate    LOWER EXTREMITY MMT:    MMT Right eval Left eval  Hip flexion 4+ 4+  Hip extension 4- 4-  Hip abduction 4 4  Hip adduction 4- 4-  Hip internal rotation    Hip external rotation    Knee flexion 4+ 4+  Knee extension 4+ 4+  Ankle dorsiflexion    Ankle plantarflexion    Ankle inversion    Ankle eversion     (Blank rows = not tested)   FUNCTIONAL TESTS:  Sahrmann Test: Level 4     TREATMENT DATE: 07/10/2023   Thereapeutic Exercise (Focused on core stabilization):  NuStep L5-1 x 5 min x UE/LE (Seat 8) for LE warm up, endurance and strength; PT manually adjusted resistance throughout bout.    Extended Supine Bridge with toes raised and hip abduction  2 x 10, Blue TB around the knees   - Pt reports pain in the lower back   1 x 12, Normal Supine Bridge   Dead Bug for core stabilization  Static Hold  3 x 30s    Partial Squat with Kettlebell for LE strengthening   2 x 10, 20#  Pallof Press Radiation protection practitioner)   3 x 10, 10#   Suitcase Carry    2 x 24' with 40# KB   Standing Torso Rotation agaisnt resistance  2 x 10, Blue TB   Physical Performance:  SF-36  Physical functioning: 45 % Role limitations due to physical health: 25 % Role limitations due to emotional problems: 33.3 % Energy/fatigue: 25 % Emotional well-being: 28 % Social functioning: 37.5 % Pain: 45 % General health: 30 % Health change: 25 %  MMT: Hip Abd R/L 5/4+; Hip Add R/L 4+/4+; Hip Ext R/L 4+/4+   Kettle bell Lift   40# without VC from PT for neutral spine or pain in lower back  05/21/23: THEREX  TM Warm up 1.0 mph for 5 min  OMEGA Palloff Press #10 3 x 10  Bird Dog against counter 2 x 10  -min VC to reach forward instead of out to side  Seated Anterior Pelvic Tilt with 2 sec hold 2 x 10   Physical Performance  SF-36 Assessment:  -Physical  Functioning: 20% -Role Limitations due to physical health: 50% -Role Limitations due to emotional health: 33% -Energy/fatigue: 10% -Emotional well being: 32% -Social Functioning: 13% -Pain: 58% -General Health: 40% -Health Change: 50%    05/16/23:                                                                                                                               THEREX   90/90 Supine Heel Touches 2 x 10  -dooming in diastasis recti with no symptoms Bird Dog 2 x 10    PATIENT EDUCATION:  Education details: Form and technique for appropriate exercise, HEP  Person educated: Patient Education method: Programmer, multimedia, Demonstration, Verbal cues, and Handouts Education comprehension: verbalized understanding, returned demonstration, and verbal cues required  HOME EXERCISE PROGRAM: Access Code: [email or web address] URL: [email or web address] Date: 06/28/2023 Prepared by: Satira Curet  Exercises - Supine Bridge  - 1 x daily - 3-4 x weekly - 2-3 sets - 10-12 reps - Alternating Single Leg Bridge  - 1 x daily - 3-4 x weekly - 2-3 sets - 10-12 reps - Mini Squat  - 1 x daily - 3-4 x weekly - 2-3 sets - 10-12 reps - Supine Dead Bug with Leg Extension  - 1 x daily - 3-4 x weekly - 2-3 sets - 10-12 reps - Single-Leg Anti-Rotation Press With Anchored Resistance  - 1 x daily - 3-4 x weekly - 2-3 sets - 10-12 reps  Access Code: [email or web address] URL: [email or web address] Date: 06/12/2023 Prepared by: Satira Curet  Exercises - Seated Posterior Pelvic Tilt  - 1 x daily - 7 x weekly - 2 sets - 10 reps - 2 sec  hold - Bird Dog on Counter  - 3-4 x weekly - 3 sets - 10 reps - Supine Bridge  - 1 x daily - 3-4 x weekly - 2-3 sets - 10 reps - Side Plank on Knees  - 1 x daily - 3-4 x weekly - 2-3 sets - 10 reps - Mini Squat  - 1 x daily - 3-4 x weekly - 2-3 sets - 10-12 reps - Supine Dead Bug with Leg Extension  - 1 x daily - 3-4 x weekly - 2-3 sets - 10-12 reps  Access Code:  [email or web address] URL: [email or web address] Date: 05/29/2023 Prepared by: Satira Curet  Exercises - Squat with Chair Touch  - 3-4 x weekly - 3 sets - 10 reps - Seated Posterior Pelvic Tilt  - 1 x daily - 7 x weekly - 2 sets - 10 reps - 2 sec  hold - Bird Dog on Counter  - 3-4 x weekly -  3 sets - 10 reps - Supine Bridge  - 1 x daily - 7 x weekly - 2-3 sets - 10 reps - Side Plank on Knees  - 1 x daily - 7 x weekly - 2-3 sets - 10 reps  Access Code: [email or web address] URL: [email or web address] Date: 05/21/2023 Prepared by: Marge Shed  Exercises - Squat with Chair Touch  - 3-4 x weekly - 3 sets - 10 reps - Seated Posterior Pelvic Tilt  - 1 x daily - 7 x weekly - 2 sets - 10 reps - 2 sec  hold - Bird Dog on Counter  - 3-4 x weekly - 3 sets - 10 reps  ASSESSMENT:  CLINICAL IMPRESSION: Continued PT POC with focus on reassessment of progress towards PT goals and pt goals. Patient has demonstrated improvements in functional strength and core stabilization (See below at MMT and Saharmann Test below). She lifted a 40# kettlebell with proper form and no report of additional pain along lumbar spine or abdomen. Overall she has improved in several health domains as indicated in her self report of SF-36. Patient still presents with decreased activity tolerance and intermittent pain in her abdominal muscles. Additionally, she still has moderate fatigue and her pain limits her from full participation in recreational and household activities. PT plans to progress patient's functional strength and core stabilization as tolerated. Based on today's performance pt will continue to benefit from skilled physical therapy in order to maximize return to PLOF and prevent further injury to diastasis recti.     OBJECTIVE IMPAIRMENTS: decreased strength, impaired flexibility, and obesity.   ACTIVITY LIMITATIONS: carrying, lifting, bending, squatting, and caring for others  PARTICIPATION LIMITATIONS:  cleaning, community activity, and yard work  PERSONAL FACTORS: 3+ comorbidities: Depression, anxiety and HTN are also affecting patient's functional outcome.   REHAB POTENTIAL: Good  CLINICAL DECISION MAKING: Stable/uncomplicated  EVALUATION COMPLEXITY: Low   GOALS: Goals reviewed with patient? No  SHORT TERM GOALS: Target date: 05/31/2023  Patient will demonstrate undestanding of home exercise plan by performing exercises correctly with evidence of good carry over with min to no verbal or tactile cues .   Baseline: NT 05/21/23: Able to perform independently  Goal status: ONGOING   2.  Patient will be able to demonstrate understanding of transverse abdominis activation by performing posterior pelvic tilt in sitting with no cueing and without holding breath.   Baseline: Able to perform posterior pelvic tilt but requires holding breath Goal status: MET     LONG TERM GOALS: Target date: 07/26/2023  Patient will demonstrate an improvement in SF-36 score by >=10% indicating a decrease in disability and improvement in overall function.  Baseline:  SF-36 -Physical Functioning: 20% -Role Limitations due to physical health: 50% -Role Limitations due to emotional health: 33% -Energy/fatigue: 10% -Emotional well being: 32% -Social Functioning: 13% -Pain: 58% -General Health: 40% -Health Change: 50%  07/10/2023: Physical functioning: 45 % Role limitations due to physical health: 25 % Role limitations due to emotional problems: 33.3 % Energy/fatigue: 25 % Emotional well-being: 28 % Social functioning: 37.5 % Pain: 45 % General health: 30 % Health change: 25 %  Goal status: ONGOING   2.  Patient will improve abdominal strength by 1 level on Sahrmann test as evidence of improved core stability and mitigation of diastasis recti.  Baseline: Level 4; 05/27/205: Level 5 Goal status: ONGOING   3.  Patient will demonstrate understanding of how to utilize core stability and proper hip  hinge technique while lifting objects that  weight similarly to her 65 and 66 year old children or >=40 lbs from floor into standing.  Baseline: #40 KB lift increased lordosis with dead lift; 07/31/23: 40# KB with proper form Goal status: MET    4.  Patient will improve hip strength by >=1/3  grade (ie 4 to 4-) for improved lumbar and pelvic stability   Baseline: Hip Ext R/L 4-/4-, Hip Abd R/L 4/4, Hip Add R/L 4-/4-; 07-31-23: Hip Abd R/L 5/4+; Hip Add R/L 4+/4+; Hip Ext R/L 4+/4+  Goal status: MET     PLAN:  PT FREQUENCY: 1-2x/week  PT DURATION: 10 weeks  PLANNED INTERVENTIONS: 97164- PT Re-evaluation, 97110-Therapeutic exercises, 97530- Therapeutic activity, 97112- Neuromuscular re-education, 97535- Self Care, 16109- Manual therapy, [phone]- Gait training, [phone]- Orthotic Fit/training, [phone]- Aquatic Therapy, Patient/Family education, Balance training, Stair training, Dry Needling, Joint mobilization, Joint manipulation, Spinal manipulation, Spinal mobilization, Vestibular training, DME instructions, Cryotherapy, and Moist heat.  PLAN FOR NEXT SESSION:  Progress Note. Continue training hip hinge for improved biomechanics when lifting. TA activation with diaphragmatic breathing. Seated Yoga ball in order to activate TrA.    Satira Curet PT, DPT [facility] [address], [place], [address] Phone: [phone]   Fax:  [phone]

## 2023-07-11 ENCOUNTER — Encounter: Admitting: Physical Therapy

## 2023-07-12 ENCOUNTER — Ambulatory Visit

## 2023-07-12 DIAGNOSIS — M5459 Other low back pain: Secondary | ICD-10-CM | POA: Diagnosis not present

## 2023-07-12 DIAGNOSIS — M6208 Separation of muscle (nontraumatic), other site: Secondary | ICD-10-CM | POA: Diagnosis not present

## 2023-07-12 DIAGNOSIS — M6281 Muscle weakness (generalized): Secondary | ICD-10-CM | POA: Diagnosis not present

## 2023-07-12 NOTE — Therapy (Signed)
 OUTPATIENT PHYSICAL THERAPY THORACOLUMBAR TREATMENT/PROGRESS NOTE   Dates of reporting period  05/16/2023   to   07/10/2023    Patient Name: Alexandra Heath MRN: [id number] DOB:12-09-1980, 43 y.o., female Today's Date: 07/12/2023  END OF SESSION:      PT End of Session - 07/12/23 1650     Visit Number 11    Number of Visits 20    Date for PT Re-Evaluation 07/26/23    Authorization Type [place] [employer]    Authorization - Number of Visits 20    Progress Note Due on Visit 10    PT Start Time 1649    PT Stop Time 1730    PT Time Calculation (min) 41 min    Activity Tolerance Patient tolerated treatment well    Behavior During Therapy [facility] for tasks assessed/performed             Past Medical History:  Diagnosis Date   Epilepsy (HCC)    Fibroids 05/14/2015   Rapid heart beat    Scoliosis    Scoliosis    Tachycardia    Past Surgical History:  Procedure Laterality Date   CHOLECYSTECTOMY     GALLBLADDER SURGERY     TUBAL LIGATION N/A 12/02/2020   Procedure: POST PARTUM TUBAL LIGATION;  Surgeon: Carolynn Citrin, MD;  Location: [facility] ORS;  Service: Gynecology;  Laterality: N/A;   Patient Active Problem List   Diagnosis Date Noted   NSVD (normal spontaneous vaginal delivery) 12/03/2020   S/P tubal ligation 12/03/2020   Elevated blood-pressure reading without diagnosis of hypertension 12/26/2019   Encounter for supervision of high risk pregnancy due to advanced maternal age in primigravida 07/03/2019   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 Shaunna Delaware   REFERRING PROVIDER: Dr. Conrado Delay   REFERRING DIAG: M62.08  (ICD-10-CM) - Separation of muscle (nontraumatic), other site  Rationale for Evaluation and Treatment: Rehabilitation  THERAPY DIAG:  Muscle weakness (generalized)  Other low back pain  Diastasis recti  ONSET DATE: 10+ years ago   SUBJECTIVE:                                                                                                                                                                                           SUBJECTIVE STATEMENT: Patient reports 8/10 pain in the lower  back; improved fatigue and dizziness since last visit. Pt reports improved vestibular symptoms; no headaches.  No questions or concerns.   PERTINENT HISTORY:  Pt started experiencing diastasis recti around her third child in 2010. Since then she has been managing it on her own. She also has a umbilical hernia that was confirmed by Dr. Conrado Delay on March 19th, 2025. He gave her permission to initiate PT before exploring further interventions. She has not experienced increased pain or discomfort because of the hernia or diastasis recti.   PAIN:  Are you having pain? No  PRECAUTIONS: Avoid positions that exacerbate dooming in area inside diastasis recti like crunches or leg lifts.   RED FLAGS: None   WEIGHT BEARING RESTRICTIONS: No  FALLS:  Has patient fallen in last 6 months? No  LIVING ENVIRONMENT: Lives with: lives with their family Lives in: Did not ask  Stairs: Did not ask  Has following equipment at home: None  OCCUPATION: Did not ask   PLOF: Independent  PATIENT GOALS: To reduce diastasis recti    NEXT MD VISIT: Not sure   OBJECTIVE:  Note: Objective measures were completed at Evaluation unless otherwise noted.  VITALS BP 118/74 HR 80 SpO2 100   DIAGNOSTIC FINDINGS:  None listed   PATIENT SURVEYS:  None performed   COGNITION: Overall cognitive status: Within functional limits for tasks assessed     SENSATION: WFL  MUSCLE LENGTH: Hamstrings: Right NT deg; Left NT  deg Thomas test: Not tested         Ely's Test: Positive Bilaterally   POSTURE: No Significant postural limitations  PALPATION: No areas tender to palpate    LOWER EXTREMITY MMT:    MMT Right eval Left eval  Hip flexion 4+ 4+  Hip extension 4- 4-  Hip abduction 4 4  Hip adduction 4- 4-  Hip internal rotation    Hip external rotation    Knee flexion 4+ 4+  Knee extension 4+ 4+  Ankle dorsiflexion    Ankle plantarflexion    Ankle inversion    Ankle eversion     (Blank rows = not tested)   FUNCTIONAL TESTS:  Sahrmann Test: Level 4     TREATMENT DATE: 07/12/2023   Therapeutic Exercise (Focused on core stabilization):  Supine Bridge Progression   2 x 12   2 x 12 Blue TB around Knees  Supine Hip Flexion Marches against TB with UE at 90 shoulder flex.  2 x 10, Blue TB  1 x 10, PT resistance at knee   Flutter Kicks alternating LE  for core stabilization  1 x 10,1 x 18, 1 x 12   Therapeutic Activity:  [place] Dead Lift with Bernice Bring   3 x 10, 30# KB   Partial Squat with Kettlebell for LE strengthening   2 x 10, 20#  Standing Torso Rotation with SLS for core stabilization and standing at home  2 x 10, 2# med balls, SBA  TRX Reverse lunges in order to improve capacity to knee and stand without lower back pain  R/L foot back: 1 x 10   Patient Squatting and reacting to 3# Med ball rolling or tossing in her direction in order to improve random activity at home with toddlers  2 x 10   05/21/23: THEREX  TM Warm up 1.0 mph for 5 min  OMEGA Palloff Press #10 3 x 10  Bird Dog against counter 2 x 10  -min VC to reach forward instead of out to  side  Seated Anterior Pelvic Tilt with 2 sec hold 2 x 10   Physical Performance  SF-36 Assessment:  -Physical Functioning: 20% -Role Limitations due to physical health: 50% -Role Limitations due to emotional health: 33% -Energy/fatigue: 10% -Emotional well being: 32% -Social Functioning: 13% -Pain: 58% -General  Health: 40% -Health Change: 50%    05/16/23:                                                                                                                               THEREX   90/90 Supine Heel Touches 2 x 10  -dooming in diastasis recti with no symptoms Bird Dog 2 x 10    PATIENT EDUCATION:  Education details: Form and technique for appropriate exercise, HEP  Person educated: Patient Education method: Programmer, multimedia, Demonstration, Verbal cues, and Handouts Education comprehension: verbalized understanding, returned demonstration, and verbal cues required  HOME EXERCISE PROGRAM: Access Code: [email or web address] URL: [email or web address] Date: 06/28/2023 Prepared by: Satira Curet  Exercises - Supine Bridge  - 1 x daily - 3-4 x weekly - 2-3 sets - 10-12 reps - Alternating Single Leg Bridge  - 1 x daily - 3-4 x weekly - 2-3 sets - 10-12 reps - Mini Squat  - 1 x daily - 3-4 x weekly - 2-3 sets - 10-12 reps - Supine Dead Bug with Leg Extension  - 1 x daily - 3-4 x weekly - 2-3 sets - 10-12 reps - Single-Leg Anti-Rotation Press With Anchored Resistance  - 1 x daily - 3-4 x weekly - 2-3 sets - 10-12 reps  Access Code: [email or web address] URL: [email or web address] Date: 06/12/2023 Prepared by: Satira Curet  Exercises - Seated Posterior Pelvic Tilt  - 1 x daily - 7 x weekly - 2 sets - 10 reps - 2 sec  hold - Bird Dog on Counter  - 3-4 x weekly - 3 sets - 10 reps - Supine Bridge  - 1 x daily - 3-4 x weekly - 2-3 sets - 10 reps - Side Plank on Knees  - 1 x daily - 3-4 x weekly - 2-3 sets - 10 reps - Mini Squat  - 1 x daily - 3-4 x weekly - 2-3 sets - 10-12 reps - Supine Dead Bug with Leg Extension  - 1 x daily - 3-4 x weekly - 2-3 sets - 10-12 reps  Access Code: [email or web address] URL: [email or web address] Date: 05/29/2023 Prepared by: Satira Curet  Exercises - Squat with Chair Touch  - 3-4 x weekly - 3 sets - 10 reps - Seated Posterior Pelvic Tilt  - 1 x  daily - 7 x weekly - 2 sets - 10 reps - 2 sec  hold - Bird Dog on Counter  - 3-4 x weekly - 3 sets - 10 reps - Supine Bridge  - 1 x daily - 7 x weekly - 2-3 sets - 10 reps - Side Plank on Knees  - 1 x daily - 7 x weekly -  2-3 sets - 10 reps  Access Code: [email or web address] URL: [email or web address] Date: 05/21/2023 Prepared by: Marge Shed  Exercises - Squat with Chair Touch  - 3-4 x weekly - 3 sets - 10 reps - Seated Posterior Pelvic Tilt  - 1 x daily - 7 x weekly - 2 sets - 10 reps - 2 sec  hold - Bird Dog on Counter  - 3-4 x weekly - 3 sets - 10 reps  ASSESSMENT:  CLINICAL IMPRESSION: Continued PT POC with focus on improving functional strength and reducing lumbar pain. Pt tolerated all interventions without exacerbation of lower back pain. Demonstrated good ability to perform reactional squatting and lifting with proper form. Patient endorses pain in lower back has improved but still present and tolerable. Multimodal cues needed with TRX reverse lunges with good return demonstration from patient.  PT plans to progress patient's functional strength and core stabilization as tolerated. Based on today's performance pt will continue to benefit from skilled physical therapy in order to maximize return to PLOF and prevent further injury to diastasis recti.     OBJECTIVE IMPAIRMENTS: decreased strength, impaired flexibility, and obesity.   ACTIVITY LIMITATIONS: carrying, lifting, bending, squatting, and caring for others  PARTICIPATION LIMITATIONS: cleaning, community activity, and yard work  PERSONAL FACTORS: 3+ comorbidities: Depression, anxiety and HTN are also affecting patient's functional outcome.   REHAB POTENTIAL: Good  CLINICAL DECISION MAKING: Stable/uncomplicated  EVALUATION COMPLEXITY: Low   GOALS: Goals reviewed with patient? No  SHORT TERM GOALS: Target date: 05/31/2023  Patient will demonstrate undestanding of home exercise plan by performing exercises  correctly with evidence of good carry over with min to no verbal or tactile cues .   Baseline: NT 05/21/23: Able to perform independently  Goal status: ONGOING   2.  Patient will be able to demonstrate understanding of transverse abdominis activation by performing posterior pelvic tilt in sitting with no cueing and without holding breath.   Baseline: Able to perform posterior pelvic tilt but requires holding breath Goal status: MET     LONG TERM GOALS: Target date: 07/26/2023  Patient will demonstrate an improvement in SF-36 score by >=10% indicating a decrease in disability and improvement in overall function.  Baseline:  SF-36 -Physical Functioning: 20% -Role Limitations due to physical health: 50% -Role Limitations due to emotional health: 33% -Energy/fatigue: 10% -Emotional well being: 32% -Social Functioning: 13% -Pain: 58% -General Health: 40% -Health Change: 50%  02-Aug-2023: Physical functioning: 45 % Role limitations due to physical health: 25 % Role limitations due to emotional problems: 33.3 % Energy/fatigue: 25 % Emotional well-being: 28 % Social functioning: 37.5 % Pain: 45 % General health: 30 % Health change: 25 %  Goal status: ONGOING   2.  Patient will improve abdominal strength by 1 level on Sahrmann test as evidence of improved core stability and mitigation of diastasis recti.  Baseline: Level 4; 05/27/205: Level 5 Goal status: ONGOING   3.  Patient will demonstrate understanding of how to utilize core stability and proper hip hinge technique while lifting objects that weight similarly to her 57 and 8 year old children or >=40 lbs from floor into standing.  Baseline: #40 KB lift increased lordosis with dead lift; 08/02/2023: 40# KB with proper form Goal status: MET    4.  Patient will improve hip strength by >=1/3  grade (ie 4 to 4-) for improved lumbar and pelvic stability   Baseline: Hip Ext R/L 4-/4-, Hip Abd R/L 4/4, Hip Add R/L 4-/4-; August 02, 2023: Hip  Abd R/L 5/4+; Hip Add R/L 4+/4+; Hip Ext R/L 4+/4+  Goal status: MET     PLAN:  PT FREQUENCY: 1-2x/week  PT DURATION: 10 weeks  PLANNED INTERVENTIONS: 97164- PT Re-evaluation, 97110-Therapeutic exercises, 97530- Therapeutic activity, 97112- Neuromuscular re-education, 97535- Self Care, 57846- Manual therapy, [phone]- Gait training, [phone]- Orthotic Fit/training, [phone]- Aquatic Therapy, Patient/Family education, Balance training, Stair training, Dry Needling, Joint mobilization, Joint manipulation, Spinal manipulation, Spinal mobilization, Vestibular training, DME instructions, Cryotherapy, and Moist heat.  PLAN FOR NEXT SESSION:  Progress Note. Continue training hip hinge for improved biomechanics when lifting. TA activation with diaphragmatic breathing. Seated Yoga ball in order to activate TrA.    Satira Curet PT, DPT [facility] [address] [place], [address] Phone: [phone]   Fax:  [phone]

## 2023-07-16 ENCOUNTER — Encounter: Admitting: Physical Therapy

## 2023-07-17 ENCOUNTER — Encounter: Admitting: Physical Therapy

## 2023-07-19 ENCOUNTER — Encounter: Admitting: Physical Therapy

## 2023-07-24 ENCOUNTER — Encounter: Admitting: Physical Therapy

## 2023-07-26 ENCOUNTER — Encounter: Admitting: Physical Therapy

## 2023-07-31 ENCOUNTER — Encounter: Admitting: Physical Therapy

## 2023-07-31 DIAGNOSIS — R7309 Other abnormal glucose: Secondary | ICD-10-CM | POA: Diagnosis not present

## 2023-07-31 DIAGNOSIS — R7989 Other specified abnormal findings of blood chemistry: Secondary | ICD-10-CM | POA: Diagnosis not present

## 2023-07-31 DIAGNOSIS — Z79899 Other long term (current) drug therapy: Secondary | ICD-10-CM | POA: Diagnosis not present

## 2023-08-01 ENCOUNTER — Ambulatory Visit: Attending: Surgery

## 2023-08-01 DIAGNOSIS — M6208 Separation of muscle (nontraumatic), other site: Secondary | ICD-10-CM | POA: Diagnosis not present

## 2023-08-01 DIAGNOSIS — M5459 Other low back pain: Secondary | ICD-10-CM | POA: Insufficient documentation

## 2023-08-01 DIAGNOSIS — M6281 Muscle weakness (generalized): Secondary | ICD-10-CM | POA: Diagnosis not present

## 2023-08-01 DIAGNOSIS — M546 Pain in thoracic spine: Secondary | ICD-10-CM | POA: Insufficient documentation

## 2023-08-01 NOTE — Therapy (Signed)
 OUTPATIENT PHYSICAL THERAPY THORACOLUMBAR TREATMENT/RECERTIFICATION  Patient Name: Alexandra Heath MRN: [id number] DOB:August 02, 1980, 43 y.o., female Today's Date: 08/01/2023  END OF SESSION:      PT End of Session - 08/01/23 1818     Visit Number 12    Number of Visits 28    Date for PT Re-Evaluation 09/26/23    Authorization Type [place] [employer] [id number] for 12 PT vsts    Authorization Time Period 6/16-8/16    Authorization - Visit Number 1    Authorization - Number of Visits 12    Progress Note Due on Visit 20    PT Start Time 1817    PT Stop Time 1900    PT Time Calculation (min) 43 min    Activity Tolerance Patient tolerated treatment well    Behavior During Therapy [facility] for tasks assessed/performed          Past Medical History:  Diagnosis Date   Epilepsy (HCC)    Fibroids 05/14/2015   Rapid heart beat    Scoliosis    Scoliosis    Tachycardia    Past Surgical History:  Procedure Laterality Date   CHOLECYSTECTOMY     GALLBLADDER SURGERY     TUBAL LIGATION N/A 12/02/2020   Procedure: POST PARTUM TUBAL LIGATION;  Surgeon: Carolynn Citrin, MD;  Location: [facility] ORS;  Service: Gynecology;  Laterality: N/A;   Patient Active Problem List   Diagnosis Date Noted   NSVD (normal spontaneous vaginal delivery) 12/03/2020   S/P tubal ligation 12/03/2020   Elevated blood-pressure reading without diagnosis of hypertension 12/26/2019   Encounter for supervision of high risk pregnancy due to advanced maternal age in primigravida 07/03/2019   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 Shaunna Delaware   REFERRING PROVIDER:  Dr. Conrado Delay   REFERRING DIAG: M62.08 (ICD-10-CM) - Separation of muscle (nontraumatic), other site  Rationale for Evaluation and Treatment: Rehabilitation  THERAPY DIAG:  No diagnosis found.  ONSET DATE: 10+ years ago   SUBJECTIVE:                                                                                                                                                                                           SUBJECTIVE STATEMENT: Patient reports 8/10 in the lower back today; missed PT appt last week due  to stomach bug. No HEP performed in the last week. No further or concern.  PERTINENT HISTORY:  Pt started experiencing diastasis recti around her third child in 2010. Since then she has been managing it on her own. She also has a umbilical hernia that was confirmed by Dr. Conrado Delay on March 19th, 2025. He gave her permission to initiate PT before exploring further interventions. She has not experienced increased pain or discomfort because of the hernia or diastasis recti.   PAIN:  Are you having pain? No  PRECAUTIONS: Avoid positions that exacerbate dooming in area inside diastasis recti like crunches or leg lifts.   RED FLAGS: None   WEIGHT BEARING RESTRICTIONS: No  FALLS:  Has patient fallen in last 6 months? No  LIVING ENVIRONMENT: Lives with: lives with their family Lives in: Did not ask  Stairs: Did not ask  Has following equipment at home: None  OCCUPATION: Did not ask   PLOF: Independent  PATIENT GOALS: To reduce diastasis recti    NEXT MD VISIT: Not sure   OBJECTIVE:  Note: Objective measures were completed at Evaluation unless otherwise noted.  VITALS BP 118/74 HR 80 SpO2 100   DIAGNOSTIC FINDINGS:  None listed   PATIENT SURVEYS:  None performed   COGNITION: Overall cognitive status: Within functional limits for tasks assessed     SENSATION: WFL  MUSCLE LENGTH: Hamstrings: Right NT deg; Left NT deg Thomas test: Not tested          Ely's Test: Positive Bilaterally   POSTURE: No Significant postural limitations  PALPATION: No areas tender to palpate    LOWER EXTREMITY MMT:    MMT Right eval Left eval  Hip flexion 4+ 4+  Hip extension 4- 4-  Hip abduction 4 4  Hip adduction 4- 4-  Hip internal rotation    Hip external rotation    Knee flexion 4+ 4+  Knee extension 4+ 4+  Ankle dorsiflexion    Ankle plantarflexion    Ankle inversion    Ankle eversion     (Blank rows = not tested)   FUNCTIONAL TESTS:  Sahrmann Test: Level 4     TREATMENT DATE: 08/01/2023   Therapeutic Exercise (Focused on core stabilization):  Supine Bridge Progression   3 x 10 Blue TB around knees  Supine LE 90-90 with med ball overhead (GHJ flexion)   2 x 10, 2Kg med ball   Dead bug  Alternating LE/UE    2 x 10  Standing Pallof Press   1 x 10 5# with Resistance from R/L  1 x 10 10# with Resistance from R/L   Therapeutic Activity:  Kettle Bell Squat   2 x 10 20#   - Good carryover since last appt, no cues from PT  Seated Horizontal Chops with medicine ball   1 x 10, 2Kg   2 x 10, 3Kg   Seated Catch or reach in random directions with med ball in order to improve random activity at home with toddlers   2 x 10 3 Kg med ball  Pallof Walkout   2 x 10 Resistance from R/L ea  Sled Push/Pull  2 x 41m 70#   2 x 47m 120#     05/21/23: THEREX  TM Warm up 1.0 mph for 5 min  OMEGA Palloff Press #10 3 x 10  Bird Dog against counter 2 x 10  -min VC to reach forward instead of out to side  Seated Anterior Pelvic Tilt with 2  sec hold 2 x 10   Physical Performance  SF-36 Assessment:  -Physical Functioning: 20% -Role Limitations due to physical health: 50% -Role Limitations due to emotional health: 33% -Energy/fatigue: 10% -Emotional well being: 32% -Social Functioning: 13% -Pain: 58% -General Health: 40% -Health Change: 50%    05/16/23:                                                                                                                                THEREX   90/90 Supine Heel Touches 2 x 10  -dooming in diastasis recti with no symptoms Bird Dog 2 x 10    PATIENT EDUCATION:  Education details: Form and technique for appropriate exercise, HEP  Person educated: Patient Education method: Programmer, multimedia, Demonstration, Verbal cues, and Handouts Education comprehension: verbalized understanding, returned demonstration, and verbal cues required  HOME EXERCISE PROGRAM: Access Code: [email or web address] URL: [email or web address] Date: 06/28/2023 Prepared by: Satira Curet  Exercises - Supine Bridge  - 1 x daily - 3-4 x weekly - 2-3 sets - 10-12 reps - Alternating Single Leg Bridge  - 1 x daily - 3-4 x weekly - 2-3 sets - 10-12 reps - Mini Squat  - 1 x daily - 3-4 x weekly - 2-3 sets - 10-12 reps - Supine Dead Bug with Leg Extension  - 1 x daily - 3-4 x weekly - 2-3 sets - 10-12 reps - Single-Leg Anti-Rotation Press With Anchored Resistance  - 1 x daily - 3-4 x weekly - 2-3 sets - 10-12 reps  Access Code: [email or web address] URL: [email or web address] Date: 06/12/2023 Prepared by: Satira Curet  Exercises - Seated Posterior Pelvic Tilt  - 1 x daily - 7 x weekly - 2 sets - 10 reps - 2 sec  hold - Bird Dog on Counter  - 3-4 x weekly - 3 sets - 10 reps - Supine Bridge  - 1 x daily - 3-4 x weekly - 2-3 sets - 10 reps - Side Plank on Knees  - 1 x daily - 3-4 x weekly - 2-3 sets - 10 reps - Mini Squat  - 1 x daily - 3-4 x weekly - 2-3 sets - 10-12 reps - Supine Dead Bug with Leg Extension  - 1 x daily - 3-4 x weekly - 2-3 sets - 10-12 reps  Access Code: [email or web address] URL: [email or web address] Date: 05/29/2023 Prepared by: Satira Curet  Exercises - Squat with Chair Touch  - 3-4 x weekly - 3 sets - 10 reps - Seated Posterior Pelvic Tilt  - 1 x daily - 7 x weekly - 2 sets - 10 reps - 2 sec  hold - Bird Dog on Counter  - 3-4 x weekly - 3 sets - 10 reps - Supine  Bridge  - 1 x daily - 7 x weekly - 2-3 sets - 10 reps - Side Plank on Knees  - 1 x daily - 7 x weekly - 2-3 sets - 10 reps  Access  Code: [email or web address] URL: [email or web address] Date: 05/21/2023 Prepared by: Marge Shed  Exercises - Squat with Chair Touch  - 3-4 x weekly - 3 sets - 10 reps - Seated Posterior Pelvic Tilt  - 1 x daily - 7 x weekly - 2 sets - 10 reps - 2 sec  hold - Bird Dog on Counter  - 3-4 x weekly - 3 sets - 10 reps  ASSESSMENT:  CLINICAL IMPRESSION: Continued PT POC with focus on improving functional strength and reducing lumbar pain. Patient returning to PT after three week hiatus due to scheduling conflicts. Pt tolerated all interventions without exacerbation of lower back pain or abdominal pain. In recent progress not paient demonstrated improvements in functional strength, lifting form and core strength. She still experiences intermittent bouts of lower back pain and abdominal pain but reports that she lives with the pain. She still presents with doming of the abdominal muscles but no pain with palpation during exercises. She demonstrated good ability to perform hip hinge mechanics with lifting heavier objects. PT continues to progress functional strengthening exercises and core stability exercises within patient tolerance. Patient's condition has the potential to improve in response to therapy. Maximum improvement is yet to be obtained. The anticipated improvement is attainable and reasonable in a generally predictable time.  Based on today's performance pt will continue to benefit from skilled physical therapy 2x/week for 6 weeks in order to maximize return to PLOF and prevent further injury to diastasis recti.    OBJECTIVE IMPAIRMENTS: decreased strength, impaired flexibility, and obesity.   ACTIVITY LIMITATIONS: carrying, lifting, bending, squatting, and caring for others  PARTICIPATION LIMITATIONS: cleaning, community activity, and yard work  PERSONAL  FACTORS: 3+ comorbidities: Depression, anxiety and HTN are also affecting patient's functional outcome.   REHAB POTENTIAL: Good  CLINICAL DECISION MAKING: Stable/uncomplicated  EVALUATION COMPLEXITY: Low   GOALS: Goals reviewed with patient? No  SHORT TERM GOALS: Target date: 05/31/2023  Patient will demonstrate undestanding of home exercise plan by performing exercises correctly with evidence of good carry over with min to no verbal or tactile cues .   Baseline: NT 05/21/23: Able to perform independently  Goal status: ONGOING   2.  Patient will be able to demonstrate understanding of transverse abdominis activation by performing posterior pelvic tilt in sitting with no cueing and without holding breath.   Baseline: Able to perform posterior pelvic tilt but requires holding breath Goal status: MET     LONG TERM GOALS: Target date: 09/26/2023  Patient will demonstrate an improvement in SF-36 score by >=10% indicating a decrease in disability and improvement in overall function.  Baseline:  SF-36 -Physical Functioning: 20% -Role Limitations due to physical health: 50% -Role Limitations due to emotional health: 33% -Energy/fatigue: 10% -Emotional well being: 32% -Social Functioning: 13% -Pain: 58% -General Health: 40% -Health Change: 50%  07/10/2023: Physical functioning: 45 % Role limitations due to physical health: 25 % Role limitations due to emotional problems: 33.3 % Energy/fatigue: 25 % Emotional well-being: 28 % Social functioning: 37.5 % Pain: 45 % General health: 30 % Health change: 25 %  Goal status: ONGOING   2.  Patient will improve abdominal strength by 1 level on Sahrmann test as evidence of improved core stability and mitigation of diastasis recti.  Baseline: Level 4; 05/27/205: Level 5 Goal status: ONGOING   3.  Patient will demonstrate understanding of how to utilize core stability and proper hip hinge technique while lifting objects that weight  similarly to her 2 and 3 year  old children or >=40 lbs from floor into standing.  Baseline: #40 KB lift increased lordosis with dead lift; 07/21/2023: 40# KB with proper form Goal status: MET    4.  Patient will improve hip strength by >=1/3  grade (ie 4 to 4-) for improved lumbar and pelvic stability   Baseline: Hip Ext R/L 4-/4-, Hip Abd R/L 4/4, Hip Add R/L 4-/4-; 2023/07/21: Hip Abd R/L 5/4+; Hip Add R/L 4+/4+; Hip Ext R/L 4+/4+  Goal status: MET     PLAN:  PT FREQUENCY: 1-2x/week  PT DURATION: 6 weeks  PLANNED INTERVENTIONS: 97164- PT Re-evaluation, 97110-Therapeutic exercises, 97530- Therapeutic activity, 97112- Neuromuscular re-education, 97535- Self Care, 16109- Manual therapy, [phone]- Gait training, [phone]- Orthotic Fit/training, [phone]- Aquatic Therapy, Patient/Family education, Balance training, Stair training, Dry Needling, Joint mobilization, Joint manipulation, Spinal manipulation, Spinal mobilization, Vestibular training, DME instructions, Cryotherapy, and Moist heat.  PLAN FOR NEXT SESSION:  Progress functional strength and core stability training.  Continue training hip hinge for improved biomechanics when lifting. TA activation with diaphragmatic breathing. Seated Yoga ball in order to activate TrA.    Satira Curet PT, DPT [facility] [address] [place], [address] Phone: [phone]   Fax:  [phone]

## 2023-08-06 ENCOUNTER — Encounter: Admitting: Physical Therapy

## 2023-08-08 ENCOUNTER — Ambulatory Visit

## 2023-08-08 ENCOUNTER — Encounter: Admitting: Physical Therapy

## 2023-08-08 DIAGNOSIS — Z0000 Encounter for general adult medical examination without abnormal findings: Secondary | ICD-10-CM | POA: Diagnosis not present

## 2023-08-08 DIAGNOSIS — G43E09 Chronic migraine with aura, not intractable, without status migrainosus: Secondary | ICD-10-CM | POA: Diagnosis not present

## 2023-08-08 DIAGNOSIS — M5459 Other low back pain: Secondary | ICD-10-CM

## 2023-08-08 DIAGNOSIS — Z79899 Other long term (current) drug therapy: Secondary | ICD-10-CM | POA: Diagnosis not present

## 2023-08-08 DIAGNOSIS — M6208 Separation of muscle (nontraumatic), other site: Secondary | ICD-10-CM | POA: Diagnosis not present

## 2023-08-08 DIAGNOSIS — M6281 Muscle weakness (generalized): Secondary | ICD-10-CM | POA: Diagnosis not present

## 2023-08-08 DIAGNOSIS — R002 Palpitations: Secondary | ICD-10-CM | POA: Diagnosis not present

## 2023-08-08 DIAGNOSIS — R7309 Other abnormal glucose: Secondary | ICD-10-CM | POA: Diagnosis not present

## 2023-08-08 DIAGNOSIS — G40309 Generalized idiopathic epilepsy and epileptic syndromes, not intractable, without status epilepticus: Secondary | ICD-10-CM | POA: Diagnosis not present

## 2023-08-08 DIAGNOSIS — Z1331 Encounter for screening for depression: Secondary | ICD-10-CM | POA: Diagnosis not present

## 2023-08-08 DIAGNOSIS — M546 Pain in thoracic spine: Secondary | ICD-10-CM | POA: Diagnosis not present

## 2023-08-08 DIAGNOSIS — Z1231 Encounter for screening mammogram for malignant neoplasm of breast: Secondary | ICD-10-CM | POA: Diagnosis not present

## 2023-08-08 DIAGNOSIS — Z01419 Encounter for gynecological examination (general) (routine) without abnormal findings: Secondary | ICD-10-CM | POA: Diagnosis not present

## 2023-08-08 DIAGNOSIS — E782 Mixed hyperlipidemia: Secondary | ICD-10-CM | POA: Diagnosis not present

## 2023-08-08 NOTE — Therapy (Signed)
 OUTPATIENT PHYSICAL THERAPY THORACOLUMBAR TREATMENT  Patient Name: Alexandra Heath MRN: [id number] DOB:Jun 03, 1980, 43 y.o., female Today's Date: 08/08/2023  END OF SESSION:      PT End of Session - 08/08/23 0806     Visit Number 13    Number of Visits 28    Date for PT Re-Evaluation 09/26/23    Authorization Type [place] [employer] [id number] for 12 PT vsts    Authorization Time Period 6/16-8/16    Authorization - Visit Number 2    Authorization - Number of Visits 12    Progress Note Due on Visit 20    PT Start Time 0805    PT Stop Time 0845    PT Time Calculation (min) 40 min    Activity Tolerance Patient tolerated treatment well    Behavior During Therapy [facility] for tasks assessed/performed          Past Medical History:  Diagnosis Date   Epilepsy (HCC)    Fibroids 05/14/2015   Rapid heart beat    Scoliosis    Scoliosis    Tachycardia    Past Surgical History:  Procedure Laterality Date   CHOLECYSTECTOMY     GALLBLADDER SURGERY     TUBAL LIGATION N/A 12/02/2020   Procedure: POST PARTUM TUBAL LIGATION;  Surgeon: Lovetta Debby PARAS, MD;  Location: [facility] ORS;  Service: Gynecology;  Laterality: N/A;   Patient Active Problem List   Diagnosis Date Noted   NSVD (normal spontaneous vaginal delivery) 12/03/2020   S/P tubal ligation 12/03/2020   Elevated blood-pressure reading without diagnosis of hypertension 12/26/2019   Encounter for supervision of high risk pregnancy due to advanced maternal age in primigravida 07/03/2019   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 Juliane Costa   REFERRING PROVIDER: Dr. Henriette Pierre    REFERRING DIAG: M62.08 (ICD-10-CM) - Separation of muscle (nontraumatic), other site  Rationale for Evaluation and Treatment: Rehabilitation  THERAPY DIAG:  Diastasis recti  Muscle weakness (generalized)  Other low back pain  ONSET DATE: 10+ years ago   SUBJECTIVE:                                                                                                                                                                                           SUBJECTIVE STATEMENT: Patient reports current pain rating 3/10  NPS in the lower back prior to start of session. Patient with reduced rest last night ( < 5 hours) due to children staying up night. No further questions or concerns.   PERTINENT HISTORY:  Pt started experiencing diastasis recti around her third child in 2010. Since then she has been managing it on her own. She also has a umbilical hernia that was confirmed by Dr. Henriette Pierre on March 19th, 2025. He gave her permission to initiate PT before exploring further interventions. She has not experienced increased pain or discomfort because of the hernia or diastasis recti.   PAIN:  Are you having pain? No  PRECAUTIONS: Avoid positions that exacerbate dooming in area inside diastasis recti like crunches or leg lifts.   RED FLAGS: None   WEIGHT BEARING RESTRICTIONS: No  FALLS:  Has patient fallen in last 6 months? No  LIVING ENVIRONMENT: Lives with: lives with their family Lives in: Did not ask  Stairs: Did not ask  Has following equipment at home: None  OCCUPATION: Did not ask   PLOF: Independent  PATIENT GOALS: To reduce diastasis recti    NEXT MD VISIT: Not sure   OBJECTIVE:  Note: Objective measures were completed at Evaluation unless otherwise noted.  VITALS BP 118/74 HR 80 SpO2 100   DIAGNOSTIC FINDINGS:  None listed   PATIENT SURVEYS:  None performed   COGNITION: Overall cognitive status: Within functional limits for tasks  assessed     SENSATION: WFL  MUSCLE LENGTH: Hamstrings: Right NT deg; Left NT deg Thomas test: Not tested         Ely's Test: Positive Bilaterally   POSTURE: No Significant postural limitations  PALPATION: No areas tender to palpate    LOWER EXTREMITY MMT:    MMT Right eval Left eval  Hip flexion 4+ 4+  Hip extension 4- 4-  Hip abduction 4 4  Hip adduction 4- 4-  Hip internal rotation    Hip external rotation    Knee flexion 4+ 4+  Knee extension 4+ 4+  Ankle dorsiflexion    Ankle plantarflexion    Ankle inversion    Ankle eversion     (Blank rows = not tested)   FUNCTIONAL TESTS:  Sahrmann Test: Level 4     TREATMENT DATE: 08/08/2023   Therapeutic Exercise (Focused on core stabilization):  Supine Bridge with Legs extended on Heels  3 x 10, Black TB   Supine LE 90-90 with DB Overhead (GHJ flexion)   3 x 10, 5# DB   Supine LE 90-90, legs extended against resistance   2 x 10, alternating LE, red TB   1 x 12, alternating LE, green TB   Therapeutic Activity:  Wall Sit with Med Ball Rotation  3 x 10 alternating rotation  Kettle Bell Squat   3 x 8, 20# KB   Kettle Bell Dead lift  3 x 8, 20# KB  Standing Alternating Marches with KB   1 x 16 20# KB   1 x 18 30# KB   1 x 16 30# KB  - maintained balance throughout ea bout, no report of LBP.   Pallof Lateral Walkout against resistance   1 x 10 reps x 2 steps laterally, Green TB from R/L   1 x 10 reps x 2 steps laterally, Blue TB from R/L      05/21/23: THEREX  TM Warm up 1.0 mph for 5 min  OMEGA Palloff Press #10 3 x 10  Bird Dog against counter  2 x 10  -min VC to reach forward instead of out to side  Seated Anterior Pelvic Tilt with 2 sec hold 2 x 10   Physical Performance  SF-36 Assessment:  -Physical Functioning: 20% -Role Limitations due to physical health: 50% -Role Limitations due to emotional health: 33% -Energy/fatigue: 10% -Emotional well being: 32% -Social Functioning:  13% -Pain: 58% -General Health: 40% -Health Change: 50%    05/16/23:                                                                                                                               THEREX   90/90 Supine Heel Touches 2 x 10  -dooming in diastasis recti with no symptoms Bird Dog 2 x 10    PATIENT EDUCATION:  Education details: Form and technique for appropriate exercise, HEP  Person educated: Patient Education method: Programmer, multimedia, Demonstration, Verbal cues, and Handouts Education comprehension: verbalized understanding, returned demonstration, and verbal cues required  HOME EXERCISE PROGRAM: Access Code: [email or web address] URL: [email or web address] Date: 06/28/2023 Prepared by: Lonni Pall  Exercises - Supine Bridge  - 1 x daily - 3-4 x weekly - 2-3 sets - 10-12 reps - Alternating Single Leg Bridge  - 1 x daily - 3-4 x weekly - 2-3 sets - 10-12 reps - Mini Squat  - 1 x daily - 3-4 x weekly - 2-3 sets - 10-12 reps - Supine Dead Bug with Leg Extension  - 1 x daily - 3-4 x weekly - 2-3 sets - 10-12 reps - Single-Leg Anti-Rotation Press With Anchored Resistance  - 1 x daily - 3-4 x weekly - 2-3 sets - 10-12 reps  Access Code: [email or web address] URL: [email or web address] Date: 06/12/2023 Prepared by: Lonni Pall  Exercises - Seated Posterior Pelvic Tilt  - 1 x daily - 7 x weekly - 2 sets - 10 reps - 2 sec  hold - Bird Dog on Counter  - 3-4 x weekly - 3 sets - 10 reps - Supine Bridge  - 1 x daily - 3-4 x weekly - 2-3 sets - 10 reps - Side Plank on Knees  - 1 x daily - 3-4 x weekly - 2-3 sets - 10 reps - Mini Squat  - 1 x daily - 3-4 x weekly - 2-3 sets - 10-12 reps - Supine Dead Bug with Leg Extension  - 1 x daily - 3-4 x weekly - 2-3 sets - 10-12 reps  Access Code: [email or web address] URL: [email or web address] Date: 05/29/2023 Prepared by: Lonni Pall  Exercises - Squat with Chair Touch  - 3-4 x weekly - 3 sets - 10 reps - Seated  Posterior Pelvic Tilt  - 1 x daily - 7 x weekly - 2 sets - 10 reps - 2 sec  hold - Bird Dog on Counter  - 3-4 x weekly - 3 sets - 10 reps - Supine Bridge  - 1 x daily - 7 x weekly - 2-3 sets - 10 reps -  Side Plank on Knees  - 1 x daily - 7 x weekly - 2-3 sets - 10 reps  Access Code: [email or web address] URL: [email or web address] Date: 05/21/2023 Prepared by: Toribio Servant  Exercises - Squat with Chair Touch  - 3-4 x weekly - 3 sets - 10 reps - Seated Posterior Pelvic Tilt  - 1 x daily - 7 x weekly - 2 sets - 10 reps - 2 sec  hold - Bird Dog on Counter  - 3-4 x weekly - 3 sets - 10 reps  ASSESSMENT:  CLINICAL IMPRESSION: Continued PT POC with focus on improving functional strength and reducing lumbar pain. Pt tolerated all interventions without exacerbation of lower back pain or abdominal pain. PT focused on functional strengthening and core stabilization with dynamic movements in order to improve lower back pain while performing ADLs. Good form with Kettlebell dead lift without LBP. PT plans to continue challenging patient's core stabilization while performing more dynamic movements. She still experiences intermittent bouts of lower back pain and abdominal pain with increased activity throughout the day. Based on today's performance, pt will continue to benefit from skilled PT in order to facilitate return to PLOF and improve QoL.   OBJECTIVE IMPAIRMENTS: decreased strength, impaired flexibility, and obesity.   ACTIVITY LIMITATIONS: carrying, lifting, bending, squatting, and caring for others  PARTICIPATION LIMITATIONS: cleaning, community activity, and yard work  PERSONAL FACTORS: 3+ comorbidities: Depression, anxiety and HTN are also affecting patient's functional outcome.   REHAB POTENTIAL: Good  CLINICAL DECISION MAKING: Stable/uncomplicated  EVALUATION COMPLEXITY: Low   GOALS: Goals reviewed with patient? No  SHORT TERM GOALS: Target date: 05/31/2023  Patient will  demonstrate undestanding of home exercise plan by performing exercises correctly with evidence of good carry over with min to no verbal or tactile cues .   Baseline: NT 05/21/23: Able to perform independently  Goal status: ONGOING   2.  Patient will be able to demonstrate understanding of transverse abdominis activation by performing posterior pelvic tilt in sitting with no cueing and without holding breath.   Baseline: Able to perform posterior pelvic tilt but requires holding breath Goal status: MET     LONG TERM GOALS: Target date: 09/26/2023  Patient will demonstrate an improvement in SF-36 score by >=10% indicating a decrease in disability and improvement in overall function.  Baseline:  SF-36 -Physical Functioning: 20% -Role Limitations due to physical health: 50% -Role Limitations due to emotional health: 33% -Energy/fatigue: 10% -Emotional well being: 32% -Social Functioning: 13% -Pain: 58% -General Health: 40% -Health Change: 50%  07/23/23: Physical functioning: 45 % Role limitations due to physical health: 25 % Role limitations due to emotional problems: 33.3 % Energy/fatigue: 25 % Emotional well-being: 28 % Social functioning: 37.5 % Pain: 45 % General health: 30 % Health change: 25 %  Goal status: ONGOING   2.  Patient will improve abdominal strength by 1 level on Sahrmann test as evidence of improved core stability and mitigation of diastasis recti.  Baseline: Level 4; 05/27/205: Level 5 Goal status: ONGOING   3.  Patient will demonstrate understanding of how to utilize core stability and proper hip hinge technique while lifting objects that weight similarly to her 39 and 13 year old children or >=40 lbs from floor into standing.  Baseline: #40 KB lift increased lordosis with dead lift; 07/23/23: 40# KB with proper form Goal status: MET    4.  Patient will improve hip strength by >=1/3  grade (ie 4 to 4-) for improved lumbar and pelvic  stability   Baseline:  Hip Ext R/L 4-/4-, Hip Abd R/L 4/4, Hip Add R/L 4-/4-; 07/10/2023: Hip Abd R/L 5/4+; Hip Add R/L 4+/4+; Hip Ext R/L 4+/4+  Goal status: MET     PLAN:  PT FREQUENCY: 1-2x/week  PT DURATION: 6 weeks  PLANNED INTERVENTIONS: 97164- PT Re-evaluation, 97110-Therapeutic exercises, 97530- Therapeutic activity, 97112- Neuromuscular re-education, 97535- Self Care, [id number]- Manual therapy, [phone]- Gait training, [phone]- Orthotic Fit/training, [phone]- Aquatic Therapy, Patient/Family education, Balance training, Stair training, Dry Needling, Joint mobilization, Joint manipulation, Spinal manipulation, Spinal mobilization, Vestibular training, DME instructions, Cryotherapy, and Moist heat.  PLAN FOR NEXT SESSION:  Progress functional strength and core stability training.  Continue training hip hinge for improved biomechanics when lifting. TA activation with diaphragmatic breathing. Seated Yoga ball in order to activate TrA.    Lonni Pall PT, DPT [facility] [address] [place], [address] Phone: [phone]   Fax:  [phone]

## 2023-08-13 ENCOUNTER — Encounter: Admitting: Physical Therapy

## 2023-08-13 ENCOUNTER — Ambulatory Visit

## 2023-08-13 DIAGNOSIS — Z1331 Encounter for screening for depression: Secondary | ICD-10-CM | POA: Diagnosis not present

## 2023-08-13 DIAGNOSIS — M546 Pain in thoracic spine: Secondary | ICD-10-CM | POA: Diagnosis not present

## 2023-08-13 DIAGNOSIS — Z1151 Encounter for screening for human papillomavirus (HPV): Secondary | ICD-10-CM | POA: Diagnosis not present

## 2023-08-13 DIAGNOSIS — M6208 Separation of muscle (nontraumatic), other site: Secondary | ICD-10-CM

## 2023-08-13 DIAGNOSIS — M6281 Muscle weakness (generalized): Secondary | ICD-10-CM

## 2023-08-13 DIAGNOSIS — Z1339 Encounter for screening examination for other mental health and behavioral disorders: Secondary | ICD-10-CM | POA: Diagnosis not present

## 2023-08-13 DIAGNOSIS — M5459 Other low back pain: Secondary | ICD-10-CM

## 2023-08-13 DIAGNOSIS — Z113 Encounter for screening for infections with a predominantly sexual mode of transmission: Secondary | ICD-10-CM | POA: Diagnosis not present

## 2023-08-13 DIAGNOSIS — Z124 Encounter for screening for malignant neoplasm of cervix: Secondary | ICD-10-CM | POA: Diagnosis not present

## 2023-08-13 DIAGNOSIS — Z01419 Encounter for gynecological examination (general) (routine) without abnormal findings: Secondary | ICD-10-CM | POA: Diagnosis not present

## 2023-08-13 NOTE — Therapy (Signed)
 OUTPATIENT PHYSICAL THERAPY THORACOLUMBAR TREATMENT  Patient Name: SHALAH ESTELLE MRN: [id number] DOB:[birth year], 43 y.o., female Today's Date: 08/13/2023  END OF SESSION:      PT End of Session - 08/13/23 0903     Visit Number 14    Number of Visits 28    Date for PT Re-Evaluation 09/26/23    Authorization Type [place] [employer] [id number] for 12 PT vsts    Authorization Time Period 6/16-8/16    Authorization - Visit Number 3    Authorization - Number of Visits 12    Progress Note Due on Visit 20    PT Start Time 0902    PT Stop Time 0943    PT Time Calculation (min) 41 min    Activity Tolerance Patient tolerated treatment well    Behavior During Therapy [facility] for tasks assessed/performed          Past Medical History:  Diagnosis Date   Epilepsy (HCC)    Fibroids 05/14/2015   Rapid heart beat    Scoliosis    Scoliosis    Tachycardia    Past Surgical History:  Procedure Laterality Date   CHOLECYSTECTOMY     GALLBLADDER SURGERY     TUBAL LIGATION N/A 12/02/2020   Procedure: POST PARTUM TUBAL LIGATION;  Surgeon: Lovetta Debby PARAS, MD;  Location: [facility] ORS;  Service: Gynecology;  Laterality: N/A;   Patient Active Problem List   Diagnosis Date Noted   NSVD (normal spontaneous vaginal delivery) 12/03/2020   S/P tubal ligation 12/03/2020   Elevated blood-pressure reading without diagnosis of hypertension 12/26/2019   Encounter for supervision of high risk pregnancy due to advanced maternal age in primigravida 07/03/2019   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 Juliane Costa   REFERRING PROVIDER: Dr. Henriette Pierre    REFERRING DIAG: M62.08 (ICD-10-CM) - Separation of muscle (nontraumatic), other site  Rationale for Evaluation and Treatment: Rehabilitation  THERAPY DIAG:  Diastasis recti  Muscle weakness (generalized)  Other low back pain  ONSET DATE: 10+ years ago   SUBJECTIVE:                                                                                                                                                                                           SUBJECTIVE STATEMENT: Patient reports improved sleep last night.  No pain in her lower back at the start of session. No further questions or concerns.   PERTINENT HISTORY:  Pt started experiencing diastasis recti around her third child in 2010. Since then she has been managing it on her own. She also has a umbilical hernia that was confirmed by Dr. Henriette Pierre on March 19th, 2025. He gave her permission to initiate PT before exploring further interventions. She has not experienced increased pain or discomfort because of the hernia or diastasis recti.   PAIN:  Are you having pain? No  PRECAUTIONS: Avoid positions that exacerbate dooming in area inside diastasis recti like crunches or leg lifts.   RED FLAGS: None   WEIGHT BEARING RESTRICTIONS: No  FALLS:  Has patient fallen in last 6 months? No  LIVING ENVIRONMENT: Lives with: lives with their family Lives in: Did not ask  Stairs: Did not ask  Has following equipment at home: None  OCCUPATION: Did not ask   PLOF: Independent  PATIENT GOALS: To reduce diastasis recti    NEXT MD VISIT: Not sure   OBJECTIVE:  Note: Objective measures were completed at Evaluation unless otherwise noted.  VITALS BP 118/74 HR 80 SpO2 100   DIAGNOSTIC FINDINGS:  None listed   PATIENT SURVEYS:  None performed   COGNITION: Overall cognitive status: Within functional limits for tasks assessed     SENSATION: WFL  MUSCLE LENGTH: Hamstrings: Right NT deg; Left NT deg Thomas test: Not  tested         Ely's Test: Positive Bilaterally   POSTURE: No Significant postural limitations  PALPATION: No areas tender to palpate    LOWER EXTREMITY MMT:    MMT Right eval Left eval  Hip flexion 4+ 4+  Hip extension 4- 4-  Hip abduction 4 4  Hip adduction 4- 4-  Hip internal rotation    Hip external rotation    Knee flexion 4+ 4+  Knee extension 4+ 4+  Ankle dorsiflexion    Ankle plantarflexion    Ankle inversion    Ankle eversion     (Blank rows = not tested)   FUNCTIONAL TESTS:  Sahrmann Test: Level 4     TREATMENT DATE: 08/13/2023   Therapeutic Exercise (Focused on core stabilization):  Supine 90-90 with Overhead Flexion using Weighted Dowel   3 x 10   Supine Dead bug 2 x 10 Alternating UE/LE  2 x 10, Alternating UE/LE with Red TB around feet (Minor Pain in the lower back), improved pain with Tactile cue for TrA activation   Pallof Press (OMEGA Cable)   1 x 15, 5#   2 x 15, 7# (minor back pain)    Therapeutic Activity:  Standing Alternating marches with Tidal Tank (external perturbations for core stabilization)   1 x 20 marches  2 x 20 marches on [employer]   3 x 10, 30#   Kettlebell Swings   3 x 10, 10#   Squat and reach > Med [employer] to the L   1 x 10, 2 Kg MB  1 x 10, 3 Kg MB  05/21/23: THEREX  TM Warm up 1.0 mph for 5 min  OMEGA Palloff Press #10 3 x 10  Bird Dog against counter 2 x 10  -min VC to reach forward instead of out to side  Seated Anterior Pelvic Tilt with 2 sec hold 2 x 10   Physical Performance  SF-36 Assessment:  -Physical Functioning: 20% -Role Limitations due to physical health:  50% -Role Limitations due to emotional health: 33% -Energy/fatigue: 10% -Emotional well being: 32% -Social Functioning: 13% -Pain: 58% -General Health: 40% -Health Change: 50%    05/16/23:                                                                                                                                THEREX   90/90 Supine Heel Touches 2 x 10  -dooming in diastasis recti with no symptoms Bird Dog 2 x 10    PATIENT EDUCATION:  Education details: Form and technique for appropriate exercise, HEP  Person educated: Patient Education method: Programmer, multimedia, Demonstration, Verbal cues, and Handouts Education comprehension: verbalized understanding, returned demonstration, and verbal cues required  HOME EXERCISE PROGRAM: Access Code: [email or web address] URL: [email or web address] Date: 06/28/2023 Prepared by: Lonni Pall  Exercises - Supine Bridge  - 1 x daily - 3-4 x weekly - 2-3 sets - 10-12 reps - Alternating Single Leg Bridge  - 1 x daily - 3-4 x weekly - 2-3 sets - 10-12 reps - Mini Squat  - 1 x daily - 3-4 x weekly - 2-3 sets - 10-12 reps - Supine Dead Bug with Leg Extension  - 1 x daily - 3-4 x weekly - 2-3 sets - 10-12 reps - Single-Leg Anti-Rotation Press With Anchored Resistance  - 1 x daily - 3-4 x weekly - 2-3 sets - 10-12 reps  Access Code: [email or web address] URL: [email or web address] Date: 06/12/2023 Prepared by: Lonni Pall  Exercises - Seated Posterior Pelvic Tilt  - 1 x daily - 7 x weekly - 2 sets - 10 reps - 2 sec  hold - Bird Dog on Counter  - 3-4 x weekly - 3 sets - 10 reps - Supine Bridge  - 1 x daily - 3-4 x weekly - 2-3 sets - 10 reps - Side Plank on Knees  - 1 x daily - 3-4 x weekly - 2-3 sets - 10 reps - Mini Squat  - 1 x daily - 3-4 x weekly - 2-3 sets - 10-12 reps - Supine Dead Bug with Leg Extension  - 1 x daily - 3-4 x weekly - 2-3 sets - 10-12 reps  Access Code: [email or web address] URL: [email or web address] Date: 05/29/2023 Prepared by: Lonni Pall  Exercises - Squat with Chair Touch  - 3-4 x weekly - 3 sets - 10 reps - Seated Posterior Pelvic Tilt  - 1 x daily - 7 x weekly - 2 sets - 10 reps - 2 sec  hold - Bird Dog on Counter  - 3-4 x weekly - 3 sets - 10 reps - Supine Bridge  - 1 x daily - 7 x weekly - 2-3 sets - 10  reps - Side Plank on Knees  - 1 x daily - 7 x weekly - 2-3 sets - 10 reps  Access Code: [email or web address] URL: [email or web address] Date: 05/21/2023 Prepared by: Toribio Servant  Exercises - Squat with Chair Touch  - 3-4 x weekly -  3 sets - 10 reps - Seated Posterior Pelvic Tilt  - 1 x daily - 7 x weekly - 2 sets - 10 reps - 2 sec  hold - Bird Dog on Counter  - 3-4 x weekly - 3 sets - 10 reps  ASSESSMENT:  CLINICAL IMPRESSION: Continued PT POC with focus on improving functional strength and reducing lumbar pain. Additional dynamic movements tolerated in today's session. Pt demonstrated proper core stabilization with controlled momentum during med ball throws. Tidal tank utilized to simulate reactive balance training with external perturbation.  Patient demonstrated good tolerance to all interventions with minor transient low back symptoms during dead bug and pallof press however pain improved to tactile cueing for TrA activation. She still experiences intermittent bouts of lower back pain and abdominal pain with increased activity throughout the day. Based on today's performance, pt will continue to benefit from skilled PT in order to facilitate return to PLOF and improve QoL.   OBJECTIVE IMPAIRMENTS: decreased strength, impaired flexibility, and obesity.   ACTIVITY LIMITATIONS: carrying, lifting, bending, squatting, and caring for others  PARTICIPATION LIMITATIONS: cleaning, community activity, and yard work  PERSONAL FACTORS: 3+ comorbidities: Depression, anxiety and HTN are also affecting patient's functional outcome.   REHAB POTENTIAL: Good  CLINICAL DECISION MAKING: Stable/uncomplicated  EVALUATION COMPLEXITY: Low   GOALS: Goals reviewed with patient? No  SHORT TERM GOALS: Target date: 05/31/2023  Patient will demonstrate undestanding of home exercise plan by performing exercises correctly with evidence of good carry over with min to no verbal or tactile cues .    Baseline: NT 05/21/23: Able to perform independently  Goal status: ONGOING   2.  Patient will be able to demonstrate understanding of transverse abdominis activation by performing posterior pelvic tilt in sitting with no cueing and without holding breath.   Baseline: Able to perform posterior pelvic tilt but requires holding breath Goal status: MET     LONG TERM GOALS: Target date: 09/26/2023  Patient will demonstrate an improvement in SF-36 score by >=10% indicating a decrease in disability and improvement in overall function.  Baseline:  SF-36 -Physical Functioning: 20% -Role Limitations due to physical health: 50% -Role Limitations due to emotional health: 33% -Energy/fatigue: 10% -Emotional well being: 32% -Social Functioning: 13% -Pain: 58% -General Health: 40% -Health Change: 50%  2023-08-07: Physical functioning: 45 % Role limitations due to physical health: 25 % Role limitations due to emotional problems: 33.3 % Energy/fatigue: 25 % Emotional well-being: 28 % Social functioning: 37.5 % Pain: 45 % General health: 30 % Health change: 25 %  Goal status: ONGOING   2.  Patient will improve abdominal strength by 1 level on Sahrmann test as evidence of improved core stability and mitigation of diastasis recti.  Baseline: Level 4; 05/27/205: Level 5 Goal status: ONGOING   3.  Patient will demonstrate understanding of how to utilize core stability and proper hip hinge technique while lifting objects that weight similarly to her 31 and 28 year old children or >=40 lbs from floor into standing.  Baseline: #40 KB lift increased lordosis with dead lift; 08-07-23: 40# KB with proper form Goal status: MET    4.  Patient will improve hip strength by >=1/3  grade (ie 4 to 4-) for improved lumbar and pelvic stability   Baseline: Hip Ext R/L 4-/4-, Hip Abd R/L 4/4, Hip Add R/L 4-/4-; 08/07/23: Hip Abd R/L 5/4+; Hip Add R/L 4+/4+; Hip Ext R/L 4+/4+  Goal status: MET      PLAN:  PT FREQUENCY: 1-2x/week  PT DURATION: 6 weeks  PLANNED INTERVENTIONS: 97164- PT Re-evaluation, 97110-Therapeutic exercises, 97530- Therapeutic activity, 97112- Neuromuscular re-education, 97535- Self Care, [id number]- Manual therapy, [phone]- Gait training, [phone]- Orthotic Fit/training, [phone]- Aquatic Therapy, Patient/Family education, Balance training, Stair training, Dry Needling, Joint mobilization, Joint manipulation, Spinal manipulation, Spinal mobilization, Vestibular training, DME instructions, Cryotherapy, and Moist heat.  PLAN FOR NEXT SESSION:  Progress functional strength and core stability training.  Continue training hip hinge for improved biomechanics when lifting. TA activation with diaphragmatic breathing.   Lonni Pall PT, DPT [facility] [address] [place], [address] Phone: [phone]   Fax:  [phone]

## 2023-08-15 ENCOUNTER — Ambulatory Visit: Attending: Surgery

## 2023-08-15 DIAGNOSIS — M546 Pain in thoracic spine: Secondary | ICD-10-CM | POA: Diagnosis not present

## 2023-08-15 DIAGNOSIS — M6208 Separation of muscle (nontraumatic), other site: Secondary | ICD-10-CM | POA: Diagnosis not present

## 2023-08-15 DIAGNOSIS — M6281 Muscle weakness (generalized): Secondary | ICD-10-CM | POA: Insufficient documentation

## 2023-08-15 DIAGNOSIS — M5459 Other low back pain: Secondary | ICD-10-CM | POA: Diagnosis not present

## 2023-08-15 NOTE — Therapy (Signed)
 OUTPATIENT PHYSICAL THERAPY THORACOLUMBAR TREATMENT  Patient Name: Alexandra Heath MRN: [id number] DOB:01/31/1981, 43 y.o., female Today's Date: 08/15/2023  END OF SESSION:      PT End of Session - 08/15/23 0948     Visit Number 15    Number of Visits 28    Date for PT Re-Evaluation 09/26/23    Authorization Type [employer] [id number] for 12 PT vsts    Authorization Time Period 6/16-8/16    Authorization - Visit Number 4    Authorization - Number of Visits 12    Progress Note Due on Visit 20    PT Start Time 0946    PT Stop Time 1025    PT Time Calculation (min) 39 min    Activity Tolerance Patient tolerated treatment well    Behavior During Therapy [facility] for tasks assessed/performed          Past Medical History:  Diagnosis Date   Epilepsy (HCC)    Fibroids 05/14/2015   Rapid heart beat    Scoliosis    Scoliosis    Tachycardia    Past Surgical History:  Procedure Laterality Date   CHOLECYSTECTOMY     GALLBLADDER SURGERY     TUBAL LIGATION N/A 12/02/2020   Procedure: POST PARTUM TUBAL LIGATION;  Surgeon: Lovetta Debby PARAS, MD;  Location: [facility] ORS;  Service: Gynecology;  Laterality: N/A;   Patient Active Problem List   Diagnosis Date Noted   NSVD (normal spontaneous vaginal delivery) 12/03/2020   S/P tubal ligation 12/03/2020   Elevated blood-pressure reading without diagnosis of hypertension 12/26/2019   Encounter for supervision of high risk pregnancy due to advanced maternal age in primigravida 07/03/2019   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 Juliane Costa   REFERRING PROVIDER: Dr. Henriette Pierre    REFERRING DIAG: M62.08 (ICD-10-CM) - Separation of muscle (nontraumatic), other site  Rationale for Evaluation and Treatment: Rehabilitation  THERAPY DIAG:  Diastasis recti  Muscle weakness (generalized)  Other low back pain  Pain in thoracic spine  ONSET DATE: 10+ years ago   SUBJECTIVE:                                                                                                                                                                                           SUBJECTIVE STATEMENT: Patient  reports 4/10 pain in the lower back today, didn't get enough rest from last night (Slept at 1am) due to family commitments. No further questions or concerns.   PERTINENT HISTORY:  Pt started experiencing diastasis recti around her third child in 2010. Since then she has been managing it on her own. She also has a umbilical hernia that was confirmed by Dr. Henriette Pierre on March 19th, 2025. He gave her permission to initiate PT before exploring further interventions. She has not experienced increased pain or discomfort because of the hernia or diastasis recti.   PAIN:  Are you having pain? No  PRECAUTIONS: Avoid positions that exacerbate dooming in area inside diastasis recti like crunches or leg lifts.   RED FLAGS: None   WEIGHT BEARING RESTRICTIONS: No  FALLS:  Has patient fallen in last 6 months? No  LIVING ENVIRONMENT: Lives with: lives with their family Lives in: Did not ask  Stairs: Did not ask  Has following equipment at home: None  OCCUPATION: Did not ask   PLOF: Independent  PATIENT GOALS: To reduce diastasis recti    NEXT MD VISIT: Not sure   OBJECTIVE:  Note: Objective measures were completed at Evaluation unless otherwise noted.  VITALS BP 118/74 HR 80 SpO2 100   DIAGNOSTIC FINDINGS:  None listed   PATIENT SURVEYS:  None performed   COGNITION: Overall cognitive status: Within functional limits for tasks assessed     SENSATION: WFL  MUSCLE  LENGTH: Hamstrings: Right NT deg; Left NT deg Thomas test: Not tested         Ely's Test: Positive Bilaterally   POSTURE: No Significant postural limitations  PALPATION: No areas tender to palpate    LOWER EXTREMITY MMT:    MMT Right eval Left eval  Hip flexion 4+ 4+  Hip extension 4- 4-  Hip abduction 4 4  Hip adduction 4- 4-  Hip internal rotation    Hip external rotation    Knee flexion 4+ 4+  Knee extension 4+ 4+  Ankle dorsiflexion    Ankle plantarflexion    Ankle inversion    Ankle eversion     (Blank rows = not tested)   FUNCTIONAL TESTS:  Sahrmann Test: Level 4     TREATMENT DATE: 08/15/2023   Therapeutic Exercise (Focused on core stabilization):  Quadruped Thoracic Rotation - Reach Under for thoracolumabar mobility   R/L: 1 x 10   Supine Bridges with feet on foam roller   3 x 10   Supine 90-90 with Overhead Flexion using Weighted Dowel   3 x 10, 8#   Pallof Press (OMEGA Cable)   Resistance from L: 2 x 10, 7#, 1 x 5 10#   Resistance from R: 2 x 10, 7#, 1 x 5 10#   Time Spent reviewing HEP and additional exercises for functional LE strength and increasing core stabilization.    Therapeutic Activity:  Standing Alternating Marches  1 x 20, 20# KB in RUE, level surface 1 x 20, 20# KB in RUE, on airex pad  1 x 20, 10# KB in RUE Overhead, on airex pad   Kettlebell Squats from 8 Stool   2 x 8, 40#    Kettlebell Swings   2 x 10, 20#    Wall Sit (supported with Green swiss ball)   3 x 15s  05/21/23: THEREX  TM Warm up 1.0 mph for 5 min  OMEGA Palloff Press #10 3 x 10  Bird Dog against counter 2 x 10  -  min VC to reach forward instead of out to side  Seated Anterior Pelvic Tilt with 2 sec hold 2 x 10   Physical Performance  SF-36 Assessment:  -Physical Functioning: 20% -Role Limitations due to physical health: 50% -Role Limitations due to emotional health: 33% -Energy/fatigue: 10% -Emotional well being: 32% -Social Functioning:  13% -Pain: 58% -General Health: 40% -Health Change: 50%    05/16/23:                                                                                                                               THEREX   90/90 Supine Heel Touches 2 x 10  -dooming in diastasis recti with no symptoms Bird Dog 2 x 10    PATIENT EDUCATION:  Education details: Form and technique for appropriate exercise, HEP  Person educated: Patient Education method: Programmer, multimedia, Demonstration, Verbal cues, and Handouts Education comprehension: verbalized understanding, returned demonstration, and verbal cues required  HOME EXERCISE PROGRAM: Access Code: [email or web address] URL: [email or web address] Date: 08/15/2023 Prepared by: Lonni Pall  Exercises - Supine Bridge with Resistance Band  - 1 x daily - 3-4 x weekly - 2-3 sets - 10-12 reps - 2s hold - Alternating Single Leg Bridge  - 1 x daily - 3-4 x weekly - 2-3 sets - 10-12 reps - Mini Squat  - 1 x daily - 3-4 x weekly - 2-3 sets - 10-12 reps - Supine Dead Bug with Leg Extension  - 1 x daily - 3-4 x weekly - 2-3 sets - 10-12 reps - Wall Quarter Squat  - 1 x daily - 3-4 x weekly - 3 sets - 15-20s hold - Kettlebell Squat  - 1 x daily - 3-4 x weekly - 2-3 sets - 10-12 reps  Access Code: [email or web address] URL: [email or web address] Date: 06/28/2023 Prepared by: Lonni Pall  Exercises - Supine Bridge  - 1 x daily - 3-4 x weekly - 2-3 sets - 10-12 reps - Alternating Single Leg Bridge  - 1 x daily - 3-4 x weekly - 2-3 sets - 10-12 reps - Mini Squat  - 1 x daily - 3-4 x weekly - 2-3 sets - 10-12 reps - Supine Dead Bug with Leg Extension  - 1 x daily - 3-4 x weekly - 2-3 sets - 10-12 reps - Single-Leg Anti-Rotation Press With Anchored Resistance  - 1 x daily - 3-4 x weekly - 2-3 sets - 10-12 reps    ASSESSMENT:  CLINICAL IMPRESSION: Continued PT POC with focus on improving functional strength and reducing lumbar pain. PT focused on increasing  functional strength in LE. Demonstrated tolerance to increase in resistance with Kettle bell swings, while maintaining proper TrA activation.  Dynamic core stabilization targeted with anitirotation exercises; improvements from last session as she had no pain while performing Pallof Press. Updated HEP today and encouraged adherence to HEP.She still experiences intermittent bouts of lower back pain and abdominal pain with increased activity throughout the day. Based on today's  performance, pt will continue to benefit from skilled PT in order to facilitate return to PLOF and improve QoL.   OBJECTIVE IMPAIRMENTS: decreased strength, impaired flexibility, and obesity.   ACTIVITY LIMITATIONS: carrying, lifting, bending, squatting, and caring for others  PARTICIPATION LIMITATIONS: cleaning, community activity, and yard work  PERSONAL FACTORS: 3+ comorbidities: Depression, anxiety and HTN are also affecting patient's functional outcome.   REHAB POTENTIAL: Good  CLINICAL DECISION MAKING: Stable/uncomplicated  EVALUATION COMPLEXITY: Low   GOALS: Goals reviewed with patient? No  SHORT TERM GOALS: Target date: 05/31/2023  Patient will demonstrate undestanding of home exercise plan by performing exercises correctly with evidence of good carry over with min to no verbal or tactile cues .   Baseline: NT 05/21/23: Able to perform independently  Goal status: ONGOING   2.  Patient will be able to demonstrate understanding of transverse abdominis activation by performing posterior pelvic tilt in sitting with no cueing and without holding breath.   Baseline: Able to perform posterior pelvic tilt but requires holding breath Goal status: MET     LONG TERM GOALS: Target date: 09/26/2023  Patient will demonstrate an improvement in SF-36 score by >=10% indicating a decrease in disability and improvement in overall function.  Baseline:  SF-36 -Physical Functioning: 20% -Role Limitations due to physical  health: 50% -Role Limitations due to emotional health: 33% -Energy/fatigue: 10% -Emotional well being: 32% -Social Functioning: 13% -Pain: 58% -General Health: 40% -Health Change: 50%  07-13-23: Physical functioning: 45 % Role limitations due to physical health: 25 % Role limitations due to emotional problems: 33.3 % Energy/fatigue: 25 % Emotional well-being: 28 % Social functioning: 37.5 % Pain: 45 % General health: 30 % Health change: 25 %  Goal status: ONGOING   2.  Patient will improve abdominal strength by 1 level on Sahrmann test as evidence of improved core stability and mitigation of diastasis recti.  Baseline: Level 4; 05/27/205: Level 5 Goal status: ONGOING   3.  Patient will demonstrate understanding of how to utilize core stability and proper hip hinge technique while lifting objects that weight similarly to her 63 and 29 year old children or >=40 lbs from floor into standing.  Baseline: #40 KB lift increased lordosis with dead lift; 07/13/23: 40# KB with proper form Goal status: MET    4.  Patient will improve hip strength by >=1/3  grade (ie 4 to 4-) for improved lumbar and pelvic stability   Baseline: Hip Ext R/L 4-/4-, Hip Abd R/L 4/4, Hip Add R/L 4-/4-; 13-Jul-2023: Hip Abd R/L 5/4+; Hip Add R/L 4+/4+; Hip Ext R/L 4+/4+  Goal status: MET     PLAN:  PT FREQUENCY: 1-2x/week  PT DURATION: 6 weeks  PLANNED INTERVENTIONS: 97164- PT Re-evaluation, 97110-Therapeutic exercises, 97530- Therapeutic activity, 97112- Neuromuscular re-education, 97535- Self Care, 02859- Manual therapy, [phone]- Gait training, [phone]- Orthotic Fit/training, [phone]- Aquatic Therapy, Patient/Family education, Balance training, Stair training, Dry Needling, Joint mobilization, Joint manipulation, Spinal manipulation, Spinal mobilization, Vestibular training, DME instructions, Cryotherapy, and Moist heat.  PLAN FOR NEXT SESSION:  Progress functional strength and core stability training.   Continue training hip hinge for improved biomechanics when lifting. TA activation with diaphragmatic breathing.   Lonni Pall PT, DPT [facility] [address] [place], [address] Phone: [phone]   Fax:  [phone]

## 2023-08-20 ENCOUNTER — Ambulatory Visit

## 2023-08-22 ENCOUNTER — Ambulatory Visit

## 2023-08-22 DIAGNOSIS — M6281 Muscle weakness (generalized): Secondary | ICD-10-CM | POA: Diagnosis not present

## 2023-08-22 DIAGNOSIS — M5459 Other low back pain: Secondary | ICD-10-CM | POA: Diagnosis not present

## 2023-08-22 DIAGNOSIS — M546 Pain in thoracic spine: Secondary | ICD-10-CM

## 2023-08-22 DIAGNOSIS — M6208 Separation of muscle (nontraumatic), other site: Secondary | ICD-10-CM

## 2023-08-22 NOTE — Therapy (Signed)
 OUTPATIENT PHYSICAL THERAPY THORACOLUMBAR TREATMENT  Patient Name: Alexandra Heath MRN: [id number] DOB:02-Apr-1980, 43 y.o., female Today's Date: 08/22/2023  END OF SESSION:      PT End of Session - 08/22/23 0858     Visit Number 16    Number of Visits 28    Date for PT Re-Evaluation 09/26/23    Authorization Type [employer] [id number]#[id number] for 12 PT vsts    Authorization Time Period 6/16-8/16    Authorization - Visit Number 5    Authorization - Number of Visits 12    Progress Note Due on Visit 20    PT Start Time 0859    PT Stop Time 0920    PT Time Calculation (min) 21 min    Activity Tolerance Patient limited by pain    Behavior During Therapy [facility] for tasks assessed/performed          Past Medical History:  Diagnosis Date   Epilepsy (HCC)    Fibroids 05/14/2015   Rapid heart beat    Scoliosis    Scoliosis    Tachycardia    Past Surgical History:  Procedure Laterality Date   CHOLECYSTECTOMY     GALLBLADDER SURGERY     TUBAL LIGATION N/A 12/02/2020   Procedure: POST PARTUM TUBAL LIGATION;  Surgeon: Lovetta Debby PARAS, MD;  Location: [facility] ORS;  Service: Gynecology;  Laterality: N/A;   Patient Active Problem List   Diagnosis Date Noted   NSVD (normal spontaneous vaginal delivery) 12/03/2020   S/P tubal ligation 12/03/2020   Elevated blood-pressure reading without diagnosis of hypertension 12/26/2019   Encounter for supervision of high risk pregnancy due to advanced maternal age in primigravida 07/03/2019   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 Juliane Costa   REFERRING PROVIDER: Dr. Henriette Pierre    REFERRING DIAG: M62.08 (ICD-10-CM) - Separation of muscle (nontraumatic), other site  Rationale for Evaluation and Treatment: Rehabilitation  THERAPY DIAG:  Diastasis recti  Muscle weakness (generalized)  Other low back pain  Pain in thoracic spine  ONSET DATE: 10+ years ago   SUBJECTIVE:                                                                                                                                                                                           SUBJECTIVE STATEMENT: Patient  reports 9/10 NPS in the mid back this past weekend. Pain began on Sunday and hasn't improved since that day. Patient reports that she had to prop herself up with multiple pillows. Pt tender to touch along mid scapular bilaterally. Patient taking muscle relaxers yesterday in order to mitigate the pain. Patient denies falling, lifting excessive weighted items or MOI. Insidious onset while waiting in line at [facility]. No further questions or concerns.   PERTINENT HISTORY:  Pt started experiencing diastasis recti around her third child in 2010. Since then she has been managing it on her own. She also has a umbilical hernia that was confirmed by Dr. Henriette Pierre on March 19th, 2025. He gave her permission to initiate PT before exploring further interventions. She has not experienced increased pain or discomfort because of the hernia or diastasis recti.   PAIN:  Are you having pain? No  PRECAUTIONS: Avoid positions that exacerbate dooming in area inside diastasis recti like crunches or leg lifts.   RED FLAGS: None   WEIGHT BEARING RESTRICTIONS: No  FALLS:  Has patient fallen in last 6 months? No  LIVING ENVIRONMENT: Lives with: lives with their family Lives in: Did not ask  Stairs: Did not ask  Has following equipment at home: None  OCCUPATION: Did not ask   PLOF: Independent  PATIENT GOALS: To reduce diastasis recti    NEXT MD VISIT: Not sure   OBJECTIVE:  Note:  Objective measures were completed at Evaluation unless otherwise noted.  VITALS BP 118/74 HR 80 SpO2 100   DIAGNOSTIC FINDINGS:  None listed   PATIENT SURVEYS:  None performed   COGNITION: Overall cognitive status: Within functional limits for tasks assessed     SENSATION: WFL  MUSCLE LENGTH: Hamstrings: Right NT deg; Left NT deg Thomas test: Not tested         Ely's Test: Positive Bilaterally   POSTURE: No Significant postural limitations  PALPATION: No areas tender to palpate    LOWER EXTREMITY MMT:    MMT Right eval Left eval  Hip flexion 4+ 4+  Hip extension 4- 4-  Hip abduction 4 4  Hip adduction 4- 4-  Hip internal rotation    Hip external rotation    Knee flexion 4+ 4+  Knee extension 4+ 4+  Ankle dorsiflexion    Ankle plantarflexion    Ankle inversion    Ankle eversion     (Blank rows = not tested)   FUNCTIONAL TESTS:  Sahrmann Test: Level 4     TREATMENT DATE: 08/22/2023   Manual Therapy (10 min billed):  Moderate STM applied to thoracic paraspinals, L rhomboids, mid trapezius, upper trapezius. Patient in sidelying position due to extreme pain in supine position.   Moderate to severe pain with palpation of musculature around thoracic spine. Patient severely limited to UE movements and scapular retractions. PT educated patient on heat modalities and continued scap retractions when pain is reduced.    PATIENT EDUCATION:  Education details: Form and technique for appropriate exercise, HEP  Person educated: Patient Education method: Explanation, Demonstration, Verbal cues, and Handouts Education comprehension: verbalized understanding, returned demonstration, and verbal cues required  HOME EXERCISE PROGRAM: Access Code: [email or web address] URL: [email or web address] Date: 08/15/2023 Prepared by: Lonni Pall  Exercises - Supine Bridge with Resistance Band  - 1 x daily - 3-4 x weekly - 2-3 sets - 10-12 reps - 2s hold - Alternating  Single Leg Bridge  - 1 x daily - 3-4 x weekly - 2-3 sets - 10-12  reps - Mini Squat  - 1 x daily - 3-4 x weekly - 2-3 sets - 10-12 reps - Supine Dead Bug with Leg Extension  - 1 x daily - 3-4 x weekly - 2-3 sets - 10-12 reps - Wall Quarter Squat  - 1 x daily - 3-4 x weekly - 3 sets - 15-20s hold - Kettlebell Squat  - 1 x daily - 3-4 x weekly - 2-3 sets - 10-12 reps  Access Code: [email or web address] URL: [email or web address] Date: 06/28/2023 Prepared by: Lonni Pall  Exercises - Supine Bridge  - 1 x daily - 3-4 x weekly - 2-3 sets - 10-12 reps - Alternating Single Leg Bridge  - 1 x daily - 3-4 x weekly - 2-3 sets - 10-12 reps - Mini Squat  - 1 x daily - 3-4 x weekly - 2-3 sets - 10-12 reps - Supine Dead Bug with Leg Extension  - 1 x daily - 3-4 x weekly - 2-3 sets - 10-12 reps - Single-Leg Anti-Rotation Press With Anchored Resistance  - 1 x daily - 3-4 x weekly - 2-3 sets - 10-12 reps    ASSESSMENT:  CLINICAL IMPRESSION: Session limited due to extreme pain. Symptoms are aggravated by positional changes and are severe enough to require muscle relaxants and positional modification for sleep (unable to lay supine). Clinical examination reveals significant tenderness to palpation over the thoracic paraspinals, bilateral rhomboids, mid and upper trapezius, with notable limitation in upper extremity movement and scapular retraction secondary to pain. Manual therapy targeting thoracic paraspinals and scapular musculature was performed in sidelying due to the patient's inability to tolerate supine positioning. Moderate to severe pain persisted during palpation and treatment, indicating high tissue irritability. The patient was educated on the use of heat to reduce muscle guarding and advised to continue gentle scapular retraction exercises as tolerated to prevent further deconditioning and promote mobility once pain subsides. Palpation of thoracic spine without visible or palpable step off sign.  PT deferred additional interventions due to high irritability and encouraged patient to perform gentle scapular exercises. PT advised patient to follow up with PCP if pain worsens or is unrelenting. Based on today's performance, pt will continue to benefit from skilled PT in order to facilitate return to PLOF and improve QoL.   OBJECTIVE IMPAIRMENTS: decreased strength, impaired flexibility, and obesity.   ACTIVITY LIMITATIONS: carrying, lifting, bending, squatting, and caring for others  PARTICIPATION LIMITATIONS: cleaning, community activity, and yard work  PERSONAL FACTORS: 3+ comorbidities: Depression, anxiety and HTN are also affecting patient's functional outcome.   REHAB POTENTIAL: Good  CLINICAL DECISION MAKING: Stable/uncomplicated  EVALUATION COMPLEXITY: Low   GOALS: Goals reviewed with patient? No  SHORT TERM GOALS: Target date: 05/31/2023  Patient will demonstrate undestanding of home exercise plan by performing exercises correctly with evidence of good carry over with min to no verbal or tactile cues .   Baseline: NT 05/21/23: Able to perform independently  Goal status: ONGOING   2.  Patient will be able to demonstrate understanding of transverse abdominis activation by performing posterior pelvic tilt in sitting with no cueing and without holding breath.   Baseline: Able to perform posterior pelvic tilt but requires holding breath Goal status: MET     LONG TERM GOALS: Target date: 09/26/2023  Patient will demonstrate an improvement in SF-36 score by >=10% indicating a decrease in disability and improvement in overall function.  Baseline:  SF-36 -Physical Functioning: 20% -Role Limitations due to physical health: 50% -Role Limitations due to emotional  health: 33% -Energy/fatigue: 10% -Emotional well being: 32% -Social Functioning: 13% -Pain: 58% -General Health: 40% -Health Change: 50%  Jul 27, 2023: Physical functioning: 45 % Role limitations due to physical  health: 25 % Role limitations due to emotional problems: 33.3 % Energy/fatigue: 25 % Emotional well-being: 28 % Social functioning: 37.5 % Pain: 45 % General health: 30 % Health change: 25 %  Goal status: ONGOING   2.  Patient will improve abdominal strength by 1 level on Sahrmann test as evidence of improved core stability and mitigation of diastasis recti.  Baseline: Level 4; 05/27/205: Level 5 Goal status: ONGOING   3.  Patient will demonstrate understanding of how to utilize core stability and proper hip hinge technique while lifting objects that weight similarly to her 5 and 54 year old children or >=40 lbs from floor into standing.  Baseline: #40 KB lift increased lordosis with dead lift; 27-Jul-2023: 40# KB with proper form Goal status: MET    4.  Patient will improve hip strength by >=1/3  grade (ie 4 to 4-) for improved lumbar and pelvic stability   Baseline: Hip Ext R/L 4-/4-, Hip Abd R/L 4/4, Hip Add R/L 4-/4-; 07-27-2023: Hip Abd R/L 5/4+; Hip Add R/L 4+/4+; Hip Ext R/L 4+/4+  Goal status: MET     PLAN:  PT FREQUENCY: 1-2x/week  PT DURATION: 6 weeks  PLANNED INTERVENTIONS: 97164- PT Re-evaluation, 97110-Therapeutic exercises, 97530- Therapeutic activity, 97112- Neuromuscular re-education, 97535- Self Care, 02859- Manual therapy, [phone]- Gait training, [phone]- Orthotic Fit/training, [phone]- Aquatic Therapy, Patient/Family education, Balance training, Stair training, Dry Needling, Joint mobilization, Joint manipulation, Spinal manipulation, Spinal mobilization, Vestibular training, DME instructions, Cryotherapy, and Moist heat.  PLAN FOR NEXT SESSION:  Progress functional strength and core stability training.  Continue training hip hinge for improved biomechanics when lifting. TA activation with diaphragmatic breathing.   Lonni Pall PT, DPT [facility] [address] [place], [address] Phone: [phone]   Fax:   [phone]

## 2023-08-23 ENCOUNTER — Ambulatory Visit
Admission: RE | Admit: 2023-08-23 | Discharge: 2023-08-23 | Disposition: A | Discharge status: Home / Self Care | Source: Ambulatory Visit | Attending: Internal Medicine | Admitting: Internal Medicine

## 2023-08-23 DIAGNOSIS — Z1231 Encounter for screening mammogram for malignant neoplasm of breast: Secondary | ICD-10-CM | POA: Diagnosis not present

## 2023-08-27 ENCOUNTER — Ambulatory Visit

## 2023-08-27 DIAGNOSIS — M6281 Muscle weakness (generalized): Secondary | ICD-10-CM | POA: Diagnosis not present

## 2023-08-27 DIAGNOSIS — M546 Pain in thoracic spine: Secondary | ICD-10-CM | POA: Diagnosis not present

## 2023-08-27 DIAGNOSIS — M6208 Separation of muscle (nontraumatic), other site: Secondary | ICD-10-CM

## 2023-08-27 DIAGNOSIS — M5459 Other low back pain: Secondary | ICD-10-CM | POA: Diagnosis not present

## 2023-08-27 NOTE — Therapy (Signed)
 OUTPATIENT PHYSICAL THERAPY THORACOLUMBAR TREATMENT  Patient Name: Alexandra Heath MRN: [id number] DOB:1980/07/09, 43 y.o., female Today's Date: 08/27/2023  END OF SESSION:      PT End of Session - 08/27/23 0905     Visit Number 17    Number of Visits 28    Date for PT Re-Evaluation 09/26/23    Authorization Type [place] [employer] [id number] for 12 PT vsts    Authorization Time Period 6/16-8/16    Authorization - Number of Visits 12    Progress Note Due on Visit 20    PT Start Time 0905    PT Stop Time 0945    PT Time Calculation (min) 40 min    Activity Tolerance Patient limited by pain    Behavior During Therapy [facility] for tasks assessed/performed          Past Medical History:  Diagnosis Date   Epilepsy (HCC)    Fibroids 05/14/2015   Rapid heart beat    Scoliosis    Scoliosis    Tachycardia    Past Surgical History:  Procedure Laterality Date   CHOLECYSTECTOMY     GALLBLADDER SURGERY     TUBAL LIGATION N/A 12/02/2020   Procedure: POST PARTUM TUBAL LIGATION;  Surgeon: Lovetta Debby PARAS, MD;  Location: [facility] ORS;  Service: Gynecology;  Laterality: N/A;   Patient Active Problem List   Diagnosis Date Noted   NSVD (normal spontaneous vaginal delivery) 12/03/2020   S/P tubal ligation 12/03/2020   Elevated blood-pressure reading without diagnosis of hypertension 12/26/2019   Encounter for supervision of high risk pregnancy due to advanced maternal age in primigravida 07/03/2019   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 Juliane Costa   REFERRING PROVIDER: Dr. Henriette Pierre   REFERRING DIAG: M62.08 (ICD-10-CM) -  Separation of muscle (nontraumatic), other site  Rationale for Evaluation and Treatment: Rehabilitation  THERAPY DIAG:  Diastasis recti  Muscle weakness (generalized)  Other low back pain  Pain in thoracic spine  ONSET DATE: 10+ years ago   SUBJECTIVE:                                                                                                                                                                                           SUBJECTIVE STATEMENT: Patient reports 0/10 NPS in the upper back from  last week. Patient reports significant improvement in muscle from last week. Currently no pain in the abdominal muscles and lower back. No further questions or concerns.   PERTINENT HISTORY:  Pt started experiencing diastasis recti around her third child in 2010. Since then she has been managing it on her own. She also has a umbilical hernia that was confirmed by Dr. Henriette Pierre on March 19th, 2025. He gave her permission to initiate PT before exploring further interventions. She has not experienced increased pain or discomfort because of the hernia or diastasis recti.   PAIN:  Are you having pain? No  PRECAUTIONS: Avoid positions that exacerbate dooming in area inside diastasis recti like crunches or leg lifts.   RED FLAGS: None   WEIGHT BEARING RESTRICTIONS: No  FALLS:  Has patient fallen in last 6 months? No  LIVING ENVIRONMENT: Lives with: lives with their family Lives in: Did not ask  Stairs: Did not ask  Has following equipment at home: None  OCCUPATION: Did not ask   PLOF: Independent  PATIENT GOALS: To reduce diastasis recti    NEXT MD VISIT: Not sure   OBJECTIVE:  Note: Objective measures were completed at Evaluation unless otherwise noted.  VITALS BP 118/74 HR 80 SpO2 100   DIAGNOSTIC FINDINGS:  None listed   PATIENT SURVEYS:  None performed   COGNITION: Overall cognitive status: Within functional limits for tasks  assessed     SENSATION: WFL  MUSCLE LENGTH: Hamstrings: Right NT deg; Left NT deg Thomas test: Not tested         Ely's Test: Positive Bilaterally   POSTURE: No Significant postural limitations  PALPATION: No areas tender to palpate    LOWER EXTREMITY MMT:    MMT Right eval Left eval  Hip flexion 4+ 4+  Hip extension 4- 4-  Hip abduction 4 4  Hip adduction 4- 4-  Hip internal rotation    Hip external rotation    Knee flexion 4+ 4+  Knee extension 4+ 4+  Ankle dorsiflexion    Ankle plantarflexion    Ankle inversion    Ankle eversion     (Blank rows = not tested)   FUNCTIONAL TESTS:  Sahrmann Test: Level 4     TREATMENT DATE: 08/27/2023   Therapeutic Exercise:   Supine 90-90 with OH Reach using weighted dowel (8#)    1 x 10     Supine OH reach using weighted Dowel with alternate LE extension    3 x 10 8#   Supine 90-90 Torso Rotation with medball between knees   3 x 10, 2 Kg Med [employer] with Single UE/LE extension    2 x 10, 2 Kg Med ball in LUE    2 x 10, 2 kg  Med ball in RUE    [employer]:    Seated Lat Pull Down     1 x 10, 20#    1 x 10, 25#     Seated Shoulder Row (Wide Grip)     1 x 10, 20#    1 x 10, 25#      Therapeutic Activity:   Tall Kneeling on airex [place] Rotation   3 x 15, 25# Barbell    Tall Kneeling on airex, kettlebell torso passes    2 x 15 CW 20 # KB   1 x 15 CCW 20# KB   Kettle Bell Squat   2 x 8, 40# KB   1 x  10, 40# KB  PATIENT EDUCATION:  Education details: Form and technique for appropriate exercise, HEP  Person educated: Patient Education method: Explanation, Demonstration, Verbal cues, and Handouts Education comprehension: verbalized understanding, returned demonstration, and verbal cues required  HOME EXERCISE PROGRAM: Access Code: [email or web address] URL: [email or web address] Date: 08/27/2023 Prepared by: Lonni Pall  Exercises - Supine Bridge with Resistance Band  - 1 x  daily - 3-4 x weekly - 2-3 sets - 10-12 reps - 2s hold - Alternating Single Leg Bridge  - 1 x daily - 3-4 x weekly - 2-3 sets - 10-12 reps - Mini Squat  - 1 x daily - 3-4 x weekly - 2-3 sets - 10-12 reps - Supine Dead Bug with Leg Extension  - 1 x daily - 3-4 x weekly - 2-3 sets - 10-12 reps - Wall Quarter Squat  - 1 x daily - 3-4 x weekly - 3 sets - 15-20s hold - Kettlebell Squat  - 1 x daily - 3-4 x weekly - 2-3 sets - 10-12 reps - Standing Shoulder Row with Anchored Resistance  - 1 x daily - 3-4 x weekly - 2-3 sets - 10-12 reps - Standing Shoulder Horizontal Abduction with Anchored Resistance  - 1 x daily - 3-4 x weekly - 2-3 sets - 10-12 reps   ASSESSMENT:  CLINICAL IMPRESSION: Continue PT POC focused on improving lower back pain and core stabilization. Today she had improved mid back pain with reduction in muscle spasms. PT focused on mid back postural strengthening using lat pull down and scapular rows to reduce reoccurrence of mid back spasms. Patient still with great carryover of squat form and functional strength being able to lift 40# KB. PT plans to continue improving postural strength, core stabilization and functional strength in remaining sessions. Updated HEP to include postural exercises for thoracic back pain. Based on today's performance, pt will continue to benefit from skilled PT in order to facilitate return to PLOF and improve QoL.   OBJECTIVE IMPAIRMENTS: decreased strength, impaired flexibility, and obesity.   ACTIVITY LIMITATIONS: carrying, lifting, bending, squatting, and caring for others  PARTICIPATION LIMITATIONS: cleaning, community activity, and yard work  PERSONAL FACTORS: 3+ comorbidities: Depression, anxiety and HTN are also affecting patient's functional outcome.   REHAB POTENTIAL: Good  CLINICAL DECISION MAKING: Stable/uncomplicated  EVALUATION COMPLEXITY: Low   GOALS: Goals reviewed with patient? No  SHORT TERM GOALS: Target date:  05/31/2023  Patient will demonstrate undestanding of home exercise plan by performing exercises correctly with evidence of good carry over with min to no verbal or tactile cues .   Baseline: NT 05/21/23: Able to perform independently  Goal status: ONGOING   2.  Patient will be able to demonstrate understanding of transverse abdominis activation by performing posterior pelvic tilt in sitting with no cueing and without holding breath.   Baseline: Able to perform posterior pelvic tilt but requires holding breath Goal status: MET     LONG TERM GOALS: Target date: 09/26/2023  Patient will demonstrate an improvement in SF-36 score by >=10% indicating a decrease in disability and improvement in overall function.  Baseline:  SF-36 -Physical Functioning: 20% -Role Limitations due to physical health: 50% -Role Limitations due to emotional health: 33% -Energy/fatigue: 10% -Emotional well being: 32% -Social Functioning: 13% -Pain: 58% -General Health: 40% -Health Change: 50%  07/10/2023: Physical functioning: 45 % Role limitations due to physical health: 25 % Role limitations due to emotional problems: 33.3 % Energy/fatigue: 25 % Emotional well-being: 28 % Social functioning: 37.5 %  Pain: 45 % General health: 30 % Health change: 25 %  Goal status: ONGOING   2.  Patient will improve abdominal strength by 1 level on Sahrmann test as evidence of improved core stability and mitigation of diastasis recti.  Baseline: Level 4; 05/27/205: Level 5 Goal status: ONGOING   3.  Patient will demonstrate understanding of how to utilize core stability and proper hip hinge technique while lifting objects that weight similarly to her 54 and 47 year old children or >=40 lbs from floor into standing.  Baseline: #40 KB lift increased lordosis with dead lift; Jul 31, 2023: 40# KB with proper form Goal status: MET    4.  Patient will improve hip strength by >=1/3  grade (ie 4 to 4-) for improved lumbar and  pelvic stability   Baseline: Hip Ext R/L 4-/4-, Hip Abd R/L 4/4, Hip Add R/L 4-/4-; 2023-07-31: Hip Abd R/L 5/4+; Hip Add R/L 4+/4+; Hip Ext R/L 4+/4+  Goal status: MET     PLAN:  PT FREQUENCY: 1-2x/week  PT DURATION: 6 weeks  PLANNED INTERVENTIONS: 97164- PT Re-evaluation, 97110-Therapeutic exercises, 97530- Therapeutic activity, 97112- Neuromuscular re-education, 97535- Self Care, 02859- Manual therapy, [phone]- Gait training, [phone]- Orthotic Fit/training, [phone]- Aquatic Therapy, Patient/Family education, Balance training, Stair training, Dry Needling, Joint mobilization, Joint manipulation, Spinal manipulation, Spinal mobilization, Vestibular training, DME instructions, Cryotherapy, and Moist heat.  PLAN FOR NEXT SESSION:  Progress functional strength and core stability training.  Continue training hip hinge for improved biomechanics when lifting. TA activation with diaphragmatic breathing.   Lonni Pall PT, DPT [facility] [address], [place], [address] Phone: [phone]   Fax:  [phone]

## 2023-08-29 ENCOUNTER — Ambulatory Visit

## 2023-08-29 DIAGNOSIS — M5459 Other low back pain: Secondary | ICD-10-CM

## 2023-08-29 DIAGNOSIS — M546 Pain in thoracic spine: Secondary | ICD-10-CM | POA: Diagnosis not present

## 2023-08-29 DIAGNOSIS — M6281 Muscle weakness (generalized): Secondary | ICD-10-CM | POA: Diagnosis not present

## 2023-08-29 DIAGNOSIS — M6208 Separation of muscle (nontraumatic), other site: Secondary | ICD-10-CM

## 2023-08-29 NOTE — Therapy (Signed)
 OUTPATIENT PHYSICAL THERAPY THORACOLUMBAR TREATMENT  Patient Name: Alexandra Heath MRN: [id number] DOB:05/16/1980, 43 y.o., female Today's Date: 08/29/2023  END OF SESSION:      PT End of Session - 08/29/23 0943     Visit Number 18    Number of Visits 28    Date for PT Re-Evaluation 09/26/23    Authorization Type [place] [employer] [id number] for 12 PT vsts    Authorization Time Period 6/16-8/16    Authorization - Visit Number 7    Authorization - Number of Visits 12    Progress Note Due on Visit 20    PT Start Time 0945    PT Stop Time 1025    PT Time Calculation (min) 40 min    Activity Tolerance Patient tolerated treatment well    Behavior During Therapy [facility] for tasks assessed/performed          Past Medical History:  Diagnosis Date   Epilepsy (HCC)    Fibroids 05/14/2015   Rapid heart beat    Scoliosis    Scoliosis    Tachycardia    Past Surgical History:  Procedure Laterality Date   CHOLECYSTECTOMY     GALLBLADDER SURGERY     TUBAL LIGATION N/A 12/02/2020   Procedure: POST PARTUM TUBAL LIGATION;  Surgeon: Lovetta Debby PARAS, MD;  Location: [facility] ORS;  Service: Gynecology;  Laterality: N/A;   Patient Active Problem List   Diagnosis Date Noted   NSVD (normal spontaneous vaginal delivery) 12/03/2020   S/P tubal ligation 12/03/2020   Elevated blood-pressure reading without diagnosis of hypertension 12/26/2019   Encounter for supervision of high risk pregnancy due to advanced maternal age in primigravida 07/03/2019   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 Juliane Costa   REFERRING PROVIDER: Dr. Henriette Pierre    REFERRING DIAG: M62.08 (ICD-10-CM) - Separation of muscle (nontraumatic), other site  Rationale for Evaluation and Treatment: Rehabilitation  THERAPY DIAG:  Diastasis recti  Muscle weakness (generalized)  Other low back pain  Pain in thoracic spine  ONSET DATE: 10+ years ago   SUBJECTIVE:                                                                                                                                                                                           SUBJECTIVE STATEMENT: Patient  reports 0/10 NPS in the upper back and lower back at start of the session. She reports some soreness in the upper back as expected from additional upper back activities. . No further questions or concerns.   PERTINENT HISTORY:  Pt started experiencing diastasis recti around her third child in 2010. Since then she has been managing it on her own. She also has a umbilical hernia that was confirmed by Dr. Henriette Pierre on March 19th, 2025. He gave her permission to initiate PT before exploring further interventions. She has not experienced increased pain or discomfort because of the hernia or diastasis recti.   PAIN:  Are you having pain? No  PRECAUTIONS: Avoid positions that exacerbate dooming in area inside diastasis recti like crunches or leg lifts.   RED FLAGS: None   WEIGHT BEARING RESTRICTIONS: No  FALLS:  Has patient fallen in last 6 months? No  LIVING ENVIRONMENT: Lives with: lives with their family Lives in: Did not ask  Stairs: Did not ask  Has following equipment at home: None  OCCUPATION: Did not ask   PLOF: Independent  PATIENT GOALS: To reduce diastasis recti    NEXT MD VISIT: Not sure   OBJECTIVE:  Note: Objective measures were completed at Evaluation unless otherwise noted.  VITALS BP 118/74 HR 80 SpO2 100   DIAGNOSTIC FINDINGS:  None listed   PATIENT SURVEYS:  None performed   COGNITION: Overall cognitive status: Within functional limits for  tasks assessed     SENSATION: WFL  MUSCLE LENGTH: Hamstrings: Right NT deg; Left NT deg Thomas test: Not tested         Ely's Test: Positive Bilaterally   POSTURE: No Significant postural limitations  PALPATION: No areas tender to palpate    LOWER EXTREMITY MMT:    MMT Right eval Left eval  Hip flexion 4+ 4+  Hip extension 4- 4-  Hip abduction 4 4  Hip adduction 4- 4-  Hip internal rotation    Hip external rotation    Knee flexion 4+ 4+  Knee extension 4+ 4+  Ankle dorsiflexion    Ankle plantarflexion    Ankle inversion    Ankle eversion     (Blank rows = not tested)   FUNCTIONAL TESTS:  Sahrmann Test: Level 4     TREATMENT DATE: 08/29/2023   Therapeutic Exercise:   Supine Single UE/LE Extension from 90-90 for core stabilization    2 x 10, UE holding 2Kg Med ball    2 x 10, UE holding 3 Kg MB   Modified [employer] using weighted Dowel    3 x 10, 8# Weighted Dowel in BUE    Modified [employer] using 3 Kg Med Ball on edge of mat table   3 x 20 alternating R/L     Tall Kneeling Kettle Bell Torso Passes    1 x 20, CW Direction, 20# KB   1 x 20, CCW Dir, 20# KB    1 x 20, CW dir, 20 # KB     [employer] Machine:    Seated Lat Pull Down     3 x 10 25#     Seated Shoulder Row (Wide Grip)     3 x 10, 25#       P[place] Press     Resistance from R: 2 x 10     Resistance from L: 2 x 10     Seated Thoracic Barrel Stretch for T Spine mobility    2  x 10, Pt hugging Yellow Swiss    Therapeutic Activity:   PATIENT EDUCATION:  Education details: Form and technique for appropriate exercise, HEP  Person educated: Patient Education method: Explanation, Demonstration, Verbal cues, and Handouts Education comprehension: verbalized understanding, returned demonstration, and verbal cues required  HOME EXERCISE PROGRAM: Access Code: [email or web address] URL: [email or web address] Date: 08/27/2023 Prepared by: Lonni Pall  Exercises - Supine  Bridge with Resistance Band  - 1 x daily - 3-4 x weekly - 2-3 sets - 10-12 reps - 2s hold - Alternating Single Leg Bridge  - 1 x daily - 3-4 x weekly - 2-3 sets - 10-12 reps - Mini Squat  - 1 x daily - 3-4 x weekly - 2-3 sets - 10-12 reps - Supine Dead Bug with Leg Extension  - 1 x daily - 3-4 x weekly - 2-3 sets - 10-12 reps - Wall Quarter Squat  - 1 x daily - 3-4 x weekly - 3 sets - 15-20s hold - Kettlebell Squat  - 1 x daily - 3-4 x weekly - 2-3 sets - 10-12 reps - Standing Shoulder Row with Anchored Resistance  - 1 x daily - 3-4 x weekly - 2-3 sets - 10-12 reps - Standing Shoulder Horizontal Abduction with Anchored Resistance  - 1 x daily - 3-4 x weekly - 2-3 sets - 10-12 reps   ASSESSMENT:  CLINICAL IMPRESSION: Continue PT POC focused on improving lower back pain and core stabilization. Patient tolerated increased intensity and repetitions with core stabilization exercises today. PT continued mid back/postural strengthening in order to reduce reoccurrence of muscle spasms. Notable tightness with thoracic rotation stretch to the R which improved with additional repetitions. Next session PT to focus on functional strengthening, glute strength and dynamic activities that simulate household ADLs. Based on today's performance, pt will continue to benefit from skilled PT in order to facilitate return to PLOF and improve QoL.   OBJECTIVE IMPAIRMENTS: decreased strength, impaired flexibility, and obesity.   ACTIVITY LIMITATIONS: carrying, lifting, bending, squatting, and caring for others  PARTICIPATION LIMITATIONS: cleaning, community activity, and yard work  PERSONAL FACTORS: 3+ comorbidities: Depression, anxiety and HTN are also affecting patient's functional outcome.   REHAB POTENTIAL: Good  CLINICAL DECISION MAKING: Stable/uncomplicated  EVALUATION COMPLEXITY: Low   GOALS: Goals reviewed with patient? No  SHORT TERM GOALS: Target date: 05/31/2023  Patient will demonstrate  undestanding of home exercise plan by performing exercises correctly with evidence of good carry over with min to no verbal or tactile cues .   Baseline: NT 05/21/23: Able to perform independently  Goal status: ONGOING   2.  Patient will be able to demonstrate understanding of transverse abdominis activation by performing posterior pelvic tilt in sitting with no cueing and without holding breath.   Baseline: Able to perform posterior pelvic tilt but requires holding breath Goal status: MET     LONG TERM GOALS: Target date: 09/26/2023  Patient will demonstrate an improvement in SF-36 score by >=10% indicating a decrease in disability and improvement in overall function.  Baseline:  SF-36 -Physical Functioning: 20% -Role Limitations due to physical health: 50% -Role Limitations due to emotional health: 33% -Energy/fatigue: 10% -Emotional well being: 32% -Social Functioning: 13% -Pain: 58% -General Health: 40% -Health Change: 50%  07/10/2023: Physical functioning: 45 % Role limitations due to physical health: 25 % Role limitations due to emotional problems: 33.3 % Energy/fatigue: 25 % Emotional well-being: 28 % Social functioning: 37.5 % Pain: 45 % General health: 30 % Health change: 25 %  Goal status: ONGOING   2.  Patient will improve abdominal strength by 1 level on Sahrmann test as evidence of improved core stability and mitigation of diastasis recti.  Baseline: Level 4; 05/27/205: Level 5 Goal status: ONGOING   3.  Patient will demonstrate understanding of how to utilize core stability and proper hip hinge technique while lifting objects that weight similarly to her 44 and 75 year old children or >=40 lbs from floor into standing.  Baseline: #40 KB lift increased lordosis with dead lift; 08/07/2023: 40# KB with proper form Goal status: MET    4.  Patient will improve hip strength by >=1/3  grade (ie 4 to 4-) for improved lumbar and pelvic stability   Baseline: Hip Ext R/L  4-/4-, Hip Abd R/L 4/4, Hip Add R/L 4-/4-; August 07, 2023: Hip Abd R/L 5/4+; Hip Add R/L 4+/4+; Hip Ext R/L 4+/4+  Goal status: MET     PLAN:  PT FREQUENCY: 1-2x/week  PT DURATION: 6 weeks  PLANNED INTERVENTIONS: 97164- PT Re-evaluation, 97110-Therapeutic exercises, 97530- Therapeutic activity, 97112- Neuromuscular re-education, 97535- Self Care, 02859- Manual therapy, [phone]- Gait training, [phone]- Orthotic Fit/training, [phone]- Aquatic Therapy, Patient/Family education, Balance training, Stair training, Dry Needling, Joint mobilization, Joint manipulation, Spinal manipulation, Spinal mobilization, Vestibular training, DME instructions, Cryotherapy, and Moist heat.  PLAN FOR NEXT SESSION:  Progress core stabilization exercises, functional strength (Sit to stand transfers, squatting and lifting), Thoracic mobility/strengthening     Lonni Pall PT, DPT [facility] [address] [place], [address] Phone: [phone]   Fax:  [phone]

## 2023-08-30 ENCOUNTER — Ambulatory Visit

## 2023-08-30 DIAGNOSIS — M6208 Separation of muscle (nontraumatic), other site: Secondary | ICD-10-CM

## 2023-08-30 DIAGNOSIS — M546 Pain in thoracic spine: Secondary | ICD-10-CM | POA: Diagnosis not present

## 2023-08-30 DIAGNOSIS — M6281 Muscle weakness (generalized): Secondary | ICD-10-CM

## 2023-08-30 DIAGNOSIS — M5459 Other low back pain: Secondary | ICD-10-CM | POA: Diagnosis not present

## 2023-08-30 NOTE — Therapy (Signed)
 OUTPATIENT PHYSICAL THERAPY THORACOLUMBAR TREATMENT  Patient Name: Alexandra Heath MRN: [id number] DOB:March 06, 1980, 43 y.o., female Today's Date: 08/30/2023  END OF SESSION:      PT End of Session - 08/30/23 1042     Visit Number 19    Number of Visits 28    Date for PT Re-Evaluation 09/26/23    Authorization Type [employer] [id number] for 12 PT vsts    Authorization Time Period 6/16-8/16    Authorization - Number of Visits 12    Progress Note Due on Visit 20    PT Start Time 1040    PT Stop Time 1115    PT Time Calculation (min) 35 min    Activity Tolerance Patient tolerated treatment well    Behavior During Therapy [facility] for tasks assessed/performed          Past Medical History:  Diagnosis Date   Epilepsy (HCC)    Fibroids 05/14/2015   Rapid heart beat    Scoliosis    Scoliosis    Tachycardia    Past Surgical History:  Procedure Laterality Date   CHOLECYSTECTOMY     GALLBLADDER SURGERY     TUBAL LIGATION N/A 12/02/2020   Procedure: POST PARTUM TUBAL LIGATION;  Surgeon: Lovetta Debby PARAS, MD;  Location: [facility] ORS;  Service: Gynecology;  Laterality: N/A;   Patient Active Problem List   Diagnosis Date Noted   NSVD (normal spontaneous vaginal delivery) 12/03/2020   S/P tubal ligation 12/03/2020   Elevated blood-pressure reading without diagnosis of hypertension 12/26/2019   Encounter for supervision of high risk pregnancy due to advanced maternal age in primigravida 07/03/2019   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 Juliane Costa   REFERRING PROVIDER: Dr. Henriette Pierre   REFERRING DIAG: M62.08  (ICD-10-CM) - Separation of muscle (nontraumatic), other site  Rationale for Evaluation and Treatment: Rehabilitation  THERAPY DIAG:  Diastasis recti  Muscle weakness (generalized)  Other low back pain  Pain in thoracic spine  ONSET DATE: 10+ years ago   SUBJECTIVE:                                                                                                                                                                                           SUBJECTIVE STATEMENT: Patient reports 0/10 NPS in the upper back and  lower back at start of the session. Arrived late to session because she had to pick up her son ac . No further questions or concerns.   PERTINENT HISTORY:  Pt started experiencing diastasis recti around her third child in 2010. Since then she has been managing it on her own. She also has a umbilical hernia that was confirmed by Dr. Henriette Pierre on March 19th, 2025. He gave her permission to initiate PT before exploring further interventions. She has not experienced increased pain or discomfort because of the hernia or diastasis recti.   PAIN:  Are you having pain? No  PRECAUTIONS: Avoid positions that exacerbate dooming in area inside diastasis recti like crunches or leg lifts.   RED FLAGS: None   WEIGHT BEARING RESTRICTIONS: No  FALLS:  Has patient fallen in last 6 months? No  LIVING ENVIRONMENT: Lives with: lives with their family Lives in: Did not ask  Stairs: Did not ask  Has following equipment at home: None  OCCUPATION: Did not ask   PLOF: Independent  PATIENT GOALS: To reduce diastasis recti    NEXT MD VISIT: Not sure   OBJECTIVE:  Note: Objective measures were completed at Evaluation unless otherwise noted.  VITALS BP 118/74 HR 80 SpO2 100   DIAGNOSTIC FINDINGS:  None listed   PATIENT SURVEYS:  None performed   COGNITION: Overall cognitive status: Within functional limits for tasks assessed     SENSATION: WFL  MUSCLE  LENGTH: Hamstrings: Right NT deg; Left NT deg Thomas test: Not tested         Ely's Test: Positive Bilaterally   POSTURE: No Significant postural limitations  PALPATION: No areas tender to palpate    LOWER EXTREMITY MMT:    MMT Right eval Left eval  Hip flexion 4+ 4+  Hip extension 4- 4-  Hip abduction 4 4  Hip adduction 4- 4-  Hip internal rotation    Hip external rotation    Knee flexion 4+ 4+  Knee extension 4+ 4+  Ankle dorsiflexion    Ankle plantarflexion    Ankle inversion    Ankle eversion     (Blank rows = not tested)   FUNCTIONAL TESTS:  Sahrmann Test: Level 4     TREATMENT DATE: 08/30/2023   Therapeutic Exercise:   Supine Single RUE/LLE Extension from 90-90 for core stabilization    3 x 10, 5# AW on ea extremity    Modified [facility] for core stabilization (Double 90-90 to double LE extension)   2 x 10    Modified [place] Twist with med ball    2 x 20, 3Kg MB  Therapeutic Activity:    Debe Edison Squats for improvements in squatting tasks   2 x 10, 20# KB   1 x 10, 30# KB  - Good carry over with proper biomechanics, no lumbar pain or abdominal pain    Debe Edison Swings for core stabilization with dynamic movements including bending and reaching   1 x 10, 10# KB, seated rest break in b/t sets    - Reviewed form with velocity and hip extension    2 x 10, 10# KB    - Improved velocity and hip extension with following sets    Suitcase Carry with KB for carrying heavy objects    2 x 25' - 20# KB  1 x 25' - 30# KB      Weighted box carry from mat table to dumbbell rack and back (25')   Unloaded DB  each lap    Lap 1 - 26# in box   Lap 2 - 21# in box   Lap 3 - 14# in box   Lap 4 - 8# in box  PATIENT EDUCATION:  Education details: Form and technique for appropriate exercise, HEP  Person educated: Patient Education method: Explanation, Demonstration, Verbal cues, and Handouts Education comprehension: verbalized understanding, returned  demonstration, and verbal cues required  HOME EXERCISE PROGRAM: Access Code: [email or web address] URL: [email or web address] Date: 08/27/2023 Prepared by: Lonni Pall  Exercises - Supine Bridge with Resistance Band  - 1 x daily - 3-4 x weekly - 2-3 sets - 10-12 reps - 2s hold - Alternating Single Leg Bridge  - 1 x daily - 3-4 x weekly - 2-3 sets - 10-12 reps - Mini Squat  - 1 x daily - 3-4 x weekly - 2-3 sets - 10-12 reps - Supine Dead Bug with Leg Extension  - 1 x daily - 3-4 x weekly - 2-3 sets - 10-12 reps - Wall Quarter Squat  - 1 x daily - 3-4 x weekly - 3 sets - 15-20s hold - Kettlebell Squat  - 1 x daily - 3-4 x weekly - 2-3 sets - 10-12 reps - Standing Shoulder Row with Anchored Resistance  - 1 x daily - 3-4 x weekly - 2-3 sets - 10-12 reps - Standing Shoulder Horizontal Abduction with Anchored Resistance  - 1 x daily - 3-4 x weekly - 2-3 sets - 10-12 reps   ASSESSMENT:  CLINICAL IMPRESSION: Continue PT POC focused on improving lower back pain and core stabilization. Session limited due to late arrival. Today's session focused on functional strengthening and increasing intensity with core stabilization. Great demonstration of proper squatting and lifting form with weighted box carry and kettle bell lifts. Suitcase carry without lateral leaning or abdominal pain. Notable doming with the supine exercises focused on core strength but no additional pain endorsed. PT will continue to strengthen LE and core as tolerated. Based on today's performance, pt will continue to benefit from skilled PT in order to facilitate return to PLOF and improve QoL.   OBJECTIVE IMPAIRMENTS: decreased strength, impaired flexibility, and obesity.   ACTIVITY LIMITATIONS: carrying, lifting, bending, squatting, and caring for others  PARTICIPATION LIMITATIONS: cleaning, community activity, and yard work  PERSONAL FACTORS: 3+ comorbidities: Depression, anxiety and HTN are also affecting patient's  functional outcome.   REHAB POTENTIAL: Good  CLINICAL DECISION MAKING: Stable/uncomplicated  EVALUATION COMPLEXITY: Low   GOALS: Goals reviewed with patient? No  SHORT TERM GOALS: Target date: 05/31/2023  Patient will demonstrate undestanding of home exercise plan by performing exercises correctly with evidence of good carry over with min to no verbal or tactile cues .   Baseline: NT 05/21/23: Able to perform independently  Goal status: ONGOING   2.  Patient will be able to demonstrate understanding of transverse abdominis activation by performing posterior pelvic tilt in sitting with no cueing and without holding breath.   Baseline: Able to perform posterior pelvic tilt but requires holding breath Goal status: MET     LONG TERM GOALS: Target date: 09/26/2023  Patient will demonstrate an improvement in SF-36 score by >=10% indicating a decrease in disability and improvement in overall function.  Baseline:  SF-36 -Physical Functioning: 20% -Role Limitations due to physical health: 50% -Role Limitations due to emotional health: 33% -Energy/fatigue: 10% -Emotional well being: 32% -Social Functioning: 13% -Pain: 58% -General Health: 40% -Health Change: 50%  07/10/2023: Physical functioning: 45 % Role limitations  due to physical health: 25 % Role limitations due to emotional problems: 33.3 % Energy/fatigue: 25 % Emotional well-being: 28 % Social functioning: 37.5 % Pain: 45 % General health: 30 % Health change: 25 %  Goal status: ONGOING   2.  Patient will improve abdominal strength by 1 level on Sahrmann test as evidence of improved core stability and mitigation of diastasis recti.  Baseline: Level 4; 05/27/205: Level 5 Goal status: ONGOING   3.  Patient will demonstrate understanding of how to utilize core stability and proper hip hinge technique while lifting objects that weight similarly to her 61 and 87 year old children or >=40 lbs from floor into standing.   Baseline: #40 KB lift increased lordosis with dead lift; Jul 31, 2023: 40# KB with proper form Goal status: MET    4.  Patient will improve hip strength by >=1/3  grade (ie 4 to 4-) for improved lumbar and pelvic stability   Baseline: Hip Ext R/L 4-/4-, Hip Abd R/L 4/4, Hip Add R/L 4-/4-; 2023/07/31: Hip Abd R/L 5/4+; Hip Add R/L 4+/4+; Hip Ext R/L 4+/4+  Goal status: MET     PLAN:  PT FREQUENCY: 1-2x/week  PT DURATION: 6 weeks  PLANNED INTERVENTIONS: 97164- PT Re-evaluation, 97110-Therapeutic exercises, 97530- Therapeutic activity, 97112- Neuromuscular re-education, 97535- Self Care, [id number]- Manual therapy, [phone]- Gait training, [phone]- Orthotic Fit/training, [phone]- Aquatic Therapy, Patient/Family education, Balance training, Stair training, Dry Needling, Joint mobilization, Joint manipulation, Spinal manipulation, Spinal mobilization, Vestibular training, DME instructions, Cryotherapy, and Moist heat.  PLAN FOR NEXT SESSION:  Progress core stabilization exercises, functional strength (Sit to stand transfers, squatting and lifting), Thoracic mobility/strengthening     Lonni Pall PT, DPT [facility] [address] [place], [address] Phone: [phone]   Fax:  [phone]

## 2023-09-05 ENCOUNTER — Ambulatory Visit

## 2023-09-10 ENCOUNTER — Ambulatory Visit

## 2023-09-12 ENCOUNTER — Ambulatory Visit

## 2023-09-17 ENCOUNTER — Ambulatory Visit

## 2023-11-01 DIAGNOSIS — R7309 Other abnormal glucose: Secondary | ICD-10-CM | POA: Diagnosis not present

## 2023-11-01 DIAGNOSIS — E782 Mixed hyperlipidemia: Secondary | ICD-10-CM | POA: Diagnosis not present

## 2023-11-01 DIAGNOSIS — Z79899 Other long term (current) drug therapy: Secondary | ICD-10-CM | POA: Diagnosis not present

## 2023-11-08 DIAGNOSIS — N183 Chronic kidney disease, stage 3 unspecified: Secondary | ICD-10-CM | POA: Diagnosis not present

## 2023-11-08 DIAGNOSIS — R7309 Other abnormal glucose: Secondary | ICD-10-CM | POA: Diagnosis not present

## 2023-11-08 DIAGNOSIS — Z79899 Other long term (current) drug therapy: Secondary | ICD-10-CM | POA: Diagnosis not present

## 2023-11-08 DIAGNOSIS — E039 Hypothyroidism, unspecified: Secondary | ICD-10-CM | POA: Diagnosis not present

## 2023-11-08 DIAGNOSIS — E782 Mixed hyperlipidemia: Secondary | ICD-10-CM | POA: Diagnosis not present

## 2023-11-08 DIAGNOSIS — R002 Palpitations: Secondary | ICD-10-CM | POA: Diagnosis not present

## 2023-12-24 DIAGNOSIS — G40309 Generalized idiopathic epilepsy and epileptic syndromes, not intractable, without status epilepticus: Secondary | ICD-10-CM | POA: Diagnosis not present

## 2023-12-24 DIAGNOSIS — G43019 Migraine without aura, intractable, without status migrainosus: Secondary | ICD-10-CM | POA: Diagnosis not present

## 2023-12-24 DIAGNOSIS — Z79899 Other long term (current) drug therapy: Secondary | ICD-10-CM | POA: Diagnosis not present

## 2024-02-01 DIAGNOSIS — J101 Influenza due to other identified influenza virus with other respiratory manifestations: Secondary | ICD-10-CM | POA: Diagnosis not present

## 2024-02-01 DIAGNOSIS — R051 Acute cough: Secondary | ICD-10-CM | POA: Diagnosis not present

## 2024-02-01 DIAGNOSIS — M791 Myalgia, unspecified site: Secondary | ICD-10-CM | POA: Diagnosis not present

## 2024-02-11 DIAGNOSIS — Z79899 Other long term (current) drug therapy: Secondary | ICD-10-CM | POA: Diagnosis not present

## 2024-02-11 DIAGNOSIS — R7309 Other abnormal glucose: Secondary | ICD-10-CM | POA: Diagnosis not present

## 2024-02-11 DIAGNOSIS — E782 Mixed hyperlipidemia: Secondary | ICD-10-CM | POA: Diagnosis not present

## 2024-02-12 DIAGNOSIS — R7309 Other abnormal glucose: Secondary | ICD-10-CM | POA: Diagnosis not present

## 2024-02-12 DIAGNOSIS — R42 Dizziness and giddiness: Secondary | ICD-10-CM | POA: Diagnosis not present

## 2024-02-12 DIAGNOSIS — R002 Palpitations: Secondary | ICD-10-CM | POA: Diagnosis not present

## 2024-02-12 DIAGNOSIS — E039 Hypothyroidism, unspecified: Secondary | ICD-10-CM | POA: Diagnosis not present

## 2024-02-12 DIAGNOSIS — Z79899 Other long term (current) drug therapy: Secondary | ICD-10-CM | POA: Diagnosis not present

## 2024-02-12 DIAGNOSIS — E782 Mixed hyperlipidemia: Secondary | ICD-10-CM | POA: Diagnosis not present

## 2024-02-12 DIAGNOSIS — N183 Chronic kidney disease, stage 3 unspecified: Secondary | ICD-10-CM | POA: Diagnosis not present

## 2024-02-12 DIAGNOSIS — G40309 Generalized idiopathic epilepsy and epileptic syndromes, not intractable, without status epilepticus: Secondary | ICD-10-CM | POA: Diagnosis not present
